# Patient Record
Sex: Female | Born: 1975 | Race: Black or African American | Hispanic: No | Marital: Single | Smoking: Never smoker
Health system: Southern US, Community
[De-identification: ages and names within clinical notes are randomized; demographics above are authoritative.]

## PROBLEM LIST (undated history)

## (undated) DIAGNOSIS — J45909 Unspecified asthma, uncomplicated: Secondary | ICD-10-CM

## (undated) DIAGNOSIS — M5430 Sciatica, unspecified side: Secondary | ICD-10-CM

## (undated) HISTORY — DX: Sciatica, unspecified side: M54.30

## (undated) HISTORY — PX: ABDOMINAL HYSTERECTOMY: SHX81

## (undated) HISTORY — PX: TONSILECTOMY/ADENOIDECTOMY WITH MYRINGOTOMY: SHX6125

## (undated) HISTORY — PX: DILATION AND CURETTAGE OF UTERUS: SHX78

## (undated) HISTORY — DX: Unspecified asthma, uncomplicated: J45.909

---

## 1998-03-04 HISTORY — PX: REDUCTION MAMMAPLASTY: SUR839

## 1998-03-04 HISTORY — PX: BREAST SURGERY: SHX581

## 2007-01-01 ENCOUNTER — Ambulatory Visit: Payer: Self-pay | Admitting: Internal Medicine

## 2007-03-04 ENCOUNTER — Emergency Department: Payer: Self-pay | Admitting: Emergency Medicine

## 2011-02-23 ENCOUNTER — Inpatient Hospital Stay: Payer: Self-pay

## 2011-05-01 LAB — HM PAP SMEAR: HM Pap smear: NORMAL

## 2012-04-30 ENCOUNTER — Ambulatory Visit (INDEPENDENT_AMBULATORY_CARE_PROVIDER_SITE_OTHER): Payer: BC Managed Care – PPO | Admitting: Internal Medicine

## 2012-04-30 ENCOUNTER — Encounter: Payer: Self-pay | Admitting: Internal Medicine

## 2012-04-30 VITALS — BP 110/76 | HR 68 | Temp 97.9°F | Ht 67.25 in | Wt 222.0 lb

## 2012-04-30 DIAGNOSIS — E669 Obesity, unspecified: Secondary | ICD-10-CM

## 2012-04-30 MED ORDER — PHENTERMINE HCL 37.5 MG PO CAPS: 37.5000 mg | ORAL_CAPSULE | ORAL | Status: DC

## 2012-04-30 NOTE — Assessment & Plan Note (Signed)
Body mass index is 34.52 kg/(m^2).  BMI 34. Discussed keeping a food diary. Discussed continuing with exercise.  Will start phentermine to help with appetite suppression. Discussed potential side effects of this medication. Goal weight loss 0.5-1lb per week. Follow up in 1 month.

## 2012-04-30 NOTE — Patient Instructions (Signed)
[email or web address] - app for weight loss

## 2012-04-30 NOTE — Progress Notes (Signed)
Subjective:    Patient ID: Alexis Suarez, female    DOB: 07-Feb-1976, 37 y.o.   MRN: [id number]  HPI 36YO female with h/o obesity presents to establish care. Doing well. Trying to follow healthy diet. Exercising 5 times per week with cardio and personal training. Was previously using phentermine with some improvement in appetite.  Otherwise well. Had some BRB on toilet paper a few months ago, which she attributed to hemorrhoid after pregnancy. No further bleeding, abdominal pain, or change in bowel habits.  Outpatient Encounter Prescriptions as of 04/30/2012  Medication Sig Dispense Refill  . Norgestimate-Ethinyl Estradiol Triphasic (ORTHO TRI-CYCLEN LO) 0.18/0.215/0.25 MG-25 MCG tab Take 1 tablet by mouth daily.      . phentermine 37.5 MG capsule Take 1 capsule (37.5 mg total) by mouth every morning.  30 capsule  1   No facility-administered encounter medications on file as of 04/30/2012.   BP 110/76  Pulse 68  Temp(Src) 97.9 F (36.6 C) (Oral)  Ht 5' 7.25" (1.708 m)  Wt 222 lb (100.699 kg)  BMI 34.52 kg/m2  SpO2 98%  LMP 04/09/2012  Review of Systems  Constitutional: Negative for fever, chills, appetite change, fatigue and unexpected weight change.  HENT: Negative for ear pain, congestion, sore throat, trouble swallowing, neck pain, voice change and sinus pressure.   Eyes: Negative for visual disturbance.  Respiratory: Negative for cough, shortness of breath, wheezing and stridor.   Cardiovascular: Negative for chest pain, palpitations and leg swelling.  Gastrointestinal: Negative for nausea, vomiting, abdominal pain, diarrhea, constipation, blood in stool, abdominal distention and anal bleeding.  Genitourinary: Negative for dysuria and flank pain.  Musculoskeletal: Negative for myalgias, arthralgias and gait problem.  Skin: Negative for color change and rash.  Neurological: Negative for dizziness and headaches.  Hematological: Negative for adenopathy. Does not bruise/bleed easily.   Psychiatric/Behavioral: Negative for suicidal ideas, sleep disturbance and dysphoric mood. The patient is not nervous/anxious.        Objective:   Physical Exam  Constitutional: She is oriented to person, place, and time. She appears well-developed and well-nourished. No distress.  HENT:  Head: Normocephalic and atraumatic.  Right Ear: External ear normal.  Left Ear: External ear normal.  Nose: Nose normal.  Mouth/Throat: Oropharynx is clear and moist. No oropharyngeal exudate.  Eyes: Conjunctivae are normal. Pupils are equal, round, and reactive to light. Right eye exhibits no discharge. Left eye exhibits no discharge. No scleral icterus.  Neck: Normal range of motion. Neck supple. No tracheal deviation present. No thyromegaly present.  Cardiovascular: Normal rate, regular rhythm, normal heart sounds and intact distal pulses.  Exam reveals no gallop and no friction rub.   No murmur heard. Pulmonary/Chest: Effort normal and breath sounds normal. No respiratory distress. She has no wheezes. She has no rales. She exhibits no tenderness.  Abdominal: Soft. Bowel sounds are normal. She exhibits no distension and no mass. There is no tenderness. There is no rebound and no guarding.  Musculoskeletal: Normal range of motion. She exhibits no edema and no tenderness.  Lymphadenopathy:    She has no cervical adenopathy.  Neurological: She is alert and oriented to person, place, and time. No cranial nerve deficit. She exhibits normal muscle tone. Coordination normal.  Skin: Skin is warm and dry. No rash noted. She is not diaphoretic. No erythema. No pallor.  Psychiatric: She has a normal mood and affect. Her behavior is normal. Judgment and thought content normal.          Assessment & Plan:

## 2012-10-11 ENCOUNTER — Emergency Department: Payer: Self-pay | Admitting: Internal Medicine

## 2012-10-11 LAB — URINALYSIS, COMPLETE
Bacteria: NONE SEEN
Bilirubin,UR: NEGATIVE
Glucose,UR: NEGATIVE mg/dL (ref 0–75)
Ketone: NEGATIVE
Nitrite: NEGATIVE
Protein: NEGATIVE
Specific Gravity: 1.019 (ref 1.003–1.030)
Squamous Epithelial: 6

## 2012-10-11 LAB — COMPREHENSIVE METABOLIC PANEL
Anion Gap: 3 — ABNORMAL LOW (ref 7–16)
Bilirubin,Total: 0.3 mg/dL (ref 0.2–1.0)
Calcium, Total: 8.4 mg/dL — ABNORMAL LOW (ref 8.5–10.1)
EGFR (African American): >60
Glucose: 101 mg/dL — ABNORMAL HIGH (ref 65–99)
Total Protein: 7.6 g/dL (ref 6.4–8.2)

## 2012-10-11 LAB — CBC
HCT: 36.8 % (ref 35.0–47.0)
MCH: 27.6 pg (ref 26.0–34.0)
MCV: 81 fL (ref 80–100)
Platelet: 256 10*3/uL (ref 150–440)
WBC: 10.5 10*3/uL (ref 3.6–11.0)

## 2013-01-07 ENCOUNTER — Other Ambulatory Visit: Payer: Self-pay

## 2013-03-04 HISTORY — PX: ABDOMINAL HYSTERECTOMY: SHX81

## 2013-12-15 ENCOUNTER — Ambulatory Visit: Payer: Self-pay | Admitting: Nurse Practitioner

## 2014-01-07 DIAGNOSIS — K5900 Constipation, unspecified: Secondary | ICD-10-CM | POA: Insufficient documentation

## 2014-01-18 LAB — HM PAP SMEAR: HM Pap smear: NORMAL

## 2014-01-28 ENCOUNTER — Ambulatory Visit: Payer: Self-pay | Admitting: Certified Nurse Midwife

## 2014-01-28 LAB — HCG, QUANTITATIVE, PREGNANCY: BETA HCG, QUANT.: 9911 m[IU]/mL — AB

## 2014-02-01 ENCOUNTER — Emergency Department: Payer: Self-pay | Admitting: Emergency Medicine

## 2014-02-03 ENCOUNTER — Ambulatory Visit: Payer: Self-pay | Admitting: Obstetrics & Gynecology

## 2014-02-08 ENCOUNTER — Inpatient Hospital Stay: Payer: Self-pay | Admitting: Obstetrics & Gynecology

## 2014-02-08 LAB — CBC WITH DIFFERENTIAL/PLATELET
BASOS ABS: 0 10*3/uL (ref 0.0–0.1)
BASOS ABS: 0 10*3/uL (ref 0.0–0.1)
Basophil %: 0.4 %
Basophil %: 0.1 %
Eosinophil #: 0.3 10*3/uL (ref 0.0–0.7)
Eosinophil #: 0 10*3/uL (ref 0.0–0.7)
Eosinophil %: 3.3 %
Eosinophil %: 0 %
HCT: 24.1 % — ABNORMAL LOW (ref 35.0–47.0)
HCT: 29 % — AB (ref 35.0–47.0)
HGB: 7.7 g/dL — AB (ref 12.0–16.0)
HGB: 9.3 g/dL — AB (ref 12.0–16.0)
LYMPHS ABS: 1.6 10*3/uL (ref 1.0–3.6)
Lymphocyte #: 2.6 10*3/uL (ref 1.0–3.6)
Lymphocyte %: 33.2 %
Lymphocyte %: 4.8 %
MCH: 27.3 pg (ref 26.0–34.0)
MCH: 28.4 pg (ref 26.0–34.0)
MCHC: 31.7 g/dL — AB (ref 32.0–36.0)
MCHC: 32.1 g/dL (ref 32.0–36.0)
MCV: 86 fL (ref 80–100)
MCV: 88 fL (ref 80–100)
MONO ABS: 0.5 x10 3/mm (ref 0.2–0.9)
MONOS PCT: 6.4 %
Monocyte #: 1.8 x10 3/mm — ABNORMAL HIGH (ref 0.2–0.9)
Monocyte %: 5.4 %
NEUTROS ABS: 4.4 10*3/uL (ref 1.4–6.5)
NEUTROS PCT: 89.7 %
Neutrophil #: 30.1 10*3/uL — ABNORMAL HIGH (ref 1.4–6.5)
Neutrophil %: 56.7 %
PLATELETS: 169 10*3/uL (ref 150–440)
PLATELETS: 259 10*3/uL (ref 150–440)
RBC: 2.8 10*6/uL — ABNORMAL LOW (ref 3.80–5.20)
RBC: 3.28 10*6/uL — AB (ref 3.80–5.20)
RDW: 14 % (ref 11.5–14.5)
RDW: 14.3 % (ref 11.5–14.5)
WBC: 7.8 10*3/uL (ref 3.6–11.0)
WBC: 33.6 10*3/uL — ABNORMAL HIGH (ref 3.6–11.0)

## 2014-02-08 LAB — FIBRINOGEN: FIBRINOGEN: 218 mg/dL (ref 210–470)

## 2014-02-08 LAB — COMPREHENSIVE METABOLIC PANEL
ALBUMIN: 2.4 g/dL — AB (ref 3.4–5.0)
ALT: 16 U/L
ANION GAP: 8 (ref 7–16)
AST: 23 U/L (ref 15–37)
Alkaline Phosphatase: 59 U/L
BUN: 10 mg/dL (ref 7–18)
Bilirubin,Total: 1.3 mg/dL — ABNORMAL HIGH (ref 0.2–1.0)
CALCIUM: 7.6 mg/dL — AB (ref 8.5–10.1)
CHLORIDE: 105 mmol/L (ref 98–107)
CREATININE: 0.9 mg/dL (ref 0.60–1.30)
Co2: 26 mmol/L (ref 21–32)
Glucose: 158 mg/dL — ABNORMAL HIGH (ref 65–99)
Osmolality: 280 (ref 275–301)
Potassium: 5.5 mmol/L — ABNORMAL HIGH (ref 3.5–5.1)
Sodium: 139 mmol/L (ref 136–145)
Total Protein: 4.9 g/dL — ABNORMAL LOW (ref 6.4–8.2)

## 2014-02-08 LAB — BASIC METABOLIC PANEL
ANION GAP: 6 — AB (ref 7–16)
BUN: 8 mg/dL (ref 7–18)
CHLORIDE: 107 mmol/L (ref 98–107)
CO2: 24 mmol/L (ref 21–32)
Calcium, Total: 7.2 mg/dL — ABNORMAL LOW (ref 8.5–10.1)
Creatinine: 0.7 mg/dL (ref 0.60–1.30)
EGFR (African American): >60
EGFR (Non-African Amer.): >60
Glucose: 231 mg/dL — ABNORMAL HIGH (ref 65–99)
OSMOLALITY: 280 (ref 275–301)
POTASSIUM: 3.8 mmol/L (ref 3.5–5.1)
Sodium: 137 mmol/L (ref 136–145)

## 2014-02-08 LAB — HEMATOCRIT: HCT: 35.9 % (ref 35.0–47.0)

## 2014-02-08 LAB — PROTIME-INR
INR: 1.3
Prothrombin Time: 15.7 secs — ABNORMAL HIGH (ref 11.5–14.7)

## 2014-02-08 LAB — HEMOGLOBIN: HGB: 11.5 g/dL — ABNORMAL LOW (ref 12.0–16.0)

## 2014-02-09 LAB — CBC WITH DIFFERENTIAL/PLATELET
Basophil #: 0 10*3/uL (ref 0.0–0.1)
Basophil #: 0.1 10*3/uL (ref 0.0–0.1)
Basophil %: 0.1 %
Basophil %: 0.3 %
EOS ABS: 0 10*3/uL (ref 0.0–0.7)
EOS PCT: 0.1 %
Eosinophil #: 0 10*3/uL (ref 0.0–0.7)
Eosinophil %: 0.2 %
HCT: 21.6 % — ABNORMAL LOW (ref 35.0–47.0)
HCT: 26.1 % — ABNORMAL LOW (ref 35.0–47.0)
HGB: 7.2 g/dL — ABNORMAL LOW (ref 12.0–16.0)
HGB: 8.6 g/dL — AB (ref 12.0–16.0)
LYMPHS ABS: 2.4 10*3/uL (ref 1.0–3.6)
LYMPHS PCT: 13.6 %
Lymphocyte #: 1.7 10*3/uL (ref 1.0–3.6)
Lymphocyte %: 8.4 %
MCH: 29 pg (ref 26.0–34.0)
MCH: 29.1 pg (ref 26.0–34.0)
MCHC: 32.8 g/dL (ref 32.0–36.0)
MCHC: 33.5 g/dL (ref 32.0–36.0)
MCV: 87 fL (ref 80–100)
MCV: 89 fL (ref 80–100)
MONO ABS: 1.3 x10 3/mm — AB (ref 0.2–0.9)
Monocyte #: 1.4 x10 3/mm — ABNORMAL HIGH (ref 0.2–0.9)
Monocyte %: 6.5 %
Monocyte %: 7.9 %
NEUTROS ABS: 17.5 10*3/uL — AB (ref 1.4–6.5)
NEUTROS PCT: 78.2 %
Neutrophil #: 13.6 10*3/uL — ABNORMAL HIGH (ref 1.4–6.5)
Neutrophil %: 84.7 %
Platelet: 186 10*3/uL (ref 150–440)
Platelet: 224 10*3/uL (ref 150–440)
RBC: 2.49 10*6/uL — ABNORMAL LOW (ref 3.80–5.20)
RBC: 2.95 10*6/uL — ABNORMAL LOW (ref 3.80–5.20)
RDW: 14.5 % (ref 11.5–14.5)
RDW: 14.6 % — ABNORMAL HIGH (ref 11.5–14.5)
WBC: 17.4 10*3/uL — ABNORMAL HIGH (ref 3.6–11.0)
WBC: 20.6 10*3/uL — ABNORMAL HIGH (ref 3.6–11.0)

## 2014-02-09 LAB — COMPREHENSIVE METABOLIC PANEL
ALBUMIN: 2.6 g/dL — AB (ref 3.4–5.0)
ANION GAP: 5 — AB (ref 7–16)
AST: 29 U/L (ref 15–37)
Albumin: 2.4 g/dL — ABNORMAL LOW (ref 3.4–5.0)
Alkaline Phosphatase: 48 U/L
Alkaline Phosphatase: 55 U/L
Anion Gap: 8 (ref 7–16)
BILIRUBIN TOTAL: 1 mg/dL (ref 0.2–1.0)
BUN: 7 mg/dL (ref 7–18)
BUN: 8 mg/dL (ref 7–18)
Bilirubin,Total: 0.7 mg/dL (ref 0.2–1.0)
CALCIUM: 7.3 mg/dL — AB (ref 8.5–10.1)
CHLORIDE: 104 mmol/L (ref 98–107)
CO2: 26 mmol/L (ref 21–32)
CREATININE: 0.75 mg/dL (ref 0.60–1.30)
Calcium, Total: 7.5 mg/dL — ABNORMAL LOW (ref 8.5–10.1)
Chloride: 104 mmol/L (ref 98–107)
Co2: 29 mmol/L (ref 21–32)
Creatinine: 0.91 mg/dL (ref 0.60–1.30)
EGFR (African American): >60
EGFR (Non-African Amer.): >60
EGFR (Non-African Amer.): >60
Glucose: 133 mg/dL — ABNORMAL HIGH (ref 65–99)
Glucose: 164 mg/dL — ABNORMAL HIGH (ref 65–99)
Osmolality: 276 (ref 275–301)
Osmolality: 278 (ref 275–301)
Potassium: 4.3 mmol/L (ref 3.5–5.1)
Potassium: 5 mmol/L (ref 3.5–5.1)
SGOT(AST): 32 U/L (ref 15–37)
SGPT (ALT): 18 U/L
SGPT (ALT): 18 U/L
SODIUM: 138 mmol/L (ref 136–145)
Sodium: 138 mmol/L (ref 136–145)
TOTAL PROTEIN: 4.8 g/dL — AB (ref 6.4–8.2)
Total Protein: 5.2 g/dL — ABNORMAL LOW (ref 6.4–8.2)

## 2014-02-09 LAB — FIBRINOGEN: Fibrinogen: 322 mg/dL (ref 210–470)

## 2014-02-09 LAB — HCG, QUANTITATIVE, PREGNANCY: Beta Hcg, Quant.: 400 m[IU]/mL — ABNORMAL HIGH

## 2014-02-09 LAB — HEMATOCRIT
HCT: 20.3 % — ABNORMAL LOW (ref 35.0–47.0)
HCT: 20.1 % — ABNORMAL LOW (ref 35.0–47.0)

## 2014-02-09 LAB — HEMOGLOBIN
HGB: 6.7 g/dL — AB (ref 12.0–16.0)
HGB: 6.7 g/dL — AB (ref 12.0–16.0)

## 2014-02-09 LAB — PROTIME-INR
INR: 1
PROTHROMBIN TIME: 13.5 s (ref 11.5–14.7)

## 2014-02-10 LAB — CBC WITH DIFFERENTIAL/PLATELET
BASOS ABS: 0 10*3/uL (ref 0.0–0.1)
BASOS PCT: 0.2 %
EOS PCT: 1 %
Eosinophil #: 0.2 10*3/uL (ref 0.0–0.7)
HCT: 25 % — ABNORMAL LOW (ref 35.0–47.0)
HGB: 8.2 g/dL — ABNORMAL LOW (ref 12.0–16.0)
LYMPHS PCT: 10.6 %
Lymphocyte #: 1.6 10*3/uL (ref 1.0–3.6)
MCH: 28.4 pg (ref 26.0–34.0)
MCHC: 32.7 g/dL (ref 32.0–36.0)
MCV: 87 fL (ref 80–100)
MONO ABS: 0.9 x10 3/mm (ref 0.2–0.9)
Monocyte %: 5.9 %
NEUTROS PCT: 82.3 %
Neutrophil #: 12.7 10*3/uL — ABNORMAL HIGH (ref 1.4–6.5)
Platelet: 125 10*3/uL — ABNORMAL LOW (ref 150–440)
RBC: 2.87 10*6/uL — AB (ref 3.80–5.20)
RDW: 14.4 % (ref 11.5–14.5)
WBC: 15.4 10*3/uL — ABNORMAL HIGH (ref 3.6–11.0)

## 2014-02-10 LAB — BASIC METABOLIC PANEL
ANION GAP: 6 — AB (ref 7–16)
BUN: 5 mg/dL — AB (ref 7–18)
CALCIUM: 7.9 mg/dL — AB (ref 8.5–10.1)
CHLORIDE: 103 mmol/L (ref 98–107)
CO2: 30 mmol/L (ref 21–32)
Creatinine: 0.75 mg/dL (ref 0.60–1.30)
EGFR (African American): >60
EGFR (Non-African Amer.): >60
Glucose: 76 mg/dL (ref 65–99)
OSMOLALITY: 274 (ref 275–301)
POTASSIUM: 3.8 mmol/L (ref 3.5–5.1)
Sodium: 139 mmol/L (ref 136–145)

## 2014-02-10 LAB — HEMOGLOBIN: HGB: 8.4 g/dL — ABNORMAL LOW (ref 12.0–16.0)

## 2014-02-15 ENCOUNTER — Ambulatory Visit: Payer: Self-pay | Admitting: Nurse Practitioner

## 2014-05-08 ENCOUNTER — Emergency Department: Payer: Self-pay | Admitting: Emergency Medicine

## 2014-06-25 NOTE — Op Note (Signed)
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  POSTOPERATIVE DIAGNOSES:  1.  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 2.  Massive uterine hemorrhage.   PROCEDURE: 1.  Suction dilation and curettage. 2.  Total abdominal hysterectomy. 3.  Bilateral salpingectomy. 4.  Cystoscopy.  SURGEON:  Farhaan Mabee C. Tyrece Vanterpool, MD  ASSISTANT: Will Bonnet, MD  ANESTHESIA:  General via LMA converted to endotracheal  FLUIDS IN:  5 liters crystalloid, 80 units oxytocin, 4 units packed red blood cells, 1 unit fresh frozen plasma, 1 unit platelets.    ESTIMATED BLOOD LOSS:  5 liters.   URINE OUTPUT:  400 mL clear yellow fluid.   SPECIMENS:  1.  Products of conception. 2.  Productions of conception for cytogenetic analysis. 3.  Uterus and cervix. 4.  Left tube. 5.  Right tube.  FINDINGS: 1.  Massive bleeding upon entry into the uterine cavity. 2.  Products of conception. 3.  Normal appearing tubes and ovaries. 4.  Uterus with a band of scar tissue connected to the bladder anteriorly. 5.  No bladder injury on cystoscopy and bilateral ureteral jets.  COMPLICATIONS:  5 Liter EBL.  No operative complications apparent.  DISPOSITION:  Extubated in the OR on phenylephrine for blood pressure support to the PACU, awaiting CCU admission.   INDICATIONS FOR PROCEDURE:  The patient is a 39 year old G2, P1-0-0-1 at 10 weeks by last menstrual period with a known missed abortion with embryo measuring 8 weeks and no cardiac activity as diagnosed via ultrasound 1 week ago.  She was seen as an outpatient and prior to this diagnosis was also seen on ultrasound some abnormal findings that raised the concern for a  partial molar pregnancy given that there was also an embryo.  She was counseled in the office that if this were indeed a partial molar pregnancy that the blood  loss could be large during the procedure and was also counseled that there would potentially be complications to the procedure that might include embolization of the artery of the uterus as well as a hysterectomy.  The patient was scheduled as an outpatient surgery for 02/08/2014 and was instructed to report immediately to the Emergency Room should she experience any bleeding prior to this.    DESCRIPTION OF PROCEDURE: The patient presented on day of surgery with informed consent confirmed. She was brought to the operating room where she was identified as Alexis Suarez.  She was given an LMA with general anesthesia and placed in the dorsal lithotomy position in candy cane stirrups.  She was prepped vaginally with Betadine and draped in the usual sterile fashion.  A timeout was called.  A weighted speculum was placed into the vagina and then 10 mL of 1% lidocaine was injected for a paracervical block at the 4 and 8 o'clock position, 5 mL each.  A single-tooth tenaculum was used to grasp the cervix at the 12 o'clock position and then  sequential Pratt dilators were used to dilate the cervix in order to accommodate an 8 French curved suction curette.  This was placed into the uterine cavity and immediately upon entry, a large amount of blood was expressed.  After the blood, clear fluid was seen indicating the amniotomy had been achieved.  The curette was hooked up to suction and evacuation of uterine contents was performed.  An amniotic sac with fetal contents within it  was removed to the cervix and ringed forceps were then used to tease out the specimen and place it aside to be sent to pathology.  The bleeding continued to be very brisk and gentle sharp curettage was performed with quite a bit of tissue removed from the anterior and right side of the uterus.  Throughout the procedure, there was no palpable evidence of a breach of the uterine cavity; however, perforation could not be ruled out.   At this time I  asked Dr. Glennon Mac to join me in the operating room with an ultrasound in order to  visualize the uterus and my instrumentation within it.  Unfortunately, our first ultrasound did not show abdominal contents due to lack of depth from the transducer and the machine. We retrieved another ultrasound, but this was not intended to be used in the abdominal cavity.  However, we were able to see the uterus and my instrument within it at certain times and it did appear to be a normal-sized uterus with no extreme hypoechoic or hyperechoic contents within it and instrumentation was seen.  There was no evidence of fluid collection above or below the uterus.  We abandoned the ultrasound machine due to poor visualization and attempted to call interventional radiology to see if they would be able to consider a stat IR embolization of bilateral uterine arteries.  We were placed on hold for over 1 minute.  During this time, the massive transfusion protocol was called as the patient had already lost 3500 mL of blood and no decline of the hemorrhage was visualized.  I placed a hand on the abdomen as well as through the vagina to tamponade the uterus and fundal pressure was applied for 3 minutes straight. Following this, the release of the tissue showed again a large amount of hemorrhage from the uterus. At this time, blood loss was 4000 mL and I decided to proceed with an emergency abdominal hysterectomy.   The vagina was packed with sponges and the patient was released from the candy cane stirrups and put into Allen stirrups that were attached to the bed so to be in low lithotomy position. A Foley catheter was placed and she was prepped on the abdomen with Betadine and 2 grams of Ancef were given to the patient.  The massive transfusion protocol continued.    A midline incision was made at the site of her former scar from her previous cesarean from the pubic symphysis to periumbilical region. This was brought down to the fascia  using the knife and once the fascia was identified, it was divided with the Bovie and the abdominal cavity was entered.  The peritoneum was identified and surgeon's finger was used to divide it and then we were into the abdominal cavity. At first we manually brought the uterus to the surface so that we could inspect it for site of perforation of which there was none.  There was no blood in the abdominal cavity.  I grasped the uterus and held pressure for some time while we assembled the Bookwalter retractor.  This was assembled to the bed, and the bowel was retracted out of the pelvis and retractors were placed for adequate visualization of the pelvic contents. There was an area of scar tissue upon entry where the bladder and the uterus had scarred together. I divided this area closer to the uterus in order to err on the side of the uterine anatomy being left behind rather than the bladder being entered. This did  create an approximately a centimeter-sized defect within the uterine serosa.  Please note this was not there prior to opening the abdominal cavity.  Two Heaney clamps were placed at the bilateral corunae and the adnexa were divided from the uterus.  The bovie was used to divide the vesicouterine peritoneum and the bladder was dropped off of the cervix and vagina, out of the surgical field.  Sequential straight Heaney clamps were then placed bilaterally along the medial borders of the uterus down to the cervix which each clamp followed with a suture and tie for hemostasis of the parametrial tissues.  The vagina was entered with the last clamp and Jorgensen scissors were then used to cut circumferentially around the cervix for a colpotomy.  The vaginal tissues were then grasped with Allis clamps and the cuff was closed with sequential figure-of-eights of 0 Vicryl.  There were some bleeders along the right adnexa consistent with uterine artery.  These were addressed with figure-of-eights of 3-0 Vicryl.  Due to  the placement of these stiches, the peritoneum of the side wall was opened and the ureter was traced from its entry into the pelvis at the common right iliac artery past the area of suture and found to be uninvolved. Once hemostasis was noted in the surgical field, the attention was drawn to the adnexa where the fallopian tubes were raised with a Babcock and the mesosalpinx was clamped with a Heaney clamp.  The mesosalpinx was divided and the bilateral tubes were then handed off to nursing as left tube and right tube.  Each remaining mesosalpinx was then sutured with a Heaney stitch and tied down.  The bilateral areas were found to be hemostatic.  Parametrial bleeders were addressed with either the Bovie cauterization or with figure-of-eights of 3-0 Vicryl.  Irrigation with water was performed and the areas were found to be hemostatic.  Due to the scarring of the bladder to the uterus due to her previous C-section, I performed a cystoscopy in which I noted that there was no defect in the bladder wall and both ureteral orifices were both patent and expelling jets of urine.    After this, the bladder was then drained, the Foley was replaced and the attention was turned back to the abdominal cavity, where all instruments were removed and all lap sponges were removed and counts were correct prior to closing the fascia. The fascia was closed with 2 opposing stitches of looped PDS that met in the midline and were tied together. Upon tying them together 1 of the suture ends broke and although the 2 ends were all tied together, I placed another figure-of-eight of 0 Maxon through the fascia and around the suture just for security.  The subcutaneous tissues were then irrigated with warm saline and Bovie cauterization was used for hemostasis when necessary; 3-0 Vicryl was then used for reapproximation of this tissue layer and then the skin was closed using 4-0 Monocryl.  Steri-Strips were then placed and then sterile  dressing was secured.  The Foley remained intact.   The patient had at one point during the procedure was receiving both Levophed and phenylephrine. The Levophed was weaned prior to the end of the procedure and she went to the PACU in a stable but supported condition on phenylephrine. She will be transferred to the CCU for close observation until which point the patient is determined to be stable for transfer to the postoperative floor. The patient did tolerate the procedure.  The counts were correct  x 2 and the patient was brought to PACU as described.    ____________________________ Honor Loh Ephram Kornegay, MD ccw:LT D: 02/08/2014 14:54:45 ET T: 02/08/2014 17:25:33 ET JOB#: [id number]  cc: Vikki Ports C. Micael Barb, MD, <Dictator> Maceo Pro MD ELECTRONICALLY SIGNED 02/10/2014 9:35

## 2014-06-27 LAB — SURGICAL PATHOLOGY

## 2015-04-06 ENCOUNTER — Ambulatory Visit (INDEPENDENT_AMBULATORY_CARE_PROVIDER_SITE_OTHER): Payer: BLUE CROSS/BLUE SHIELD | Admitting: Internal Medicine

## 2015-04-06 ENCOUNTER — Encounter: Payer: Self-pay | Admitting: Internal Medicine

## 2015-04-06 VITALS — BP 112/76 | HR 64 | Temp 98.7°F | Ht 67.5 in | Wt 228.0 lb

## 2015-04-06 DIAGNOSIS — R21 Rash and other nonspecific skin eruption: Secondary | ICD-10-CM | POA: Diagnosis not present

## 2015-04-06 DIAGNOSIS — E669 Obesity, unspecified: Secondary | ICD-10-CM

## 2015-04-06 MED ORDER — TRIAMCINOLONE ACETONIDE 0.1 % EX CREA: 1.0000 "application " | TOPICAL_CREAM | Freq: Two times a day (BID) | CUTANEOUS | Status: DC

## 2015-04-06 NOTE — Patient Instructions (Signed)

## 2015-04-06 NOTE — Progress Notes (Signed)
Pre visit review using our clinic review tool, if applicable. No additional management support is needed unless otherwise documented below in the visit note. 

## 2015-04-06 NOTE — Assessment & Plan Note (Signed)
She is actually doing really well with her diet and exercise I don't think Phentermine will be beneficial Continue to work with Physiological scientist

## 2015-04-06 NOTE — Progress Notes (Signed)
HPI  Pt presents to the clinic today to establish care and for management of the conditions listed below. She is transferring care from [place].  Childhood Asthma: She has not had issues as an adult.  She reports a rash on her right forearm. She first noticed this about 2 weeks ago. It went away and then it came back about 4 days ago. The rash is very itchy. She thinks it may be coming from a Heeia she put on. She has never had a rash like this in the past. She has not tried anything OTC.  Obesity: She reports she has lost 8 lbs in the last month.  Breakfast: Protein shake Lunch: Salad, yogurt or grilled meat Dinner: Salad, yogurt or grilled meat Snack: Yogurt, pretzels, popcorn She drinks mostly water, orange and apple juice Exercise: She exercises with a personal trainer for 30 minutes, 2 x week. Goes to gyn 2 x week outside outside the personal trainer. She walks for 3 miles every Saturday and Sunday.  She is interested in getting something to help make her lose weight faster.  Flu: never Tetanus: 2012 Pap Smear: 02/2014- partial hysterectomy Colon Screening: 2016- normal Dentist: annually   Past Medical History  Diagnosis Date  . Asthma     ED eval in the past    No current outpatient prescriptions on file.   No current facility-administered medications for this visit.    No Known Allergies  Family History  Problem Relation Age of Onset  . Thyroid disease Mother   . Hypertension Sister   . Miscarriages / Stillbirths Sister   . Stroke Sister   . Prostate cancer Paternal Grandfather     Social History   Social History  . Marital Status: Single    Spouse Name: N/A  . Number of Children: N/A  . Years of Education: N/A   Occupational History  . Not on file.   Social History Main Topics  . Smoking status: Never Smoker   . Smokeless tobacco: Never Used  . Alcohol Use: 0.0 oz/week    0 Standard drinks or equivalent per week   Comment: Rare  . Drug Use: No  . Sexual Activity: Not on file   Other Topics Concern  . Not on file   Social History Narrative   Lives in [place] with mother, husband, and son, 18 months. Dog.      Work - Insurance underwriter, Product/process development scientist, counselor      Diet - regular diet      Exercise - Physiological scientist, Financial controller, at [employer], [employer]    ROS:  Constitutional: Denies fever, malaise, fatigue, headache or abrupt weight changes.  HEENT: Denies eye pain, eye redness, ear pain, ringing in the ears, wax buildup, runny nose, nasal congestion, bloody nose, or sore throat. Respiratory: Denies difficulty breathing, shortness of breath, cough or sputum production.   Cardiovascular: Denies chest pain, chest tightness, palpitations or swelling in the hands or feet.  Gastrointestinal: Denies abdominal pain, bloating, constipation, diarrhea or blood in the stool.  GU: Denies frequency, urgency, pain with urination, blood in urine, odor or discharge. Musculoskeletal: Denies decrease in range of motion, difficulty with gait, muscle pain or joint pain and swelling.  Skin: Pt reports rash. Denies redness, lesions or ulcercations.  Neurological: Denies dizziness, difficulty with memory, difficulty with speech or problems with balance and coordination.  Psych: Denies anxiety, depression, SI/HI.  No other specific complaints in a complete review of systems (except as listed  in HPI above).  PE:  BP 112/76 mmHg  Pulse 64  Temp(Src) 98.7 F (37.1 C) (Oral)  Ht 5' 7.5" (1.715 m)  Wt 228 lb (103.42 kg)  BMI 35.16 kg/m2  LMP 04/09/2012  Wt Readings from Last 3 Encounters:  04/06/15 228 lb (103.42 kg)  04/30/12 222 lb (100.699 kg)    General: Appears her stated age, obese in NAD. Skin: Raised, hyperpigmented maculopapular rash noted right forearm. Cardiovascular: Normal rate and rhythm. S1,S2 noted.  No murmur, rubs or gallops noted.  Pulmonary/Chest: Normal effort and positive vesicular breath  sounds. No respiratory distress. No wheezes, rales or ronchi noted.  Neurological: Alert and oriented.   Psychiatric: Mood and affect normal. Behavior is normal. Judgment and thought content normal.     BMET    Component Value Date/Time   NA 139 02/10/2014 0516   K 3.8 02/10/2014 0516   CL 103 02/10/2014 0516   CO2 30 02/10/2014 0516   GLUCOSE 76 02/10/2014 0516   BUN 5* 02/10/2014 0516   CREATININE 0.75 02/10/2014 0516   CALCIUM 7.9* 02/10/2014 0516   GFRNONAA >60 02/10/2014 0516   GFRNONAA >60 10/11/2012 1547   GFRAA >60 02/10/2014 0516   GFRAA >60 10/11/2012 1547    Lipid Panel  No results found for: CHOL, TRIG, HDL, CHOLHDL, VLDL, LDLCALC  CBC    Component Value Date/Time   WBC 15.4* 02/10/2014 0516   RBC 2.87* 02/10/2014 0516   HGB 8.4* 02/10/2014 1750   HCT 25.0* 02/10/2014 0516   PLT 125* 02/10/2014 0516   MCV 87 02/10/2014 0516   MCH 28.4 02/10/2014 0516   MCHC 32.7 02/10/2014 0516   RDW 14.4 02/10/2014 0516   LYMPHSABS 1.6 02/10/2014 0516   MONOABS 0.9 02/10/2014 0516   EOSABS 0.2 02/10/2014 0516   BASOSABS 0.0 02/10/2014 0516    Hgb A1C No results found for: HGBA1C   Assessment and Plan:  Rash:  eRx for Triamcinolone cream BID until resolved Do not use the [place] spray any longer  RTC as needed or if symptoms persist or worsen

## 2015-04-15 DIAGNOSIS — Z90710 Acquired absence of both cervix and uterus: Secondary | ICD-10-CM | POA: Insufficient documentation

## 2015-04-18 ENCOUNTER — Ambulatory Visit (INDEPENDENT_AMBULATORY_CARE_PROVIDER_SITE_OTHER): Payer: BLUE CROSS/BLUE SHIELD | Admitting: Internal Medicine

## 2015-04-18 ENCOUNTER — Encounter: Payer: Self-pay | Admitting: Internal Medicine

## 2015-04-18 VITALS — BP 118/82 | HR 80 | Temp 98.4°F | Wt 232.0 lb

## 2015-04-18 DIAGNOSIS — M545 Low back pain, unspecified: Secondary | ICD-10-CM

## 2015-04-18 DIAGNOSIS — T148 Other injury of unspecified body region: Secondary | ICD-10-CM | POA: Diagnosis not present

## 2015-04-18 DIAGNOSIS — T148XXA Other injury of unspecified body region, initial encounter: Secondary | ICD-10-CM

## 2015-04-18 MED ORDER — METHOCARBAMOL 500 MG PO TABS: 500.0000 mg | ORAL_TABLET | Freq: Every day | ORAL | Status: DC

## 2015-04-18 MED ORDER — NAPROXEN 500 MG PO TABS: 500.0000 mg | ORAL_TABLET | Freq: Two times a day (BID) | ORAL | Status: DC

## 2015-04-18 NOTE — Progress Notes (Signed)
Subjective:    Patient ID: Alexis Suarez, female    DOB: December 25, 1975, 40 y.o.   MRN: [id number]  HPI  Pt presents to the clinic today with c/o left leg pain. She reports this started on Saturday while she was out walking on the track. The pain starts in her left lower back and radiates into her left hip. She describes the pain as sharp and pulling. She denies numbness or tingling. The pain is worse when she changes positions. It is better with walking. She denies loss of bowel or bladder. She has taken leftover Norco with minimal relief. She denies any injury to her back. She reports she has had sciatica in the past and this feels different.  Review of Systems      Past Medical History  Diagnosis Date  . Asthma     ED eval in the past    Current Outpatient Prescriptions  Medication Sig Dispense Refill  . triamcinolone cream (KENALOG) 0.1 % Apply 1 application topically 2 (two) times daily. 30 g 0   No current facility-administered medications for this visit.    No Known Allergies  Family History  Problem Relation Age of Onset  . Thyroid disease Mother   . Hypertension Sister   . Miscarriages / Stillbirths Sister   . Stroke Sister   . Prostate cancer Paternal Grandfather   . Cancer Paternal Grandfather     Prostate  . Diabetes Father     Social History   Social History  . Marital Status: Single    Spouse Name: N/A  . Number of Children: N/A  . Years of Education: N/A   Occupational History  . Not on file.   Social History Main Topics  . Smoking status: Never Smoker   . Smokeless tobacco: Never Used  . Alcohol Use: 0.0 oz/week    0 Standard drinks or equivalent per week     Comment: Rare  . Drug Use: No  . Sexual Activity: Yes    Birth Control/ Protection: Surgical   Other Topics Concern  . Not on file   Social History Narrative   Lives in [place] with mother, husband, and son, 73 months. Dog.      Work - Insurance underwriter, Product/process development scientist, counselor       Diet - regular diet      Exercise - Physiological scientist, Financial controller, at [employer], [employer]     Constitutional: Denies fever, malaise, fatigue, headache or abrupt weight changes.  Gastrointestinal: Denies abdominal pain, bloating, constipation, diarrhea or blood in the stool.  GU: Denies urgency, frequency, pain with urination, burning sensation, blood in urine, odor or discharge. Musculoskeletal: Pt reports back pain. Denies difficulty with gait, or joint pain and swelling.  Skin: Denies redness, rashes, lesions or ulcercations.  Neurological: Denies dizziness, difficulty with memory, difficulty with speech or problems with balance and coordination.    No other specific complaints in a complete review of systems (except as listed in HPI above).  Objective:   Physical Exam BP 118/82 mmHg  Pulse 80  Temp(Src) 98.4 F (36.9 C) (Oral)  Wt 232 lb (105.235 kg)  LMP 04/09/2012 Wt Readings from Last 3 Encounters:  04/18/15 232 lb (105.235 kg)  04/06/15 228 lb (103.42 kg)  04/30/12 222 lb (100.699 kg)    General: Appears her stated age, obese in NAD. Musculoskeletal: Decreased flexion and extension. Normal rotation and lateral bending. No bony tenderness with palpation over the spine. Strength 5/5 BLE. Gait slow but  steady. Neurological: Alert and oriented. Sensation intact to BLE.  BMET    Component Value Date/Time   NA 139 02/10/2014 0516   K 3.8 02/10/2014 0516   CL 103 02/10/2014 0516   CO2 30 02/10/2014 0516   GLUCOSE 76 02/10/2014 0516   BUN 5* 02/10/2014 0516   CREATININE 0.75 02/10/2014 0516   CALCIUM 7.9* 02/10/2014 0516   GFRNONAA >60 02/10/2014 0516   GFRNONAA >60 10/11/2012 1547   GFRAA >60 02/10/2014 0516   GFRAA >60 10/11/2012 1547    Lipid Panel  No results found for: CHOL, TRIG, HDL, CHOLHDL, VLDL, LDLCALC  CBC    Component Value Date/Time   WBC 15.4* 02/10/2014 0516   RBC 2.87* 02/10/2014 0516   HGB 8.4* 02/10/2014 1750   HCT 25.0* 02/10/2014 0516    PLT 125* 02/10/2014 0516   MCV 87 02/10/2014 0516   MCH 28.4 02/10/2014 0516   MCHC 32.7 02/10/2014 0516   RDW 14.4 02/10/2014 0516   LYMPHSABS 1.6 02/10/2014 0516   MONOABS 0.9 02/10/2014 0516   EOSABS 0.2 02/10/2014 0516   BASOSABS 0.0 02/10/2014 0516    Hgb A1C No results found for: HGBA1C        Assessment & Plan:   Muscle strain of low back:  eRx for Naproxen 500 mg BID with meals eRx for Robaxin 500 mg QHS prn Stretching exercises given Avoid heavy lifting x 2 weeks Work note provided  RTC as needed or if symptoms persist or worsen

## 2015-04-18 NOTE — Patient Instructions (Signed)

## 2015-04-18 NOTE — Progress Notes (Signed)
Pre visit review using our clinic review tool, if applicable. No additional management support is needed unless otherwise documented below in the visit note. 

## 2015-04-19 ENCOUNTER — Telehealth: Payer: Self-pay | Admitting: Internal Medicine

## 2015-04-19 NOTE — Telephone Encounter (Signed)
Ok for work note? 

## 2015-04-19 NOTE — Telephone Encounter (Signed)
Pt stated the rx you gave her yesterday makes her sleepy and she took the day off.  She wanted to know if you could give her a note just for today  Please advise

## 2015-04-19 NOTE — Telephone Encounter (Signed)
Per pt request work excuse faxed to employer Attn:AK at [phone]

## 2015-04-22 ENCOUNTER — Encounter: Payer: Self-pay | Admitting: Emergency Medicine

## 2015-04-22 ENCOUNTER — Emergency Department
Admission: EM | Admit: 2015-04-22 | Discharge: 2015-04-22 | Disposition: A | Discharge status: Home / Self Care | Payer: BLUE CROSS/BLUE SHIELD | Attending: Emergency Medicine | Admitting: Emergency Medicine

## 2015-04-22 DIAGNOSIS — M5432 Sciatica, left side: Secondary | ICD-10-CM | POA: Diagnosis not present

## 2015-04-22 DIAGNOSIS — Z7952 Long term (current) use of systemic steroids: Secondary | ICD-10-CM | POA: Diagnosis not present

## 2015-04-22 DIAGNOSIS — M545 Low back pain: Secondary | ICD-10-CM | POA: Diagnosis present

## 2015-04-22 DIAGNOSIS — Z791 Long term (current) use of non-steroidal anti-inflammatories (NSAID): Secondary | ICD-10-CM | POA: Insufficient documentation

## 2015-04-22 DIAGNOSIS — Z79899 Other long term (current) drug therapy: Secondary | ICD-10-CM | POA: Insufficient documentation

## 2015-04-22 MED ORDER — DIAZEPAM 2 MG PO TABS
2.0000 mg | ORAL_TABLET | Freq: Once | ORAL | Status: AC
  Administered 2015-04-22: 2 mg via ORAL
  Filled 2015-04-22: qty 1

## 2015-04-22 MED ORDER — METHYLPREDNISOLONE 4 MG PO TBPK: ORAL_TABLET | ORAL | Status: DC

## 2015-04-22 MED ORDER — OXYCODONE-ACETAMINOPHEN 5-325 MG PO TABS: 1.0000 | ORAL_TABLET | ORAL | Status: DC | PRN

## 2015-04-22 MED ORDER — OXYCODONE-ACETAMINOPHEN 5-325 MG PO TABS
1.0000 | ORAL_TABLET | Freq: Once | ORAL | Status: AC
  Administered 2015-04-22: 1 via ORAL
  Filled 2015-04-22: qty 1

## 2015-04-22 MED ORDER — METHYLPREDNISOLONE SODIUM SUCC 125 MG IJ SOLR
80.0000 mg | Freq: Once | INTRAMUSCULAR | Status: AC
  Administered 2015-04-22: 80 mg via INTRAMUSCULAR
  Filled 2015-04-22: qty 2

## 2015-04-22 MED ORDER — DIAZEPAM 2 MG PO TABS: 2.0000 mg | ORAL_TABLET | Freq: Three times a day (TID) | ORAL | Status: DC | PRN

## 2015-04-22 NOTE — ED Provider Notes (Signed)
[facility] Emergency Department Provider Note  ____________________________________________  Time seen: Approximately 5:17 AM  I have reviewed the triage vital signs and the nursing notes.   HISTORY  Chief Complaint Back Pain    HPI Alexis Suarez is a 40 y.o. female presents to the ED from home with a chief complaint of lower back pain. Patient reports she was walking last Saturday and "stepped funny". Onset of lower back pain later that evening. Patient saw her PCP on Tuesday and was started on Naprosyn and methocarbamol. States she felt great the following day and ran errands as well as performed heavy housework. The following day she began experiencing left lower back pain with spasms. Sharp pain radiates to her left leg into the knee. Denies associated symptoms of leg weakness, bowel or bladder incontinence. Denies recent fever, chills, chest pain, shortness of breath, pain, nausea, vomiting, diarrhea. Denies recent travel or trauma. Nothing makes her pain better. Movement makes her pain worse.   Past Medical History  Diagnosis Date  . Asthma     ED eval in the past    Patient Active Problem List   Diagnosis Date Noted  . Obesity (BMI 30-39.9) 04/30/2012    Past Surgical History  Procedure Laterality Date  . Tonsilectomy/adenoidectomy with myringotomy    . Breast surgery  2000    breast reduction  . Cesarean section  2012  . Abdominal hysterectomy  2015    partial  . Dilation and curettage of uterus      Current Outpatient Rx  Name  Route  Sig  Dispense  Refill  . methocarbamol (ROBAXIN) 500 MG tablet   Oral   Take 1 tablet (500 mg total) by mouth at bedtime.   20 tablet   0   . naproxen (NAPROSYN) 500 MG tablet   Oral   Take 1 tablet (500 mg total) by mouth 2 (two) times daily with a meal.   60 tablet   0   . triamcinolone cream (KENALOG) 0.1 %   Topical   Apply 1 application topically 2 (two) times daily.   30 g   0      Allergies Review of patient's allergies indicates no known allergies.  Family History  Problem Relation Age of Onset  . Thyroid disease Mother   . Hypertension Sister   . Miscarriages / Stillbirths Sister   . Stroke Sister   . Prostate cancer Paternal Grandfather   . Cancer Paternal Grandfather     Prostate  . Diabetes Father     Social History Social History  Substance Use Topics  . Smoking status: Never Smoker   . Smokeless tobacco: Never Used  . Alcohol Use: 0.0 oz/week    0 Standard drinks or equivalent per week     Comment: Rare    Review of Systems Constitutional: No fever/chills Eyes: No visual changes. ENT: No sore throat. Cardiovascular: Denies chest pain. Respiratory: Denies shortness of breath. Gastrointestinal: No abdominal pain.  No nausea, no vomiting.  No diarrhea.  No constipation. Genitourinary: Negative for dysuria. Musculoskeletal: Positive for back pain. Skin: Negative for rash. Neurological: Negative for headaches, focal weakness or numbness.  10-point ROS otherwise negative.  ____________________________________________   PHYSICAL EXAM:  VITAL SIGNS: ED Triage Vitals  Enc Vitals Group     BP 04/22/15 0358 116/64 mmHg     Pulse Rate 04/22/15 0358 70     Resp 04/22/15 0358 18     Temp 04/22/15 0358 98.1 F (  36.7 C)     Temp Source 04/22/15 0358 Oral     SpO2 --      Weight 04/22/15 0358 229 lb (103.874 kg)     Height 04/22/15 0358 5\' 8"  (1.727 m)     Head Cir --      Peak Flow --      Pain Score 04/22/15 0358 9     Pain Loc --      Pain Edu? --      Excl. in [place]? --     Constitutional: Alert and oriented. Well appearing and in no acute distress. Eyes: Conjunctivae are normal. PERRL. EOMI. Head: Atraumatic. Nose: No congestion/rhinnorhea. Mouth/Throat: Mucous membranes are moist.  Oropharynx non-erythematous. Neck: No stridor.  No cervical spine tenderness to palpation. Cardiovascular: Normal rate, regular rhythm. Grossly  normal heart sounds.  Good peripheral circulation. Respiratory: Normal respiratory effort.  No retractions. Lungs CTAB. Gastrointestinal: Soft and nontender. No distention. No abdominal bruits. No CVA tenderness. Musculoskeletal: No midline spinal tenderness to palpation. Left lumbar paraspinal muscle spasms. Mild tenderness to palpation buttock. Positive straight leg raise on the left at 45. No lower extremity tenderness nor edema.  No joint effusions. Neurologic:  Normal speech and language. No gross focal neurologic deficits are appreciated.  Skin:  Skin is warm, dry and intact. No rash noted. Psychiatric: Mood and affect are normal. Speech and behavior are normal.  ____________________________________________   LABS (all labs ordered are listed, but only abnormal results are displayed)  Labs Reviewed - No data to display ____________________________________________  EKG  None ____________________________________________  RADIOLOGY  None ____________________________________________   PROCEDURES  Procedure(s) performed: None  Critical Care performed: No  ____________________________________________   INITIAL IMPRESSION / ASSESSMENT AND PLAN / ED COURSE  Pertinent labs & imaging results that were available during my care of the patient were reviewed by me and considered in my medical decision making (see chart for details).  40 year old female who presents with left lower back pain and spasms consistent with sciatica. Patient is requesting steroid taper as this helped her sciatica symptoms previously approximately 18 months ago. She has no neurological deficits on my exam. Discussed with patient to continue Naprosyn. Will substitute Valium for methocarbamol and add Percocet for stronger pain relief. Advised moist heat to affected area and close follow-up with PCP. Strict return precautions given. Patient verbalizes understanding and agrees with plan of  care. ____________________________________________   FINAL CLINICAL IMPRESSION(S) / ED DIAGNOSES  Final diagnoses:  Sciatica of left side      Paulette Blanch, MD 04/22/15 [phone]

## 2015-04-22 NOTE — Discharge Instructions (Signed)
1. You may take medicines as needed for pain and muscle spasms (Percocet/Valium #15). 2. Take steroid taper as prescribed (Medrol Dosepak). 3. Apply moist heat to affected area several times daily. 4. Return to the ER for worsening symptoms, persistent vomiting, numbness/tingling, bowel or bladder incontinence or other concerns.  Sciatica Sciatica is pain, weakness, numbness, or tingling along the path of the sciatic nerve. The nerve starts in the lower back and runs down the back of each leg. The nerve controls the muscles in the lower leg and in the back of the knee, while also providing sensation to the back of the thigh, lower leg, and the sole of your foot. Sciatica is a symptom of another medical condition. For instance, nerve damage or certain conditions, such as a herniated disk or bone spur on the spine, pinch or put pressure on the sciatic nerve. This causes the pain, weakness, or other sensations normally associated with sciatica. Generally, sciatica only affects one side of the body. CAUSES   Herniated or slipped disc.  Degenerative disk disease.  A pain disorder involving the narrow muscle in the buttocks (piriformis syndrome).  Pelvic injury or fracture.  Pregnancy.  Tumor (rare). SYMPTOMS  Symptoms can vary from mild to very severe. The symptoms usually travel from the low back to the buttocks and down the back of the leg. Symptoms can include:  Mild tingling or dull aches in the lower back, leg, or hip.  Numbness in the back of the calf or sole of the foot.  Burning sensations in the lower back, leg, or hip.  Sharp pains in the lower back, leg, or hip.  Leg weakness.  Severe back pain inhibiting movement. These symptoms may get worse with coughing, sneezing, laughing, or prolonged sitting or standing. Also, being overweight may worsen symptoms. DIAGNOSIS  Your caregiver will perform a physical exam to look for common symptoms of sciatica. He or she may ask you to  do certain movements or activities that would trigger sciatic nerve pain. Other tests may be performed to find the cause of the sciatica. These may include:  Blood tests.  X-rays.  Imaging tests, such as an MRI or CT scan. TREATMENT  Treatment is directed at the cause of the sciatic pain. Sometimes, treatment is not necessary and the pain and discomfort goes away on its own. If treatment is needed, your caregiver may suggest:  Over-the-counter medicines to relieve pain.  Prescription medicines, such as anti-inflammatory medicine, muscle relaxants, or narcotics.  Applying heat or ice to the painful area.  Steroid injections to lessen pain, irritation, and inflammation around the nerve.  Reducing activity during periods of pain.  Exercising and stretching to strengthen your abdomen and improve flexibility of your spine. Your caregiver may suggest losing weight if the extra weight makes the back pain worse.  Physical therapy.  Surgery to eliminate what is pressing or pinching the nerve, such as a bone spur or part of a herniated disk. HOME CARE INSTRUCTIONS   Only take over-the-counter or prescription medicines for pain or discomfort as directed by your caregiver.  Apply ice to the affected area for 20 minutes, 3-4 times a day for the first 48-72 hours. Then try heat in the same way.  Exercise, stretch, or perform your usual activities if these do not aggravate your pain.  Attend physical therapy sessions as directed by your caregiver.  Keep all follow-up appointments as directed by your caregiver.  Do not wear high heels or shoes that do  not provide proper support.  Check your mattress to see if it is too soft. A firm mattress may lessen your pain and discomfort. SEEK IMMEDIATE MEDICAL CARE IF:   You lose control of your bowel or bladder (incontinence).  You have increasing weakness in the lower back, pelvis, buttocks, or legs.  You have redness or swelling of your  back.  You have a burning sensation when you urinate.  You have pain that gets worse when you lie down or awakens you at night.  Your pain is worse than you have experienced in the past.  Your pain is lasting longer than 4 weeks.  You are suddenly losing weight without reason. MAKE SURE YOU:  Understand these instructions.  Will watch your condition.  Will get help right away if you are not doing well or get worse.   This information is not intended to replace advice given to you by your health care provider. Make sure you discuss any questions you have with your health care provider.   Document Released: 02/12/2001 Document Revised: 11/09/2014 Document Reviewed: 06/30/2011 Elsevier Interactive Patient Education [employer].

## 2015-04-22 NOTE — ED Notes (Addendum)
Pt states left sided lower back pain that radiates to left posterior hip since 04/16/2015. Pt states saw primary md on 04/18/2015 and was prescribed methocarbamol with relief of pain for one day. Pt states now has increased pain that is not responding to methocarbamol or motrin. Pt states history of sciatica that responded to steroids in past. Pt with cms intact to all toes. No swelling noted to lower extremities. Pt states pain is improved when she is sitting still and increases when she moves. Pt denies fever.

## 2015-04-22 NOTE — ED Notes (Signed)
Patient states that she was walking last Saturday and stepped funny and developed pain to her left lower back/hip. Patient states that she saw her pcp on Tuesday and was told it was probably a sprained muscle and started on pain medication. Patient states that this morning the pain became worse and started radiating down her left leg.

## 2015-04-24 ENCOUNTER — Ambulatory Visit: Payer: BLUE CROSS/BLUE SHIELD | Admitting: Internal Medicine

## 2015-04-25 ENCOUNTER — Ambulatory Visit (INDEPENDENT_AMBULATORY_CARE_PROVIDER_SITE_OTHER): Payer: BLUE CROSS/BLUE SHIELD | Admitting: Internal Medicine

## 2015-04-25 ENCOUNTER — Ambulatory Visit (INDEPENDENT_AMBULATORY_CARE_PROVIDER_SITE_OTHER)
Admission: RE | Admit: 2015-04-25 | Discharge: 2015-04-25 | Disposition: A | Discharge status: Home / Self Care | Payer: BLUE CROSS/BLUE SHIELD | Source: Ambulatory Visit | Attending: Internal Medicine | Admitting: Internal Medicine

## 2015-04-25 ENCOUNTER — Encounter: Payer: Self-pay | Admitting: Internal Medicine

## 2015-04-25 VITALS — BP 108/66 | HR 82 | Temp 98.1°F | Wt 230.0 lb

## 2015-04-25 DIAGNOSIS — M5442 Lumbago with sciatica, left side: Secondary | ICD-10-CM

## 2015-04-25 NOTE — Progress Notes (Signed)
Pre visit review using our clinic review tool, if applicable. No additional management support is needed unless otherwise documented below in the visit note. 

## 2015-04-25 NOTE — Patient Instructions (Signed)

## 2015-04-25 NOTE — Progress Notes (Signed)
Subjective:    Patient ID: Alexis Suarez, female    DOB: [birth year], 40 y.o.   MRN: [id number]  HPI  Pt presents to the clinic today for ER follow up of low back pain. She was seen here 2/14, and diagnosed with a muscle strain of her lower back. She was treated with Naproxen and Robaxin. She went to the ER 2/18 c/o worsening low back pain that radiated down her left leg. She denied issues with bowel and bladder. No xray of lumbar spine was performed. She was given Pred Taper, Valium and Percocet and advised to follow up with her PCP. She reports she has minimal relief with the treatment provided. She has also been using heat and ice. The pain is sharp and stabbing. It radiates down the left leg. She denies having numbness and tingling down her leg. The pain seems worse with position changes. She has had similar issues in the past, tried PT but reports it was not helpful.  Review of Systems      Past Medical History  Diagnosis Date  . Asthma     ED eval in the past    Current Outpatient Prescriptions  Medication Sig Dispense Refill  . diazepam (VALIUM) 2 MG tablet Take 1 tablet (2 mg total) by mouth every 8 (eight) hours as needed for muscle spasms. 15 tablet 0  . methocarbamol (ROBAXIN) 500 MG tablet Take 1 tablet (500 mg total) by mouth at bedtime. 20 tablet 0  . methylPREDNISolone (MEDROL DOSEPAK) 4 MG TBPK tablet Take as directed 21 tablet 0  . naproxen (NAPROSYN) 500 MG tablet Take 1 tablet (500 mg total) by mouth 2 (two) times daily with a meal. 60 tablet 0  . oxyCODONE-acetaminophen (ROXICET) 5-325 MG tablet Take 1 tablet by mouth every 4 (four) hours as needed for severe pain. 15 tablet 0  . triamcinolone cream (KENALOG) 0.1 % Apply 1 application topically 2 (two) times daily. 30 g 0   No current facility-administered medications for this visit.    No Known Allergies  Family History  Problem Relation Age of Onset  . Thyroid disease Mother   . Hypertension Sister   .  Miscarriages / Stillbirths Sister   . Stroke Sister   . Prostate cancer Paternal Grandfather   . Cancer Paternal Grandfather     Prostate  . Diabetes Father     Social History   Social History  . Marital Status: Single    Spouse Name: N/A  . Number of Children: N/A  . Years of Education: N/A   Occupational History  . Not on file.   Social History Main Topics  . Smoking status: Never Smoker   . Smokeless tobacco: Never Used  . Alcohol Use: 0.0 oz/week    0 Standard drinks or equivalent per week     Comment: Rare  . Drug Use: No  . Sexual Activity: Not on file   Other Topics Concern  . Not on file   Social History Narrative   Lives in [place] with mother, husband, and son, 13 months. Dog.      Work - Insurance underwriter, Product/process development scientist, counselor      Diet - regular diet      Exercise - Physiological scientist, Financial controller, at [employer], [employer]     Constitutional: Denies fever, malaise, fatigue, headache or abrupt weight changes.  Gastrointestinal: Denies abdominal pain, bloating, constipation, diarrhea or blood in the stool.  GU: Denies urgency, frequency, pain with urination, burning  sensation, blood in urine, odor or discharge. Musculoskeletal: Pt reports back pain. Denies difficulty with gait, or joint pain and swelling.  Skin: Denies redness, rashes, lesions or ulcercations.  Neurological: Denies dizziness, difficulty with memory, difficulty with speech or problems with balance and coordination.   No other specific complaints in a complete review of systems (except as listed in HPI above).  Objective:   Physical Exam   BP 108/66 mmHg  Pulse 82  Temp(Src) 98.1 F (36.7 C) (Oral)  Wt 230 lb (104.327 kg)  LMP 04/09/2012 Wt Readings from Last 3 Encounters:  04/25/15 230 lb (104.327 kg)  04/22/15 229 lb (103.874 kg)  04/18/15 232 lb (105.235 kg)    General: Appears her stated age, obese in NAD. Musculoskeletal: Decreased flexion and extension. Normal rotation and  lateral bending. No bony tenderness with palpation over the spine. Strength 5/5 BLE. Gait slow but steady. Neurological: Alert and oriented. Negative SLR on the left.Sensation intact to BLE.   BMET    Component Value Date/Time   NA 139 02/10/2014 0516   K 3.8 02/10/2014 0516   CL 103 02/10/2014 0516   CO2 30 02/10/2014 0516   GLUCOSE 76 02/10/2014 0516   BUN 5* 02/10/2014 0516   CREATININE 0.75 02/10/2014 0516   CALCIUM 7.9* 02/10/2014 0516   GFRNONAA >60 02/10/2014 0516   GFRNONAA >60 10/11/2012 1547   GFRAA >60 02/10/2014 0516   GFRAA >60 10/11/2012 1547    Lipid Panel  No results found for: CHOL, TRIG, HDL, CHOLHDL, VLDL, LDLCALC  CBC    Component Value Date/Time   WBC 15.4* 02/10/2014 0516   RBC 2.87* 02/10/2014 0516   HGB 8.4* 02/10/2014 1750   HCT 25.0* 02/10/2014 0516   PLT 125* 02/10/2014 0516   MCV 87 02/10/2014 0516   MCH 28.4 02/10/2014 0516   MCHC 32.7 02/10/2014 0516   RDW 14.4 02/10/2014 0516   LYMPHSABS 1.6 02/10/2014 0516   MONOABS 0.9 02/10/2014 0516   EOSABS 0.2 02/10/2014 0516   BASOSABS 0.0 02/10/2014 0516    Hgb A1C No results found for: HGBA1C      Assessment & Plan:   Midline low back pain with left side sciatica:  Hospital notes reviewed Will obtain xray of lumbar spine May need lumbar MRI Continue Naproxen, Valium and Percocet only for severe pain Will not be refilling her Percocet She is refusing PT, home exercises given  Will follow up after xray is back  RTC as needed or if symptoms persist or worsen

## 2015-05-02 ENCOUNTER — Telehealth: Payer: Self-pay

## 2015-05-02 NOTE — Telephone Encounter (Signed)
Pt wants to proceed with the MRI---pt states the muscle relaxers and Naproxen are really not helping however the using the heating pad give short term relief--pt states she has been staying well hydrated as well...pt request if you have any other suggestions to what she should do or take in the meantime

## 2015-05-02 NOTE — Telephone Encounter (Signed)
Pt left v/m; pt was seen 04/25/15; pt has taken prescribed med; now pt is having same pain with leg cramps in left leg that pt was having when seen. Pt wants to know what else can be done. Can med be called in or does pt need to have another appt. Pt request cb. [facility] [address]

## 2015-05-02 NOTE — Telephone Encounter (Signed)
Does she want to proceed with PT or MRI?

## 2015-05-03 ENCOUNTER — Ambulatory Visit: Payer: BLUE CROSS/BLUE SHIELD | Admitting: Internal Medicine

## 2015-05-03 ENCOUNTER — Encounter: Payer: Self-pay | Admitting: Internal Medicine

## 2015-05-03 ENCOUNTER — Other Ambulatory Visit: Payer: Self-pay | Admitting: Internal Medicine

## 2015-05-03 DIAGNOSIS — M5432 Sciatica, left side: Secondary | ICD-10-CM

## 2015-05-03 NOTE — Telephone Encounter (Signed)
She has failed steroids, muscle relaxers, NSAIDS and narcotics. She should continue heating pad and stretching. I will place order for MRI.

## 2015-05-05 ENCOUNTER — Encounter: Payer: Self-pay | Admitting: Internal Medicine

## 2015-05-05 ENCOUNTER — Other Ambulatory Visit: Payer: Self-pay | Admitting: Internal Medicine

## 2015-05-05 MED ORDER — PREDNISONE 10 MG PO TABS: ORAL_TABLET | ORAL | Status: DC

## 2015-05-08 ENCOUNTER — Encounter: Payer: Self-pay | Admitting: Internal Medicine

## 2015-05-10 ENCOUNTER — Ambulatory Visit (HOSPITAL_COMMUNITY): Payer: BLUE CROSS/BLUE SHIELD

## 2015-05-15 ENCOUNTER — Encounter: Payer: Self-pay | Admitting: Internal Medicine

## 2015-05-17 ENCOUNTER — Other Ambulatory Visit: Payer: Self-pay | Admitting: Internal Medicine

## 2015-05-17 DIAGNOSIS — R202 Paresthesia of skin: Secondary | ICD-10-CM

## 2015-06-12 ENCOUNTER — Ambulatory Visit (INDEPENDENT_AMBULATORY_CARE_PROVIDER_SITE_OTHER): Payer: BLUE CROSS/BLUE SHIELD | Admitting: Neurology

## 2015-06-12 ENCOUNTER — Encounter: Payer: Self-pay | Admitting: Neurology

## 2015-06-12 VITALS — BP 110/74 | HR 64 | Ht 67.5 in | Wt 235.1 lb

## 2015-06-12 DIAGNOSIS — R292 Abnormal reflex: Secondary | ICD-10-CM | POA: Diagnosis not present

## 2015-06-12 DIAGNOSIS — R202 Paresthesia of skin: Secondary | ICD-10-CM

## 2015-06-12 DIAGNOSIS — M5410 Radiculopathy, site unspecified: Secondary | ICD-10-CM | POA: Diagnosis not present

## 2015-06-12 DIAGNOSIS — R29818 Other symptoms and signs involving the nervous system: Secondary | ICD-10-CM

## 2015-06-12 DIAGNOSIS — M5418 Radiculopathy, sacral and sacrococcygeal region: Secondary | ICD-10-CM

## 2015-06-12 MED ORDER — NAPROXEN 500 MG PO TABS: 500.0000 mg | ORAL_TABLET | Freq: Two times a day (BID) | ORAL | Status: DC

## 2015-06-12 MED ORDER — CYCLOBENZAPRINE HCL 5 MG PO TABS: 5.0000 mg | ORAL_TABLET | Freq: Every evening | ORAL | Status: DC | PRN

## 2015-06-12 NOTE — Patient Instructions (Addendum)
1.  MRI lumbar spine without contrast 2.  You can use naproxen and flexeril as needed for your pain  Return to clinic if symptoms worsen

## 2015-06-12 NOTE — Progress Notes (Signed)
[facility] Clinic Note - Initial Visit   Date: 06/12/2015  Alexis Suarez MRN: [id number] DOB: March 15, 1975   Dear Webb Silversmith, NP:  Thank you for your kind referral of Alexis Suarez for consultation of left leg numbness. Although her history is well known to you, please allow [place] to reiterate it for the purpose of our medical record. The patient was accompanied to the clinic by self.    History of Present Illness: Alexis Suarez is a 40 y.o. right-handed African African female with no prior medical problems presenting for evaluation of left pain numbness.    Starting around early February 2017, she was walking around a track and developed left sided low back pain, radiating down her buttocks, posterior thigh, and into the knee.  Pain was sharp and severe.  She does not recall injuring her back.  She was given prednisone course x 2 which eventually resolved her pain within 3 weeks; however, since then, she has constant numbness over the same region.  She was referred to see me for evaluation, but about two weeks ago, her numbness resolved.  Now, she has episodic sharp pain over the same region which occurs randomly with exertion or even at rest about 3-4 times per week, lasting about 3-4 hours.  She has naproxen, robaxin, and valium which does not help.  She denies bowel/bladder changes, weakness, or falls.    Out-side paper records, electronic medical record, and images have been reviewed where available and summarized as:  XR lumbar spine 04/25/2015:  There is no acute bony abnormality of the lumbar spine nor significant disc space narrowing. Mild curvature convex toward the left centered at L4-5 may be related to muscle spasm.   Past Medical History  Diagnosis Date  . Asthma     ED eval in the past    Past Surgical History  Procedure Laterality Date  . Tonsilectomy/adenoidectomy with myringotomy    . Breast surgery  2000    breast reduction  .  Cesarean section  2012  . Abdominal hysterectomy  2015    partial  . Dilation and curettage of uterus       Medications:  Outpatient Encounter Prescriptions as of 06/12/2015  Medication Sig  . methocarbamol (ROBAXIN) 500 MG tablet Take 1 tablet (500 mg total) by mouth at bedtime. (Patient not taking: Reported on 04/25/2015)  . methylPREDNISolone (MEDROL DOSEPAK) 4 MG TBPK tablet Take as directed  . naproxen (NAPROSYN) 500 MG tablet Take 1 tablet (500 mg total) by mouth 2 (two) times daily with a meal.  . oxyCODONE-acetaminophen (ROXICET) 5-325 MG tablet Take 1 tablet by mouth every 4 (four) hours as needed for severe pain.  Marland Kitchen triamcinolone cream (KENALOG) 0.1 % Apply 1 application topically 2 (two) times daily.  . [DISCONTINUED] diazepam (VALIUM) 2 MG tablet Take 1 tablet (2 mg total) by mouth every 8 (eight) hours as needed for muscle spasms.  . [DISCONTINUED] predniSONE (DELTASONE) 10 MG tablet Take 3 tabs on days 1-3, take 2 tabs on days 4-6, take 1 tab on days 7-9   No facility-administered encounter medications on file as of 06/12/2015.     Allergies: No Known Allergies  Family History: Family History  Problem Relation Age of Onset  . Thyroid disease Mother   . Hypertension Sister   . Miscarriages / Stillbirths Sister   . Stroke Sister   . Prostate cancer Paternal Grandfather   . Cancer Paternal Grandfather     Prostate  .  Diabetes Father     Social History: Social History  Substance Use Topics  . Smoking status: Never Smoker   . Smokeless tobacco: Never Used  . Alcohol Use: 0.0 oz/week    0 Standard drinks or equivalent per week     Comment: Rare   Social History   Social History Narrative   Lives in [place] with mother, husband, and son, 5 months. Dog.      Work - Insurance underwriter, Product/process development scientist, counselor      Diet - regular diet      Exercise - Physiological scientist, Financial controller, at [employer], [employer]    Review of Systems:  CONSTITUTIONAL: No fevers, chills,  night sweats, or weight loss.   EYES: No visual changes or eye pain ENT: No hearing changes.  No history of nose bleeds.   RESPIRATORY: No cough, wheezing and shortness of breath.   CARDIOVASCULAR: Negative for chest pain, and palpitations.   GI: Negative for abdominal discomfort, blood in stools or black stools.  No recent change in bowel habits.   GU:  No history of incontinence.   MUSCLOSKELETAL: No history of joint pain or swelling.  No myalgias.   SKIN: Negative for lesions, rash, and itching.   HEMATOLOGY/ONCOLOGY: Negative for prolonged bleeding, bruising easily, and swollen nodes.  No history of cancer.   ENDOCRINE: Negative for cold or heat intolerance, polydipsia or goiter.   PSYCH:  No depression or anxiety symptoms.   NEURO: As Above.   Vital Signs:  BP 110/74 mmHg  Pulse 64  Ht 5' 7.5" (1.715 m)  Wt 235 lb 1 oz (106.624 kg)  BMI 36.25 kg/m2  LMP 04/09/2012 Pain Scale: 0 on a scale of 0-10   General Medical Exam:   General:  Well appearing, comfortable.   Eyes/ENT: see cranial nerve examination.   Neck: No masses appreciated.  Full range of motion without tenderness.  No carotid bruits. Respiratory:  Clear to auscultation, good air entry bilaterally.   Cardiac:  Regular rate and rhythm, no murmur.   Extremities:  No deformities, edema, or skin discoloration.  Skin:  No rashes or lesions.  Neurological Exam: MENTAL STATUS including orientation to time, place, person, recent and remote memory, attention span and concentration, language, and fund of knowledge is normal.  Speech is not dysarthric.  CRANIAL NERVES: II:  No visual field defects.  Unremarkable fundi.   III-IV-VI: Pupils equal round and reactive to light.  Normal conjugate, extra-ocular eye movements in all directions of gaze.  No nystagmus.  No ptosis.   V:  Normal facial sensation.     VII:  Normal facial symmetry and movements.  No pathologic facial reflexes.  VIII:  Normal hearing and vestibular  function.   IX-X:  Normal palatal movement.   XI:  Normal shoulder shrug and head rotation.   XII:  Normal tongue strength and range of motion, no deviation or fasciculation.  MOTOR:  No atrophy, fasciculations or abnormal movements.  No pronator drift.  Tone is normal.    Right Upper Extremity:    Left Upper Extremity:    Deltoid  5/5   Deltoid  5/5   Biceps  5/5   Biceps  5/5   Triceps  5/5   Triceps  5/5   Wrist extensors  5/5   Wrist extensors  5/5   Wrist flexors  5/5   Wrist flexors  5/5   Finger extensors  5/5   Finger extensors  5/5   Finger flexors  5/5   Finger flexors  5/5   Dorsal interossei  5/5   Dorsal interossei  5/5   Abductor pollicis  5/5   Abductor pollicis  5/5   Tone (Ashworth scale)  0  Tone (Ashworth scale)  0   Right Lower Extremity:    Left Lower Extremity:    Hip flexors  5/5   Hip flexors  5/5   Hip extensors  5/5   Hip extensors  5/5   Knee flexors  5/5   Knee flexors  5/5   Knee extensors  5/5   Knee extensors  5/5   Dorsiflexors  5/5   Dorsiflexors  5/5   Plantarflexors  5/5   Plantarflexors  5/5   Toe extensors  5/5   Toe extensors  5/5   Toe flexors  5/5   Toe flexors  5-/5   Tone (Ashworth scale)  0  Tone (Ashworth scale)  0   MSRs:  Right                                                                 Left brachioradialis 2+  brachioradialis 2+  biceps 2+  biceps 2+  triceps 2+  triceps 2+  patellar 2+  patellar 2+  ankle jerk 2+  ankle jerk 0  Hoffman no  Hoffman no  plantar response down  plantar response down   SENSORY:  Normal and symmetric perception of light touch, pinprick, vibration, and proprioception.  Romberg's sign absent.   COORDINATION/GAIT: Normal finger-to- nose-finger and heel-to-shin.  Intact rapid alternating movements bilaterally.  Able to rise from a chair without using arms.  Gait narrow based and stable. Tandem and stressed gait intact.    IMPRESSION: Ms. Garand is a 40 year-old female referred for evaluation of  left sided radicular pain and paresthesias.  Her exam shows absent Achilles reflex on the left with mild toe flexion weakness, suggestive of S1 radiculopathy.  However, I would expect her to have pain and paresthesias extending past the posterior knee, so it may be that she has S2 > S1 radiculopathy.  With her ongoing and intermittent radicular pain, MRI lumbar spine wo contrast will be ordered to look for structural disease.    For pain, she can continue naproxen 500mg  BID prn and start flexeril 5mg  qhs prn    The duration of this appointment visit was 40 minutes of face-to-face time with the patient.  Greater than 50% of this time was spent in counseling, explanation of diagnosis, planning of further management, and coordination of care.   Thank you for allowing me to participate in patient's care.  If I can answer any additional questions, I would be pleased to do so.    Sincerely,    Malena Timpone K. Posey Pronto, DO

## 2015-06-14 ENCOUNTER — Encounter: Payer: Self-pay | Admitting: Internal Medicine

## 2015-06-14 ENCOUNTER — Ambulatory Visit (INDEPENDENT_AMBULATORY_CARE_PROVIDER_SITE_OTHER): Payer: BLUE CROSS/BLUE SHIELD | Admitting: Internal Medicine

## 2015-06-14 VITALS — BP 118/82 | HR 66 | Temp 98.1°F | Ht 67.5 in | Wt 233.5 lb

## 2015-06-14 DIAGNOSIS — R635 Abnormal weight gain: Secondary | ICD-10-CM

## 2015-06-14 DIAGNOSIS — J301 Allergic rhinitis due to pollen: Secondary | ICD-10-CM | POA: Diagnosis not present

## 2015-06-14 DIAGNOSIS — Z0001 Encounter for general adult medical examination with abnormal findings: Secondary | ICD-10-CM

## 2015-06-14 DIAGNOSIS — Z0000 Encounter for general adult medical examination without abnormal findings: Secondary | ICD-10-CM

## 2015-06-14 LAB — COMPREHENSIVE METABOLIC PANEL
ALT: 14 U/L (ref 0–35)
AST: 15 U/L (ref 0–37)
Albumin: 3.9 g/dL (ref 3.5–5.2)
Alkaline Phosphatase: 92 U/L (ref 39–117)
BUN: 14 mg/dL (ref 6–23)
CHLORIDE: 104 meq/L (ref 96–112)
CO2: 27 meq/L (ref 19–32)
CREATININE: 0.73 mg/dL (ref 0.40–1.20)
Calcium: 9.1 mg/dL (ref 8.4–10.5)
GFR: 114.17 mL/min (ref >60.00)
Glucose, Bld: 79 mg/dL (ref 70–99)
Potassium: 4.1 mEq/L (ref 3.5–5.1)
SODIUM: 136 meq/L (ref 135–145)
Total Bilirubin: 0.6 mg/dL (ref 0.2–1.2)
Total Protein: 7.1 g/dL (ref 6.0–8.3)

## 2015-06-14 LAB — CBC
HCT: 36.9 % (ref 36.0–46.0)
Hemoglobin: 12.4 g/dL (ref 12.0–15.0)
MCHC: 33.6 g/dL (ref 30.0–36.0)
MCV: 81.3 fl (ref 78.0–100.0)
Platelets: 228 10*3/uL (ref 150.0–400.0)
RBC: 4.54 Mil/uL (ref 3.87–5.11)
RDW: 13.5 % (ref 11.5–15.5)
WBC: 6.3 10*3/uL (ref 4.0–10.5)

## 2015-06-14 LAB — HEMOGLOBIN A1C: HEMOGLOBIN A1C: 5.5 % (ref 4.6–6.5)

## 2015-06-14 LAB — LIPID PANEL
CHOL/HDL RATIO: 4
Cholesterol: 158 mg/dL (ref 0–200)
HDL: 43.1 mg/dL (ref >39.00)
LDL CALC: 101 mg/dL — AB (ref 0–99)
NONHDL: 114.49
Triglycerides: 66 mg/dL (ref 0.0–149.0)
VLDL: 13.2 mg/dL (ref 0.0–40.0)

## 2015-06-14 MED ORDER — PHENTERMINE HCL 15 MG PO CAPS: 15.0000 mg | ORAL_CAPSULE | ORAL | Status: DC

## 2015-06-14 NOTE — Patient Instructions (Signed)
Health Maintenance, Female Adopting a healthy lifestyle and getting preventive care can go a long way to promote health and wellness. Talk with your health care provider about what schedule of regular examinations is right for you. This is a good chance for you to check in with your provider about disease prevention and staying healthy. In between checkups, there are plenty of things you can do on your own. Experts have done a lot of research about which lifestyle changes and preventive measures are most likely to keep you healthy. Ask your health care provider for more information. WEIGHT AND DIET  Eat a healthy diet  Be sure to include plenty of vegetables, fruits, low-fat dairy products, and lean protein.  Do not eat a lot of foods high in solid fats, added sugars, or salt.  Get regular exercise. This is one of the most important things you can do for your health.  Most adults should exercise for at least 150 minutes each week. The exercise should increase your heart rate and make you sweat (moderate-intensity exercise).  Most adults should also do strengthening exercises at least twice a week. This is in addition to the moderate-intensity exercise.  Maintain a healthy weight  Body mass index (BMI) is a measurement that can be used to identify possible weight problems. It estimates body fat based on height and weight. Your health care provider can help determine your BMI and help you achieve or maintain a healthy weight.  For females 20 years of age and older:   A BMI below 18.5 is considered underweight.  A BMI of 18.5 to 24.9 is normal.  A BMI of 25 to 29.9 is considered overweight.  A BMI of 30 and above is considered obese.  Watch levels of cholesterol and blood lipids  You should start having your blood tested for lipids and cholesterol at 39 years of age, then have this test every 5 years.  You may need to have your cholesterol levels checked more often if:  Your lipid  or cholesterol levels are high.  You are older than 39 years of age.  You are at high risk for heart disease.  CANCER SCREENING   Lung Cancer  Lung cancer screening is recommended for adults 55-80 years old who are at high risk for lung cancer because of a history of smoking.  A yearly low-dose CT scan of the lungs is recommended for people who:  Currently smoke.  Have quit within the past 15 years.  Have at least a 30-pack-year history of smoking. A pack year is smoking an average of one pack of cigarettes a day for 1 year.  Yearly screening should continue until it has been 15 years since you quit.  Yearly screening should stop if you develop a health problem that would prevent you from having lung cancer treatment.  Breast Cancer  Practice breast self-awareness. This means understanding how your breasts normally appear and feel.  It also means doing regular breast self-exams. Let your health care provider know about any changes, no matter how small.  If you are in your 20s or 30s, you should have a clinical breast exam (CBE) by a health care provider every 1-3 years as part of a regular health exam.  If you are 40 or older, have a CBE every year. Also consider having a breast X-ray (mammogram) every year.  If you have a family history of breast cancer, talk to your health care provider about genetic screening.  If you   are at high risk for breast cancer, talk to your health care provider about having an MRI and a mammogram every year.  Breast cancer gene (BRCA) assessment is recommended for women who have family members with BRCA-related cancers. BRCA-related cancers include:  Breast.  Ovarian.  Tubal.  Peritoneal cancers.  Results of the assessment will determine the need for genetic counseling and BRCA1 and BRCA2 testing. Cervical Cancer Your health care provider may recommend that you be screened regularly for cancer of the pelvic organs (ovaries, uterus, and  vagina). This screening involves a pelvic examination, including checking for microscopic changes to the surface of your cervix (Pap test). You may be encouraged to have this screening done every 3 years, beginning at age 21.  For women ages 30-65, health care providers may recommend pelvic exams and Pap testing every 3 years, or they may recommend the Pap and pelvic exam, combined with testing for human papilloma virus (HPV), every 5 years. Some types of HPV increase your risk of cervical cancer. Testing for HPV may also be done on women of any age with unclear Pap test results.  Other health care providers may not recommend any screening for nonpregnant women who are considered low risk for pelvic cancer and who do not have symptoms. Ask your health care provider if a screening pelvic exam is right for you.  If you have had past treatment for cervical cancer or a condition that could lead to cancer, you need Pap tests and screening for cancer for at least 20 years after your treatment. If Pap tests have been discontinued, your risk factors (such as having a new sexual partner) need to be reassessed to determine if screening should resume. Some women have medical problems that increase the chance of getting cervical cancer. In these cases, your health care provider may recommend more frequent screening and Pap tests. Colorectal Cancer  This type of cancer can be detected and often prevented.  Routine colorectal cancer screening usually begins at 39 years of age and continues through 39 years of age.  Your health care provider may recommend screening at an earlier age if you have risk factors for colon cancer.  Your health care provider may also recommend using home test kits to check for hidden blood in the stool.  A small camera at the end of a tube can be used to examine your colon directly (sigmoidoscopy or colonoscopy). This is done to check for the earliest forms of colorectal  cancer.  Routine screening usually begins at age 50.  Direct examination of the colon should be repeated every 5-10 years through 39 years of age. However, you may need to be screened more often if early forms of precancerous polyps or small growths are found. Skin Cancer  Check your skin from head to toe regularly.  Tell your health care provider about any new moles or changes in moles, especially if there is a change in a mole's shape or color.  Also tell your health care provider if you have a mole that is larger than the size of a pencil eraser.  Always use sunscreen. Apply sunscreen liberally and repeatedly throughout the day.  Protect yourself by wearing long sleeves, pants, a wide-brimmed hat, and sunglasses whenever you are outside. HEART DISEASE, DIABETES, AND HIGH BLOOD PRESSURE   High blood pressure causes heart disease and increases the risk of stroke. High blood pressure is more likely to develop in:  People who have blood pressure in the high end   of the normal range (130-139/85-89 mm Hg).  People who are overweight or obese.  People who are African American.  If you are 38-23 years of age, have your blood pressure checked every 3-5 years. If you are 61 years of age or older, have your blood pressure checked every year. You should have your blood pressure measured twice--once when you are at a hospital or clinic, and once when you are not at a hospital or clinic. Record the average of the two measurements. To check your blood pressure when you are not at a hospital or clinic, you can use:  An automated blood pressure machine at a pharmacy.  A home blood pressure monitor.  If you are between 45 years and 39 years old, ask your health care provider if you should take aspirin to prevent strokes.  Have regular diabetes screenings. This involves taking a blood sample to check your fasting blood sugar level.  If you are at a normal weight and have a low risk for diabetes,  have this test once every three years after 39 years of age.  If you are overweight and have a high risk for diabetes, consider being tested at a younger age or more often. PREVENTING INFECTION  Hepatitis B  If you have a higher risk for hepatitis B, you should be screened for this virus. You are considered at high risk for hepatitis B if:  You were born in a country where hepatitis B is common. Ask your health care provider which countries are considered high risk.  Your parents were born in a high-risk country, and you have not been immunized against hepatitis B (hepatitis B vaccine).  You have HIV or AIDS.  You use needles to inject street drugs.  You live with someone who has hepatitis B.  You have had sex with someone who has hepatitis B.  You get hemodialysis treatment.  You take certain medicines for conditions, including cancer, organ transplantation, and autoimmune conditions. Hepatitis C  Blood testing is recommended for:  Everyone born from [birth year] through 1964.  Anyone with known risk factors for hepatitis C. Sexually transmitted infections (STIs)  You should be screened for sexually transmitted infections (STIs) including gonorrhea and chlamydia if:  You are sexually active and are younger than 39 years of age.  You are older than 39 years of age and your health care provider tells you that you are at risk for this type of infection.  Your sexual activity has changed since you were last screened and you are at an increased risk for chlamydia or gonorrhea. Ask your health care provider if you are at risk.  If you do not have HIV, but are at risk, it may be recommended that you take a prescription medicine daily to prevent HIV infection. This is called pre-exposure prophylaxis (PrEP). You are considered at risk if:  You are sexually active and do not regularly use condoms or know the HIV status of your partner(s).  You take drugs by injection.  You are sexually  active with a partner who has HIV. Talk with your health care provider about whether you are at high risk of being infected with HIV. If you choose to begin PrEP, you should first be tested for HIV. You should then be tested every 3 months for as long as you are taking PrEP.  PREGNANCY   If you are premenopausal and you may become pregnant, ask your health care provider about preconception counseling.  If you may  become pregnant, take 400 to 800 micrograms (mcg) of folic acid every day.  If you want to prevent pregnancy, talk to your health care provider about birth control (contraception). OSTEOPOROSIS AND MENOPAUSE   Osteoporosis is a disease in which the bones lose minerals and strength with aging. This can result in serious bone fractures. Your risk for osteoporosis can be identified using a bone density scan.  If you are 61 years of age or older, or if you are at risk for osteoporosis and fractures, ask your health care provider if you should be screened.  Ask your health care provider whether you should take a calcium or vitamin D supplement to lower your risk for osteoporosis.  Menopause may have certain physical symptoms and risks.  Hormone replacement therapy may reduce some of these symptoms and risks. Talk to your health care provider about whether hormone replacement therapy is right for you.  HOME CARE INSTRUCTIONS   Schedule regular health, dental, and eye exams.  Stay current with your immunizations.   Do not use any tobacco products including cigarettes, chewing tobacco, or electronic cigarettes.  If you are pregnant, do not drink alcohol.  If you are breastfeeding, limit how much and how often you drink alcohol.  Limit alcohol intake to no more than 1 drink per day for nonpregnant women. One drink equals 12 ounces of beer, 5 ounces of wine, or 1 ounces of hard liquor.  Do not use street drugs.  Do not share needles.  Ask your health care provider for help if  you need support or information about quitting drugs.  Tell your health care provider if you often feel depressed.  Tell your health care provider if you have ever been abused or do not feel safe at home.   This information is not intended to replace advice given to you by your health care provider. Make sure you discuss any questions you have with your health care provider.   Document Released: 09/03/2010 Document Revised: 03/11/2014 Document Reviewed: 01/20/2013 Elsevier Interactive Patient Education [employer].

## 2015-06-14 NOTE — Progress Notes (Signed)
Subjective:    Patient ID: Alexis Suarez, female    DOB: Jul 05, 1975, 40 y.o.   MRN: [id number]  HPI  Pt presents to the clinic today for her annual exam.  Flu: never Tetanus: 2012 Pap Smear: She thinks it was in 2015, in EMR it says 2015, partial hysterectomy Dentist: biannually  Diet: She does eat meat. She consumes fruits and veggies daily. She tries to avoid fried foods. She drinks water. Exercise: She is walking 2.2 miles 2 x week. She starts bootcamp 3 x week, next week.  She is concerned about headache, runny nose, nasal congestion and sore throat. This started 2 days ago. She is blowing clear mucous out of her nose. She denies difficulty swallowing. She denies fever, chills or body aches. She has tried Zyrtec without any relief. She has not had sick contacts. She did not get her flu shot.  She is also concerned about abnormal weight gain. Her BMI is 36. She would like to start Phentermine.  Review of Systems   Past Medical History  Diagnosis Date  . Asthma     ED eval in the past  . Sciatica     Current Outpatient Prescriptions  Medication Sig Dispense Refill  . cyclobenzaprine (FLEXERIL) 5 MG tablet Take 1 tablet (5 mg total) by mouth at bedtime as needed for muscle spasms. 20 tablet 1  . naproxen (NAPROSYN) 500 MG tablet Take 1 tablet (500 mg total) by mouth 2 (two) times daily with a meal. 30 tablet 0   No current facility-administered medications for this visit.    No Known Allergies  Family History  Problem Relation Age of Onset  . Thyroid disease Mother   . Hypertension Sister   . Miscarriages / Stillbirths Sister   . Stroke Sister   . Prostate cancer Paternal Grandfather   . Cancer Paternal Grandfather     Prostate  . Diabetes Father     Social History   Social History  . Marital Status: Single    Spouse Name: N/A  . Number of Children: N/A  . Years of Education: N/A   Occupational History  . Not on file.   Social History Main Topics    . Smoking status: Never Smoker   . Smokeless tobacco: Never Used  . Alcohol Use: 0.0 oz/week    0 Standard drinks or equivalent per week     Comment: Rare  . Drug Use: No  . Sexual Activity: Not on file   Other Topics Concern  . Not on file   Social History Narrative   Lives in [place] with mother, son and boyfriend in a one story home. 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      Education: college degree and 1 year of grad school.      Diet - regular diet                 Constitutional: Pt reports headache and weight gain. Denies fever, malaise, fatigue.  HEENT: Pt reports runny nose, nasal congestion and sore throat. Denies eye pain, eye redness, ear pain, ringing in the ears, wax buildup. Respiratory: Denies difficulty breathing, shortness of breath, cough or sputum production.   Cardiovascular: Denies chest pain, chest tightness, palpitations or swelling in the hands or feet.  Gastrointestinal: Denies abdominal pain, bloating, constipation, diarrhea or blood in the stool.  GU: Denies urgency, frequency, pain with urination, burning sensation, blood in urine, odor or discharge. Musculoskeletal: Denies decrease in range of motion, difficulty  with gait, muscle pain or joint pain and swelling.  Skin: Denies redness, rashes, lesions or ulcercations.  Neurological: Denies dizziness, difficulty with memory, difficulty with speech or problems with balance and coordination.  Psych: Denies anxiety, depression, SI/HI.  No other specific complaints in a complete review of systems (except as listed in HPI above).     Objective:   Physical Exam   BP 118/82 mmHg  Pulse 66  Temp(Src) 98.1 F (36.7 C) (Oral)  Ht 5' 7.5" (1.715 m)  Wt 233 lb 8 oz (105.915 kg)  BMI 36.01 kg/m2  SpO2 100%  LMP 04/09/2012 Wt Readings from Last 3 Encounters:  06/14/15 233 lb 8 oz (105.915 kg)  06/12/15 235 lb 1 oz (106.624 kg)  04/25/15 230 lb (104.327 kg)    General: Appears her stated age, obese in  NAD. Skin: Warm, dry and intact. No rashes, lesions or ulcerations noted. HEENT: Head: normal shape and size, no sinus tenderness noted; Eyes: sclera white, no icterus, conjunctiva pink, PERRLA and EOMs intact; Ears: Tm's gray and intact, normal light reflex; Nose: mucosa boggy and moist, septum midline; Throat/Mouth: Teeth present, mucosa pink and moist, no exudate, lesions or ulcerations noted.  Neck:  Neck supple, trachea midline. No masses, lumps or thyromegaly present.  Cardiovascular: Normal rate and rhythm. S1,S2 noted.  No murmur, rubs or gallops noted. No JVD or BLE edema.  Pulmonary/Chest: Normal effort and positive vesicular breath sounds. No respiratory distress. No wheezes, rales or ronchi noted.  Abdomen: Distended but soft and nontender. Normal bowel sounds. No distention or masses noted. Liver, spleen and kidneys non palpable. Musculoskeletal: Strength 5/5 BUE/BLE. No signs of joint swelling. No difficulty with gait.  Neurological: Alert and oriented. Cranial nerves II-XII grossly intact. Coordination normal.  Psychiatric: Mood and affect normal. Behavior is normal. Judgment and thought content normal.     BMET    Component Value Date/Time   NA 139 02/10/2014 0516   K 3.8 02/10/2014 0516   CL 103 02/10/2014 0516   CO2 30 02/10/2014 0516   GLUCOSE 76 02/10/2014 0516   BUN 5* 02/10/2014 0516   CREATININE 0.75 02/10/2014 0516   CALCIUM 7.9* 02/10/2014 0516   GFRNONAA >60 02/10/2014 0516   GFRNONAA >60 10/11/2012 1547   GFRAA >60 02/10/2014 0516   GFRAA >60 10/11/2012 1547    Lipid Panel  No results found for: CHOL, TRIG, HDL, CHOLHDL, VLDL, LDLCALC  CBC    Component Value Date/Time   WBC 15.4* 02/10/2014 0516   RBC 2.87* 02/10/2014 0516   HGB 8.4* 02/10/2014 1750   HCT 25.0* 02/10/2014 0516   PLT 125* 02/10/2014 0516   MCV 87 02/10/2014 0516   MCH 28.4 02/10/2014 0516   MCHC 32.7 02/10/2014 0516   RDW 14.4 02/10/2014 0516   LYMPHSABS 1.6 02/10/2014 0516    MONOABS 0.9 02/10/2014 0516   EOSABS 0.2 02/10/2014 0516   BASOSABS 0.0 02/10/2014 0516    Hgb A1C No results found for: HGBA1C      Assessment & Plan:   Preventative Health Maintenance:  She declines flu shot Tetanus UTD Pelvic due 2018, will request copy of last pap from [facility] at 67 Encouraged her to continue biannual dental exam Encouraged her to consume a balanced diet and start a routine exercise program Will check CBC, CMET, Lipid and A1C  Abnormal weight gain:  RX for Phentermine prescribed Continue diet and exercise  Allergic Rhinitis:  Start Zyrtec and Flonase OTC  RTC in  1 month for weight check/med refill

## 2015-06-14 NOTE — Progress Notes (Signed)
Pre visit review using our clinic review tool, if applicable. No additional management support is needed unless otherwise documented below in the visit note. 

## 2015-06-27 ENCOUNTER — Encounter: Payer: Self-pay | Admitting: Internal Medicine

## 2015-07-11 ENCOUNTER — Other Ambulatory Visit: Payer: BLUE CROSS/BLUE SHIELD

## 2015-07-14 ENCOUNTER — Ambulatory Visit: Payer: BLUE CROSS/BLUE SHIELD | Admitting: Internal Medicine

## 2015-07-17 ENCOUNTER — Encounter: Payer: Self-pay | Admitting: Internal Medicine

## 2015-07-17 ENCOUNTER — Ambulatory Visit (INDEPENDENT_AMBULATORY_CARE_PROVIDER_SITE_OTHER): Payer: BLUE CROSS/BLUE SHIELD | Admitting: Internal Medicine

## 2015-07-17 VITALS — BP 120/80 | HR 78 | Temp 98.2°F | Wt 226.5 lb

## 2015-07-17 DIAGNOSIS — R635 Abnormal weight gain: Secondary | ICD-10-CM | POA: Diagnosis not present

## 2015-07-17 MED ORDER — PHENTERMINE HCL 15 MG PO CAPS: 15.0000 mg | ORAL_CAPSULE | ORAL | Status: DC

## 2015-07-17 NOTE — Progress Notes (Signed)
Pre visit review using our clinic review tool, if applicable. No additional management support is needed unless otherwise documented below in the visit note. 

## 2015-07-17 NOTE — Patient Instructions (Signed)

## 2015-07-17 NOTE — Progress Notes (Signed)
Subjective:    Patient ID: Alexis Suarez, female    DOB: 01/11/1976, 40 y.o.   MRN: [id number]  HPI  Pt presents to the clinic today for 1 month follow up for weight check and med refill. She was started on Phentermine 06/2015. Her starting weight was 235.06 lbs, BMI 36.25. She has been taking the medication as directed and denies adverse effects. Her weight today is 226.5 with a BMI of 34.63.  She reports she has stopped drinking soda and started drinking at least 4 cups of water daily. She is exercising 3 days per week.  Review of Systems      Past Medical History  Diagnosis Date  . Asthma     ED eval in the past  . Sciatica     Current Outpatient Prescriptions  Medication Sig Dispense Refill  . cyclobenzaprine (FLEXERIL) 5 MG tablet Take 1 tablet (5 mg total) by mouth at bedtime as needed for muscle spasms. 20 tablet 1  . naproxen (NAPROSYN) 500 MG tablet Take 1 tablet (500 mg total) by mouth 2 (two) times daily with a meal. 30 tablet 0  . phentermine 15 MG capsule Take 1 capsule (15 mg total) by mouth every morning. 30 capsule 0   No current facility-administered medications for this visit.    No Known Allergies  Family History  Problem Relation Age of Onset  . Thyroid disease Mother   . Hypertension Sister   . Miscarriages / Stillbirths Sister   . Stroke Sister   . Prostate cancer Paternal Grandfather   . Cancer Paternal Grandfather     Prostate  . Diabetes Father     Social History   Social History  . Marital Status: Single    Spouse Name: N/A  . Number of Children: N/A  . Years of Education: N/A   Occupational History  . Not on file.   Social History Main Topics  . Smoking status: Never Smoker   . Smokeless tobacco: Never Used  . Alcohol Use: 0.0 oz/week    0 Standard drinks or equivalent per week     Comment: Rare  . Drug Use: No  . Sexual Activity: Not on file   Other Topics Concern  . Not on file   Social History Narrative   Lives in  [place] with mother, son and boyfriend in a one story home. 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      Education: college degree and 1 year of grad school.      Diet - regular diet                 Constitutional: Denies fever, malaise, fatigue, headache or abrupt weight changes.  Respiratory: Denies difficulty breathing, shortness of breath, cough or sputum production.   Cardiovascular: Denies chest pain, chest tightness, palpitations or swelling in the hands or feet.  Neurological: Denies dizziness, difficulty with memory, difficulty with speech or problems with balance and coordination.    No other specific complaints in a complete review of systems (except as listed in HPI above).  Objective:   Physical Exam  BP 120/80 mmHg  Pulse 78  Temp(Src) 98.2 F (36.8 C) (Oral)  Wt 226 lb 8 oz (102.74 kg)  SpO2 99%  LMP 04/09/2012 Wt Readings from Last 3 Encounters:  07/17/15 226 lb 8 oz (102.74 kg)  06/14/15 233 lb 8 oz (105.915 kg)  06/12/15 235 lb 1 oz (106.624 kg)    General: Appears her stated age, obese in  NAD. Cardiovascular: Normal rate and rhythm. S1,S2 noted.  No murmur, rubs or gallops noted.  Pulmonary/Chest: Normal effort and positive vesicular breath sounds. No respiratory distress. No wheezes, rales or ronchi noted.  Neurological: Alert and oriented.    BMET    Component Value Date/Time   NA 136 06/14/2015 1515   NA 139 02/10/2014 0516   K 4.1 06/14/2015 1515   K 3.8 02/10/2014 0516   CL 104 06/14/2015 1515   CL 103 02/10/2014 0516   CO2 27 06/14/2015 1515   CO2 30 02/10/2014 0516   GLUCOSE 79 06/14/2015 1515   GLUCOSE 76 02/10/2014 0516   BUN 14 06/14/2015 1515   BUN 5* 02/10/2014 0516   CREATININE 0.73 06/14/2015 1515   CREATININE 0.75 02/10/2014 0516   CALCIUM 9.1 06/14/2015 1515   CALCIUM 7.9* 02/10/2014 0516   GFRNONAA >60 02/10/2014 0516   GFRNONAA >60 10/11/2012 1547   GFRAA >60 02/10/2014 0516   GFRAA >60 10/11/2012 1547    Lipid Panel       Component Value Date/Time   CHOL 158 06/14/2015 1515   TRIG 66.0 06/14/2015 1515   HDL 43.10 06/14/2015 1515   CHOLHDL 4 06/14/2015 1515   VLDL 13.2 06/14/2015 1515   LDLCALC 101* 06/14/2015 1515    CBC    Component Value Date/Time   WBC 6.3 06/14/2015 1515   WBC 15.4* 02/10/2014 0516   RBC 4.54 06/14/2015 1515   RBC 2.87* 02/10/2014 0516   HGB 12.4 06/14/2015 1515   HGB 8.4* 02/10/2014 1750   HCT 36.9 06/14/2015 1515   HCT 25.0* 02/10/2014 0516   PLT 228.0 06/14/2015 1515   PLT 125* 02/10/2014 0516   MCV 81.3 06/14/2015 1515   MCV 87 02/10/2014 0516   MCH 28.4 02/10/2014 0516   MCHC 33.6 06/14/2015 1515   MCHC 32.7 02/10/2014 0516   RDW 13.5 06/14/2015 1515   RDW 14.4 02/10/2014 0516   LYMPHSABS 1.6 02/10/2014 0516   MONOABS 0.9 02/10/2014 0516   EOSABS 0.2 02/10/2014 0516   BASOSABS 0.0 02/10/2014 0516    Hgb A1C Lab Results  Component Value Date   HGBA1C 5.5 06/14/2015         Assessment & Plan:   Abnormal weight gain:  She seems to be doing well with diet and exercise Will continue Phentermine 15 mg daily  RTC in 1 month for weight check/med refill

## 2015-07-18 ENCOUNTER — Encounter: Payer: Self-pay | Admitting: Internal Medicine

## 2015-11-06 ENCOUNTER — Encounter: Payer: Self-pay | Admitting: Internal Medicine

## 2016-02-01 ENCOUNTER — Other Ambulatory Visit: Payer: Self-pay | Admitting: Internal Medicine

## 2016-02-02 ENCOUNTER — Ambulatory Visit: Payer: Medicaid Other | Admitting: Family Medicine

## 2016-03-18 ENCOUNTER — Ambulatory Visit (INDEPENDENT_AMBULATORY_CARE_PROVIDER_SITE_OTHER): Payer: BLUE CROSS/BLUE SHIELD | Admitting: Internal Medicine

## 2016-03-18 ENCOUNTER — Encounter: Payer: Self-pay | Admitting: Internal Medicine

## 2016-03-18 VITALS — BP 122/76 | HR 62 | Temp 98.1°F | Wt 233.0 lb

## 2016-03-18 DIAGNOSIS — B9789 Other viral agents as the cause of diseases classified elsewhere: Secondary | ICD-10-CM | POA: Diagnosis not present

## 2016-03-18 DIAGNOSIS — M79605 Pain in left leg: Secondary | ICD-10-CM

## 2016-03-18 DIAGNOSIS — J069 Acute upper respiratory infection, unspecified: Secondary | ICD-10-CM

## 2016-03-18 DIAGNOSIS — Z23 Encounter for immunization: Secondary | ICD-10-CM | POA: Diagnosis not present

## 2016-03-18 MED ORDER — CYCLOBENZAPRINE HCL 5 MG PO TABS
5.0000 mg | ORAL_TABLET | Freq: Every evening | ORAL | 1 refills | Status: DC | PRN
Start: 2016-03-18 — End: 2017-08-05

## 2016-03-18 NOTE — Progress Notes (Signed)
Subjective:    Patient ID: Alexis Suarez, female    DOB: 13-Jan-1976, 41 y.o.   MRN: [id number]  HPI  Pt presents to the clinic today to follow up low back pain with left side sciatica. She was seen 04/25/15 for the same. Xray of the lumbar spine was normal. She was advised to continue Naproxen. (she had been given Valium and Percocet in the ER for the same, but I did not refill these medications). She refused PT at that time, so I gave her exercises to do at home. She was referred to neurosurgery. She saw Dr. Posey Pronto 06/12/2015- note reviewed. An MRI of the lumbar spine was ordered but not completed. She was prescribed Flexeril in addition to her Naproxen. She reports she has not had any pain in a long time, but the pain returned 1 month ago. She has no pain in the back, just pain in the leg. She describes the pain as tight and pulling. It seems worse if she reaches up to do something. She denies numbness and tingling, loss of bowel or bladder. She denies any recent injury to her back. She has tried Naproxen with some relief but she is out of the Flexeril.   She also c/o sneezing, runny nose, sore throat and cough. This started yesterday. She is blowing clear mucous out of her nose. She denies difficultly swallowing. The cough is non productive. She denies fever, chills or body aches. She has taken some Mucinex with some relief. She has had sick contacts. She has history of asthma as a child but it has not affected her as an adult. She has not had a flu shot.  Review of Systems      Past Medical History:  Diagnosis Date  . Asthma    ED eval in the past  . Sciatica     Current Outpatient Prescriptions  Medication Sig Dispense Refill  . cyclobenzaprine (FLEXERIL) 5 MG tablet Take 1 tablet (5 mg total) by mouth at bedtime as needed for muscle spasms. 20 tablet 1  . naproxen (NAPROSYN) 500 MG tablet Take 1 tablet (500 mg total) by mouth 2 (two) times daily with a meal. 30 tablet 0   No current  facility-administered medications for this visit.     No Known Allergies  Family History  Problem Relation Age of Onset  . Thyroid disease Mother   . Hypertension Sister   . Miscarriages / Stillbirths Sister   . Stroke Sister   . Prostate cancer Paternal Grandfather   . Cancer Paternal Grandfather     Prostate  . Diabetes Father     Social History   Social History  . Marital status: Single    Spouse name: N/A  . Number of children: N/A  . Years of education: N/A   Occupational History  . Not on file.   Social History Main Topics  . Smoking status: Never Smoker  . Smokeless tobacco: Never Used  . Alcohol use 0.0 oz/week     Comment: Rare  . Drug use: No  . Sexual activity: Not on file   Other Topics Concern  . Not on file   Social History Narrative   Lives in [place] with mother, son and boyfriend in a one story home. 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      Education: college degree and 1 year of grad school.      Diet - regular diet  Constitutional: Denies fever, malaise, fatigue, headache or abrupt weight changes.  HEENT: Pt reports runny nose, sore throat. Denies eye pain, eye redness, ear pain, ringing in the ears, wax buildup, nasal congestion, bloody nose. Respiratory: Pt reports cough. Denies difficulty breathing, shortness of breath, or sputum production.   Musculoskeletal: Pt reports left leg pain. Denies decrease in range of motion, difficulty with gait, or joint pain and swelling.  Neurological: Denies problems with balance and coordination.    No other specific complaints in a complete review of systems (except as listed in HPI above).  Objective:   Physical Exam   , BP 122/76   Pulse 62   Temp 98.1 F (36.7 C) (Oral)   Wt 233 lb (105.7 kg)   LMP 04/09/2012   BMI 35.95 kg/m  Wt Readings from Last 3 Encounters:  03/18/16 233 lb (105.7 kg)  07/17/15 226 lb 8 oz (102.7 kg)  06/14/15 233 lb 8 oz (105.9 kg)    General:  Appears her stated age, obese in NAD. HEENT: Head: normal shape and size; Ears: Tm's gray and intact, normal light reflex; Nose: mucosa boggy and moist, septum midline; Throat/Mouth: Teeth present, mucosa pink and moist, no exudate, lesions or ulcerations noted.  Neck:  No adenopathy noted. Pulmonary/Chest: Normal effort and positive vesicular breath sounds. No respiratory distress. No wheezes, rales or ronchi noted.  Musculoskeletal: Normal flexion, extension and rotation of the lumbar spine. No bony tenderness noted over the lumbar spine. Strength 4/5 LLE, 5/5 RLE. No difficulty with gait.  Neurological: Sensation intact to BLE.  BMET    Component Value Date/Time   NA 136 06/14/2015 1515   NA 139 02/10/2014 0516   K 4.1 06/14/2015 1515   K 3.8 02/10/2014 0516   CL 104 06/14/2015 1515   CL 103 02/10/2014 0516   CO2 27 06/14/2015 1515   CO2 30 02/10/2014 0516   GLUCOSE 79 06/14/2015 1515   GLUCOSE 76 02/10/2014 0516   BUN 14 06/14/2015 1515   BUN 5 (L) 02/10/2014 0516   CREATININE 0.73 06/14/2015 1515   CREATININE 0.75 02/10/2014 0516   CALCIUM 9.1 06/14/2015 1515   CALCIUM 7.9 (L) 02/10/2014 0516   GFRNONAA >60 02/10/2014 0516   GFRNONAA >60 10/11/2012 1547   GFRAA >60 02/10/2014 0516   GFRAA >60 10/11/2012 1547    Lipid Panel     Component Value Date/Time   CHOL 158 06/14/2015 1515   TRIG 66.0 06/14/2015 1515   HDL 43.10 06/14/2015 1515   CHOLHDL 4 06/14/2015 1515   VLDL 13.2 06/14/2015 1515   LDLCALC 101 (H) 06/14/2015 1515    CBC    Component Value Date/Time   WBC 6.3 06/14/2015 1515   RBC 4.54 06/14/2015 1515   HGB 12.4 06/14/2015 1515   HGB 8.4 (L) 02/10/2014 1750   HCT 36.9 06/14/2015 1515   HCT 25.0 (L) 02/10/2014 0516   PLT 228.0 06/14/2015 1515   PLT 125 (L) 02/10/2014 0516   MCV 81.3 06/14/2015 1515   MCV 87 02/10/2014 0516   MCH 28.4 02/10/2014 0516   MCHC 33.6 06/14/2015 1515   RDW 13.5 06/14/2015 1515   RDW 14.4 02/10/2014 0516   LYMPHSABS 1.6  02/10/2014 0516   MONOABS 0.9 02/10/2014 0516   EOSABS 0.2 02/10/2014 0516   BASOSABS 0.0 02/10/2014 0516    Hgb A1C Lab Results  Component Value Date   HGBA1C 5.5 06/14/2015           Assessment & Plan:  Viral URI with Cough:  Advised her to start Flonase and Zyrtec OTC Delsym as needed for cough Flu shot given today  Left leg pain:  Seems more muscle strain vs sciatica Continue Naproxen Flexeril refilled  Stretching exercises given  If persist or worsen, she will follow up with Dr. Posey Pronto to have MRI done  RTC as needed or if symptoms persist or worsen Alexis Pischke, NP

## 2016-03-18 NOTE — Addendum Note (Signed)
Addended by: Royann Shivers A on: 03/18/2016 11:58 AM   Modules accepted: Orders

## 2016-03-18 NOTE — Patient Instructions (Signed)
Back Exercises Introduction If you have pain in your back, do these exercises 2-3 times each day or as told by your doctor. When the pain goes away, do the exercises once each day, but repeat the steps more times for each exercise (do more repetitions). If you do not have pain in your back, do these exercises once each day or as told by your doctor. Exercises Single Knee to Chest  Do these steps 3-5 times in a row for each leg: 1. Lie on your back on a firm bed or the floor with your legs stretched out. 2. Bring one knee to your chest. 3. Hold your knee to your chest by grabbing your knee or thigh. 4. Pull on your knee until you feel a gentle stretch in your lower back. 5. Keep doing the stretch for 10-30 seconds. 6. Slowly let go of your leg and straighten it. Pelvic Tilt  Do these steps 5-10 times in a row: 1. Lie on your back on a firm bed or the floor with your legs stretched out. 2. Bend your knees so they point up to the ceiling. Your feet should be flat on the floor. 3. Tighten your lower belly (abdomen) muscles to press your lower back against the floor. This will make your tailbone point up to the ceiling instead of pointing down to your feet or the floor. 4. Stay in this position for 5-10 seconds while you gently tighten your muscles and breathe evenly. Cat-Cow  Do these steps until your lower back bends more easily: 1. Get on your hands and knees on a firm surface. Keep your hands under your shoulders, and keep your knees under your hips. You may put padding under your knees. 2. Let your head hang down, and make your tailbone point down to the floor so your lower back is round like the back of a cat. 3. Stay in this position for 5 seconds. 4. Slowly lift your head and make your tailbone point up to the ceiling so your back hangs low (sags) like the back of a cow. 5. Stay in this position for 5 seconds. Press-Ups  Do these steps 5-10 times in a row: 1. Lie on your belly  (face-down) on the floor. 2. Place your hands near your head, about shoulder-width apart. 3. While you keep your back relaxed and keep your hips on the floor, slowly straighten your arms to raise the top half of your body and lift your shoulders. Do not use your back muscles. To make yourself more comfortable, you may change where you place your hands. 4. Stay in this position for 5 seconds. 5. Slowly return to lying flat on the floor. Bridges  Do these steps 10 times in a row: 1. Lie on your back on a firm surface. 2. Bend your knees so they point up to the ceiling. Your feet should be flat on the floor. 3. Tighten your butt muscles and lift your butt off of the floor until your waist is almost as high as your knees. If you do not feel the muscles working in your butt and the back of your thighs, slide your feet 1-2 inches farther away from your butt. 4. Stay in this position for 3-5 seconds. 5. Slowly lower your butt to the floor, and let your butt muscles relax. If this exercise is too easy, try doing it with your arms crossed over your chest. Belly Crunches  Do these steps 5-10 times in a row: 1. Lie   on your back on a firm bed or the floor with your legs stretched out. 2. Bend your knees so they point up to the ceiling. Your feet should be flat on the floor. 3. Cross your arms over your chest. 4. Tip your chin a little bit toward your chest but do not bend your neck. 5. Tighten your belly muscles and slowly raise your chest just enough to lift your shoulder blades a tiny bit off of the floor. 6. Slowly lower your chest and your head to the floor. Back Lifts  Do these steps 5-10 times in a row: 1. Lie on your belly (face-down) with your arms at your sides, and rest your forehead on the floor. 2. Tighten the muscles in your legs and your butt. 3. Slowly lift your chest off of the floor while you keep your hips on the floor. Keep the back of your head in line with the curve in your back.  Look at the floor while you do this. 4. Stay in this position for 3-5 seconds. 5. Slowly lower your chest and your face to the floor. Contact a doctor if:  Your back pain gets a lot worse when you do an exercise.  Your back pain does not lessen 2 hours after you exercise. If you have any of these problems, stop doing the exercises. Do not do them again unless your doctor says it is okay. Get help right away if:  You have sudden, very bad back pain. If this happens, stop doing the exercises. Do not do them again unless your doctor says it is okay. This information is not intended to replace advice given to you by your health care provider. Make sure you discuss any questions you have with your health care provider. Document Released: 03/23/2010 Document Revised: 07/27/2015 Document Reviewed: 04/14/2014  2017 [employer]  

## 2016-04-02 ENCOUNTER — Encounter: Payer: Self-pay | Admitting: Neurology

## 2016-04-25 ENCOUNTER — Telehealth: Payer: Self-pay | Admitting: Neurology

## 2016-04-25 NOTE — Telephone Encounter (Signed)
Patient wants to set up a MRI patient legs is hurting her now and please call [phone]

## 2016-04-25 NOTE — Telephone Encounter (Signed)
Please advise 

## 2016-04-25 NOTE — Telephone Encounter (Signed)
Patient needs to set up return visit to discuss.

## 2016-04-29 ENCOUNTER — Telehealth: Payer: Self-pay | Admitting: Neurology

## 2016-04-29 ENCOUNTER — Encounter: Payer: Self-pay | Admitting: Internal Medicine

## 2016-04-29 NOTE — Telephone Encounter (Signed)
Please advise 

## 2016-04-29 NOTE — Telephone Encounter (Signed)
Patient needs to be evaluated in the office, as she has not been seen in almost 1 year.  It is appropriate to request the note from her PCP, who has seen her more recently.  Kareli Hossain K. Posey Pronto, DO

## 2016-04-29 NOTE — Telephone Encounter (Signed)
Alexis Suarez 1975-04-15. She is scheduled for May for a follow up. She is on a wait list. She said she started boot camp today and would like a note saying she is not to be running? Her # is [phone]. Thank you

## 2016-04-29 NOTE — Telephone Encounter (Signed)
May 4 but on wait list.

## 2016-04-30 ENCOUNTER — Encounter: Payer: Self-pay | Admitting: *Deleted

## 2016-04-30 NOTE — Telephone Encounter (Signed)
Note sent via MyChart.  

## 2016-05-16 ENCOUNTER — Ambulatory Visit (INDEPENDENT_AMBULATORY_CARE_PROVIDER_SITE_OTHER): Payer: BLUE CROSS/BLUE SHIELD | Admitting: Neurology

## 2016-05-16 ENCOUNTER — Encounter: Payer: Self-pay | Admitting: Neurology

## 2016-05-16 VITALS — BP 110/70 | HR 67 | Ht 67.5 in | Wt 229.6 lb

## 2016-05-16 DIAGNOSIS — M79606 Pain in leg, unspecified: Secondary | ICD-10-CM | POA: Diagnosis not present

## 2016-05-16 NOTE — Progress Notes (Signed)
Follow-up Visit   Date: 05/16/16    Alexis Suarez MRN: [id number] DOB: 01/25/1976   Interim History: Alexis Suarez is a 41 y.o. right-handed African American female with no prior medical history returning to the clinic with new complaints of knee and ankle pain.  The patient was accompanied to the clinic by self.  History of present illness: Initial visit for left leg pain and numbness 06/12/2015:  Starting around early February 2017, she was walking around a track and developed left sided low back pain, radiating down her buttocks, posterior thigh, and into the knee.  Pain was sharp and severe.  She does not recall injuring her back.  She was given prednisone course x 2 which eventually resolved her pain within 3 weeks; however, since then, she has constant numbness over the same region.  She was referred to see me for evaluation, but about two weeks ago, her numbness resolved.  Now, she has episodic sharp pain over the same region which occurs randomly with exertion or even at rest about 3-4 times per week, lasting about 3-4 hours.  She has naproxen, robaxin, and valium which does not help.  She denies bowel/bladder changes, weakness, or falls.   UPDATE 05/16/2016:  The numbness/tingling of the left leg has significantly improved and now she only has it infrequently so stopped muscle relaxants. Today, she complains of two month history of right knee and bilateral ankle pain.  Pain is described as achy and worse with activity such as when she engages in bootcamp, especially with jumping and climbing stairs.   She denies any numbness/tingling or weakness. There is no swelling of redness of the joints.  She tried NSAIDs and has previously used a ankle brace, both which help.   Medications:  Current Outpatient Prescriptions on File Prior to Visit  Medication Sig Dispense Refill  . cyclobenzaprine (FLEXERIL) 5 MG tablet Take 1 tablet (5 mg total) by mouth at bedtime as needed for muscle  spasms. (Patient not taking: Reported on 05/16/2016) 20 tablet 1  . naproxen (NAPROSYN) 500 MG tablet Take 1 tablet (500 mg total) by mouth 2 (two) times daily with a meal. (Patient not taking: Reported on 05/16/2016) 30 tablet 0   No current facility-administered medications on file prior to visit.     Allergies: No Known Allergies  Review of Systems:  CONSTITUTIONAL: No fevers, chills, night sweats, or weight loss.  EYES: No visual changes or eye pain ENT: No hearing changes.  No history of nose bleeds.   RESPIRATORY: No cough, wheezing and shortness of breath.   CARDIOVASCULAR: Negative for chest pain, and palpitations.   GI: Negative for abdominal discomfort, blood in stools or black stools.  No recent change in bowel habits.   GU:  No history of incontinence.   MUSCLOSKELETAL: +history of joint pain or swelling.  No myalgias.   SKIN: Negative for lesions, rash, and itching.   ENDOCRINE: Negative for cold or heat intolerance, polydipsia or goiter.   PSYCH:  No depression or anxiety symptoms.   NEURO: As Above.   Vital Signs:  BP 110/70   Pulse 67   Ht 5' 7.5" (1.715 m)   Wt 229 lb 9 oz (104.1 kg)   LMP 04/09/2012   SpO2 100%   BMI 35.42 kg/m   Neurological Exam: MENTAL STATUS including orientation to time, place, person, recent and remote memory, attention span and concentration, language, and fund of knowledge is normal.  Speech is not dysarthric.  CRANIAL NERVES: No visual field defects. Pupils equal round and reactive to light.  Normal conjugate, extra-ocular eye movements in all directions of gaze.  No ptosis. Normal facial sensation.  Face is symmetric. Palate elevates symmetrically.  Tongue is midline.  MOTOR:  Motor strength is 5/5 in all extremities, including bilateral toe flexion.  No atrophy, fasciculations or abnormal movements.  No pronator drift.  Tone is normal.  She does not have tenderness to palpation over the knees or ankles.   MSRs:  Reflexes are 2+/4  throughout, except 1+/4 left Achilles (improved).  Plantars are down going.  SENSORY:  Intact to vibration throughout.  COORDINATION/GAIT:  Intact rapid alternating movements bilaterally.  Gait narrow based and stable.   Data: XR lumbar spine 04/25/2015:  There is no acute bony abnormality of the lumbar spine nor significant disc space narrowing. Mild curvature convex toward the left centered at L4-5 may be related to muscle spasm.   IMPRESSION/PLAN: 1.  Musculoskeletal pain (?tendonitis) of the right knee and bilateral ankle.  No features to suggest this is neurological in etiology.  Recommend taking naproxen 500mg  twice daily as needed for pain and f/u with PCP or Sports Medicine, if pain persists.  2.  Left sacral radiculopathy - improved with prednisone and flexeril. Exam also showed improved left toe flexion and return of Achilles If she develops worsening pain/paresthesias, MRI lumbar spine can be ordered.    Return to clinic as needed    The duration of this appointment visit was 20 minutes of face-to-face time with the patient.  Greater than 50% of this time was spent in counseling, explanation of diagnosis, planning of further management, and coordination of care.   Thank you for allowing me to participate in patient's care.  If I can answer any additional questions, I would be pleased to do so.    Sincerely,    Jeshawn Melucci K. Posey Pronto, DO

## 2016-05-16 NOTE — Patient Instructions (Addendum)
1.  Start naproxen 500mg  twice daily as needed for knee and ankle pain 2.  If you develop any new neurological symptoms, please come back and see me

## 2016-05-23 ENCOUNTER — Ambulatory Visit (INDEPENDENT_AMBULATORY_CARE_PROVIDER_SITE_OTHER): Payer: BLUE CROSS/BLUE SHIELD | Admitting: Internal Medicine

## 2016-05-23 ENCOUNTER — Encounter: Payer: Self-pay | Admitting: Internal Medicine

## 2016-05-23 VITALS — BP 112/70 | HR 60 | Temp 97.9°F | Wt 231.5 lb

## 2016-05-23 DIAGNOSIS — R635 Abnormal weight gain: Secondary | ICD-10-CM

## 2016-05-23 MED ORDER — PHENTERMINE HCL 37.5 MG PO CAPS: 37.5000 mg | ORAL_CAPSULE | ORAL | 0 refills | Status: DC

## 2016-05-23 NOTE — Patient Instructions (Signed)

## 2016-05-23 NOTE — Progress Notes (Signed)
Subjective:    Patient ID: Alexis Suarez, female    DOB: [birth year], 41 y.o.   MRN: [id number]  HPI  Pt presents to the clinic today to discuss restarting Phentermine. She was last on this 06/2015. Her weight today is 231 lbs with a BMI of 35.72. Breakfast: nothing Lunch: chicken burrito Dinner: barbecue chicken, green beans and corn. Exercise: She has cut out soda, beef, pork and fried food. She is doing boot camp for 1 hour, 3 days a week.  Review of Systems      Past Medical History:  Diagnosis Date  . Asthma    ED eval in the past  . Sciatica     Current Outpatient Prescriptions  Medication Sig Dispense Refill  . naproxen (NAPROSYN) 500 MG tablet Take 1 tablet (500 mg total) by mouth 2 (two) times daily with a meal. 30 tablet 0  . cyclobenzaprine (FLEXERIL) 5 MG tablet Take 1 tablet (5 mg total) by mouth at bedtime as needed for muscle spasms. (Patient not taking: Reported on 05/23/2016) 20 tablet 1   No current facility-administered medications for this visit.     No Known Allergies  Family History  Problem Relation Age of Onset  . Thyroid disease Mother   . Hypertension Sister   . Miscarriages / Stillbirths Sister   . Stroke Sister   . Diabetes Father   . Prostate cancer Paternal Grandfather   . Cancer Paternal Grandfather     Prostate    Social History   Social History  . Marital status: Single    Spouse name: N/A  . Number of children: N/A  . Years of education: N/A   Occupational History  . Not on file.   Social History Main Topics  . Smoking status: Never Smoker  . Smokeless tobacco: Never Used  . Alcohol use 0.0 oz/week     Comment: Rare  . Drug use: No  . Sexual activity: Not on file   Other Topics Concern  . Not on file   Social History Narrative   Lives in [place] with mother, son and boyfriend in a one story home. 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      Education: college degree and 1 year of grad school.      Diet - regular diet                 Constitutional: Denies fever, malaise, fatigue, headache or abrupt weight changes.  Respiratory: Denies difficulty breathing, shortness of breath, cough or sputum production.   Cardiovascular: Denies chest pain, chest tightness, palpitations or swelling in the hands or feet.  Neurological: Denies dizziness, difficulty with memory, difficulty with speech or problems with balance and coordination.    No other specific complaints in a complete review of systems (except as listed in HPI above).  Objective:   Physical Exam   BP 112/70   Pulse 60   Temp 97.9 F (36.6 C) (Oral)   Wt 231 lb 8 oz (105 kg)   LMP 04/09/2012   BMI 35.72 kg/m  Wt Readings from Last 3 Encounters:  05/23/16 231 lb 8 oz (105 kg)  05/16/16 229 lb 9 oz (104.1 kg)  03/18/16 233 lb (105.7 kg)    General: Appears her stated age, obese in NAD. Cardiovascular: Normal rate and rhythm. S1,S2 noted.  No murmur, rubs or gallops noted.  Pulmonary/Chest: Normal effort and positive vesicular breath sounds. No respiratory distress. No wheezes, rales or ronchi noted.  Neurological: Alert  and oriented.    BMET    Component Value Date/Time   NA 136 06/14/2015 1515   NA 139 02/10/2014 0516   K 4.1 06/14/2015 1515   K 3.8 02/10/2014 0516   CL 104 06/14/2015 1515   CL 103 02/10/2014 0516   CO2 27 06/14/2015 1515   CO2 30 02/10/2014 0516   GLUCOSE 79 06/14/2015 1515   GLUCOSE 76 02/10/2014 0516   BUN 14 06/14/2015 1515   BUN 5 (L) 02/10/2014 0516   CREATININE 0.73 06/14/2015 1515   CREATININE 0.75 02/10/2014 0516   CALCIUM 9.1 06/14/2015 1515   CALCIUM 7.9 (L) 02/10/2014 0516   GFRNONAA >60 02/10/2014 0516   GFRNONAA >60 10/11/2012 1547   GFRAA >60 02/10/2014 0516   GFRAA >60 10/11/2012 1547    Lipid Panel     Component Value Date/Time   CHOL 158 06/14/2015 1515   TRIG 66.0 06/14/2015 1515   HDL 43.10 06/14/2015 1515   CHOLHDL 4 06/14/2015 1515   VLDL 13.2 06/14/2015 1515   LDLCALC 101 (H)  06/14/2015 1515    CBC    Component Value Date/Time   WBC 6.3 06/14/2015 1515   RBC 4.54 06/14/2015 1515   HGB 12.4 06/14/2015 1515   HGB 8.4 (L) 02/10/2014 1750   HCT 36.9 06/14/2015 1515   HCT 25.0 (L) 02/10/2014 0516   PLT 228.0 06/14/2015 1515   PLT 125 (L) 02/10/2014 0516   MCV 81.3 06/14/2015 1515   MCV 87 02/10/2014 0516   MCH 28.4 02/10/2014 0516   MCHC 33.6 06/14/2015 1515   RDW 13.5 06/14/2015 1515   RDW 14.4 02/10/2014 0516   LYMPHSABS 1.6 02/10/2014 0516   MONOABS 0.9 02/10/2014 0516   EOSABS 0.2 02/10/2014 0516   BASOSABS 0.0 02/10/2014 0516    Hgb A1C Lab Results  Component Value Date   HGBA1C 5.5 06/14/2015           Assessment & Plan:   Abnormal Weight Gain:  Discussed high protein, low carb diet and exercise for weight loss RX for Phentermine 37.5 mg daily  RTC in 1 month for weight check/med refill Webb Silversmith, NP

## 2016-06-17 ENCOUNTER — Ambulatory Visit (INDEPENDENT_AMBULATORY_CARE_PROVIDER_SITE_OTHER): Payer: BLUE CROSS/BLUE SHIELD | Admitting: Internal Medicine

## 2016-06-17 ENCOUNTER — Encounter: Payer: Self-pay | Admitting: Internal Medicine

## 2016-06-17 VITALS — BP 114/74 | HR 80 | Temp 97.8°F | Wt 220.8 lb

## 2016-06-17 DIAGNOSIS — Z113 Encounter for screening for infections with a predominantly sexual mode of transmission: Secondary | ICD-10-CM

## 2016-06-17 NOTE — Patient Instructions (Signed)
Sexually Transmitted Disease A sexually transmitted disease (STD) is a disease or infection that may be passed (transmitted) from person to person, usually during sexual activity. This may happen by way of saliva, semen, blood, vaginal mucus, or urine. Common STDs include:  Gonorrhea.  Chlamydia.  Syphilis.  HIV and AIDS.  Genital herpes.  Hepatitis B and C.  Trichomonas.  Human papillomavirus (HPV).  Pubic lice.  Scabies.  Mites.  Bacterial vaginosis. What are the causes? An STD may be caused by bacteria, a virus, or parasites. STDs are often transmitted during sexual activity if one person is infected. However, they may also be transmitted through nonsexual means. STDs may be transmitted after:  Sexual intercourse with an infected person.  Sharing sex toys with an infected person.  Sharing needles with an infected person or using unclean piercing or tattoo needles.  Having intimate contact with the genitals, mouth, or rectal areas of an infected person.  Exposure to infected fluids during birth. What are the signs or symptoms? Different STDs have different symptoms. Some people may not have any symptoms. If symptoms are present, they may include:  Painful or bloody urination.  Pain in the pelvis, abdomen, vagina, anus, throat, or eyes.  A skin rash, itching, or irritation.  Growths, ulcerations, blisters, or sores in the genital and anal areas.  Abnormal vaginal discharge with or without bad odor.  Penile discharge in men.  Fever.  Pain or bleeding during sexual intercourse.  Swollen glands in the groin area.  Yellow skin and eyes (jaundice). This is seen with hepatitis.  Swollen testicles.  Infertility.  Sores and blisters in the mouth. How is this diagnosed? To make a diagnosis, your health care provider may:  Take a medical history.  Perform a physical exam.  Take a sample of any discharge to examine.  Swab the throat, cervix, opening to  the penis, rectum, or vagina for testing.  Test a sample of your first morning urine.  Perform blood tests.  Perform a Pap test, if this applies.  Perform a colposcopy.  Perform a laparoscopy. How is this treated? Treatment depends on the STD. Some STDs may be treated but not cured.  Chlamydia, gonorrhea, trichomonas, and syphilis can be cured with antibiotic medicine.  Genital herpes, hepatitis, and HIV can be treated, but not cured, with prescribed medicines. The medicines lessen symptoms.  Genital warts from HPV can be treated with medicine or by freezing, burning (electrocautery), or surgery. Warts may come back.  HPV cannot be cured with medicine or surgery. However, abnormal areas may be removed from the cervix, vagina, or vulva.  If your diagnosis is confirmed, your recent sexual partners need treatment. This is true even if they are symptom-free or have a negative culture or evaluation. They should not have sex until their health care providers say it is okay.  Your health care provider may test you for infection again 3 months after treatment. How is this prevented? Take these steps to reduce your risk of getting an STD:  Use latex condoms, dental dams, and water-soluble lubricants during sexual activity. Do not use petroleum jelly or oils.  Avoid having multiple sex partners.  Do not have sex with someone who has other sex partners.  Do not have sex with anyone you do not know or who is at high risk for an STD.  Avoid risky sex practices that can break your skin.  Do not have sex if you have open sores on your mouth or skin.  Avoid drinking too much alcohol or taking illegal drugs. Alcohol and drugs can affect your judgment and put you in a vulnerable position.  Avoid engaging in oral and anal sex acts.  Get vaccinated for HPV and hepatitis. If you have not received these vaccines in the past, talk to your health care provider about whether one or both might be  right for you.  If you are at risk of being infected with HIV, it is recommended that you take a prescription medicine daily to prevent HIV infection. This is called pre-exposure prophylaxis (PrEP). You are considered at risk if:  You are a man who has sex with other men (MSM).  You are a heterosexual man or woman and are sexually active with more than one partner.  You take drugs by injection.  You are sexually active with a partner who has HIV.  Talk with your health care provider about whether you are at high risk of being infected with HIV. If you choose to begin PrEP, you should first be tested for HIV. You should then be tested every 3 months for as long as you are taking PrEP. Contact a health care provider if:  See your health care provider.  Tell your sexual partner(s). They should be tested and treated for any STDs.  Do not have sex until your health care provider says it is okay. Get help right away if: Contact your health care provider right away if:  You have severe abdominal pain.  You are a man and notice swelling or pain in your testicles.  You are a woman and notice swelling or pain in your vagina. This information is not intended to replace advice given to you by your health care provider. Make sure you discuss any questions you have with your health care provider. Document Released: 05/11/2002 Document Revised: 09/08/2015 Document Reviewed: 09/08/2012 Elsevier Interactive Patient Education  2017 [employer].

## 2016-06-17 NOTE — Progress Notes (Signed)
Subjective:    Patient ID: Alexis Suarez, female    DOB: Feb 23, 1976, 41 y.o.   MRN: [id number]  HPI  Pt presents to the clinic today for STD screening. She reports she is not having any pelvic pain, vaginal discharge, odor or abnormal bleeding. She has not had any known exposure to an STD. She is sexually active with 1 person at this time, but reports they are not using any protection.  Review of Systems      Past Medical History:  Diagnosis Date  . Asthma    ED eval in the past  . Sciatica     Current Outpatient Prescriptions  Medication Sig Dispense Refill  . cyclobenzaprine (FLEXERIL) 5 MG tablet Take 1 tablet (5 mg total) by mouth at bedtime as needed for muscle spasms. 20 tablet 1  . naproxen (NAPROSYN) 500 MG tablet Take 1 tablet (500 mg total) by mouth 2 (two) times daily with a meal. 30 tablet 0  . phentermine 37.5 MG capsule Take 1 capsule (37.5 mg total) by mouth every morning. 30 capsule 0   No current facility-administered medications for this visit.     No Known Allergies  Family History  Problem Relation Age of Onset  . Thyroid disease Mother   . Hypertension Sister   . Miscarriages / Stillbirths Sister   . Stroke Sister   . Diabetes Father   . Prostate cancer Paternal Grandfather   . Cancer Paternal Grandfather     Prostate    Social History   Social History  . Marital status: Single    Spouse name: N/A  . Number of children: N/A  . Years of education: N/A   Occupational History  . Not on file.   Social History Main Topics  . Smoking status: Never Smoker  . Smokeless tobacco: Never Used  . Alcohol use 0.0 oz/week     Comment: Rare  . Drug use: No  . Sexual activity: Not on file   Other Topics Concern  . Not on file   Social History Narrative   Lives in [place] with mother, son and boyfriend in a one story home. 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      Education: college degree and 1 year of grad school.      Diet - regular diet                 Constitutional: Denies fever, malaise, fatigue, headache or abrupt weight changes.  Gastrointestinal: Denies abdominal pain, bloating, constipation, diarrhea or blood in the stool.  GU: Denies urgency, frequency, pain with urination, burning sensation, blood in urine, odor or discharge.   No other specific complaints in a complete review of systems (except as listed in HPI above).  Objective:   Physical Exam  BP 114/74   Pulse 80   Temp 97.8 F (36.6 C) (Oral)   Wt 220 lb 12 oz (100.1 kg)   LMP 04/09/2012   BMI 34.06 kg/m  Wt Readings from Last 3 Encounters:  06/17/16 220 lb 12 oz (100.1 kg)  05/23/16 231 lb 8 oz (105 kg)  05/16/16 229 lb 9 oz (104.1 kg)    General: Appears her stated age, obese in NAD. Abdomen: Soft and nontender. Normal bowel sounds. No distention or masses noted.   BMET    Component Value Date/Time   NA 136 06/14/2015 1515   NA 139 02/10/2014 0516   K 4.1 06/14/2015 1515   K 3.8 02/10/2014 0516  CL 104 06/14/2015 1515   CL 103 02/10/2014 0516   CO2 27 06/14/2015 1515   CO2 30 02/10/2014 0516   GLUCOSE 79 06/14/2015 1515   GLUCOSE 76 02/10/2014 0516   BUN 14 06/14/2015 1515   BUN 5 (L) 02/10/2014 0516   CREATININE 0.73 06/14/2015 1515   CREATININE 0.75 02/10/2014 0516   CALCIUM 9.1 06/14/2015 1515   CALCIUM 7.9 (L) 02/10/2014 0516   GFRNONAA >60 02/10/2014 0516   GFRNONAA >60 10/11/2012 1547   GFRAA >60 02/10/2014 0516   GFRAA >60 10/11/2012 1547    Lipid Panel     Component Value Date/Time   CHOL 158 06/14/2015 1515   TRIG 66.0 06/14/2015 1515   HDL 43.10 06/14/2015 1515   CHOLHDL 4 06/14/2015 1515   VLDL 13.2 06/14/2015 1515   LDLCALC 101 (H) 06/14/2015 1515    CBC    Component Value Date/Time   WBC 6.3 06/14/2015 1515   RBC 4.54 06/14/2015 1515   HGB 12.4 06/14/2015 1515   HGB 8.4 (L) 02/10/2014 1750   HCT 36.9 06/14/2015 1515   HCT 25.0 (L) 02/10/2014 0516   PLT 228.0 06/14/2015 1515   PLT 125 (L)  02/10/2014 0516   MCV 81.3 06/14/2015 1515   MCV 87 02/10/2014 0516   MCH 28.4 02/10/2014 0516   MCHC 33.6 06/14/2015 1515   RDW 13.5 06/14/2015 1515   RDW 14.4 02/10/2014 0516   LYMPHSABS 1.6 02/10/2014 0516   MONOABS 0.9 02/10/2014 0516   EOSABS 0.2 02/10/2014 0516   BASOSABS 0.0 02/10/2014 0516    Hgb A1C Lab Results  Component Value Date   HGBA1C 5.5 06/14/2015            Assessment & Plan:   Screen for STD:  Will obtain urine G/C and trichomonas today She wants HIV, RPR and HSV, but she is coming back for her annual exam tomorrow, so it will be done with her physical labs Discussed the importance of using protection outside of a monogamous relationship  Will follow up after labs Webb Silversmith, NP

## 2016-06-17 NOTE — Addendum Note (Signed)
Addended by: Marchia Bond on: 06/17/2016 01:45 PM   Modules accepted: Orders

## 2016-06-17 NOTE — Addendum Note (Signed)
Addended by: Ellamae Sia on: 06/17/2016 01:00 PM   Modules accepted: Orders

## 2016-06-18 ENCOUNTER — Encounter: Payer: Self-pay | Admitting: Internal Medicine

## 2016-06-18 ENCOUNTER — Ambulatory Visit (INDEPENDENT_AMBULATORY_CARE_PROVIDER_SITE_OTHER): Payer: BLUE CROSS/BLUE SHIELD | Admitting: Internal Medicine

## 2016-06-18 VITALS — BP 114/72 | HR 68 | Temp 97.9°F | Ht 67.0 in | Wt 218.8 lb

## 2016-06-18 DIAGNOSIS — Z0000 Encounter for general adult medical examination without abnormal findings: Secondary | ICD-10-CM | POA: Diagnosis not present

## 2016-06-18 DIAGNOSIS — Z1239 Encounter for other screening for malignant neoplasm of breast: Secondary | ICD-10-CM

## 2016-06-18 DIAGNOSIS — Z1231 Encounter for screening mammogram for malignant neoplasm of breast: Secondary | ICD-10-CM

## 2016-06-18 DIAGNOSIS — Z113 Encounter for screening for infections with a predominantly sexual mode of transmission: Secondary | ICD-10-CM

## 2016-06-18 LAB — LIPID PANEL
CHOLESTEROL: 167 mg/dL (ref 0–200)
HDL: 46.2 mg/dL (ref >39.00)
LDL CALC: 112 mg/dL — AB (ref 0–99)
NonHDL: 120.84
Total CHOL/HDL Ratio: 4
Triglycerides: 42 mg/dL (ref 0.0–149.0)
VLDL: 8.4 mg/dL (ref 0.0–40.0)

## 2016-06-18 LAB — COMPREHENSIVE METABOLIC PANEL
ALBUMIN: 4.3 g/dL (ref 3.5–5.2)
ALK PHOS: 77 U/L (ref 39–117)
ALT: 9 U/L (ref 0–35)
AST: 13 U/L (ref 0–37)
BILIRUBIN TOTAL: 0.8 mg/dL (ref 0.2–1.2)
BUN: 13 mg/dL (ref 6–23)
CO2: 29 mEq/L (ref 19–32)
CREATININE: 1.03 mg/dL (ref 0.40–1.20)
Calcium: 9.8 mg/dL (ref 8.4–10.5)
Chloride: 106 mEq/L (ref 96–112)
GFR: 76.34 mL/min (ref >60.00)
Glucose, Bld: 87 mg/dL (ref 70–99)
Potassium: 4.7 mEq/L (ref 3.5–5.1)
SODIUM: 143 meq/L (ref 135–145)
Total Protein: 7.6 g/dL (ref 6.0–8.3)

## 2016-06-18 LAB — CBC
HCT: 40.8 % (ref 36.0–46.0)
Hemoglobin: 13.1 g/dL (ref 12.0–15.0)
MCHC: 32.2 g/dL (ref 30.0–36.0)
MCV: 84.3 fl (ref 78.0–100.0)
PLATELETS: 259 10*3/uL (ref 150.0–400.0)
RBC: 4.84 Mil/uL (ref 3.87–5.11)
RDW: 12.9 % (ref 11.5–15.5)
WBC: 7.8 10*3/uL (ref 4.0–10.5)

## 2016-06-18 LAB — GC/CHLAMYDIA PROBE AMP
CT PROBE, AMP APTIMA: NOT DETECTED
GC PROBE AMP APTIMA: NOT DETECTED

## 2016-06-18 LAB — SURESWAB, T.VAGINALIS RNA,QL,FEMALE: Trichomonas vaginalis RNA: NOT DETECTED

## 2016-06-18 LAB — HEMOGLOBIN A1C: HEMOGLOBIN A1C: 5.4 % (ref 4.6–6.5)

## 2016-06-18 MED ORDER — PHENTERMINE HCL 37.5 MG PO CAPS: 37.5000 mg | ORAL_CAPSULE | ORAL | 2 refills | Status: DC

## 2016-06-18 NOTE — Progress Notes (Signed)
Subjective:    Patient ID: Alexis Suarez, female    DOB: September 28, 1975, 41 y.o.   MRN: [id number]  HPI  Pt presents to the clinic today for her annual exam.  Flu: 03/2016 Tetanus: 04/2010 Mammogram: never Pap Smear: 01/2014, partial hysterectomy Colon Screening: 07/2014 Vision Screening: annually Dentist: annually  Diet: She does eat lean meat. She consumes fruits and veggies. She does eat some fried foods. She drinks mostly lemon water and herbal tea. Exercise: She walks the track 2-3 days a week for 30 minute. She does cardio for 45 minutes 2 days a week.  She is requesting a refill of her Phentermine today.  Review of Systems      Past Medical History:  Diagnosis Date  . Asthma    ED eval in the past  . Sciatica     Current Outpatient Prescriptions  Medication Sig Dispense Refill  . cyclobenzaprine (FLEXERIL) 5 MG tablet Take 1 tablet (5 mg total) by mouth at bedtime as needed for muscle spasms. 20 tablet 1  . naproxen (NAPROSYN) 500 MG tablet Take 1 tablet (500 mg total) by mouth 2 (two) times daily with a meal. 30 tablet 0  . phentermine 37.5 MG capsule Take 1 capsule (37.5 mg total) by mouth every morning. 30 capsule 0   No current facility-administered medications for this visit.     No Known Allergies  Family History  Problem Relation Age of Onset  . Thyroid disease Mother   . Hypertension Sister   . Miscarriages / Stillbirths Sister   . Stroke Sister   . Diabetes Father   . Prostate cancer Paternal Grandfather   . Cancer Paternal Grandfather     Prostate    Social History   Social History  . Marital status: Single    Spouse name: N/A  . Number of children: N/A  . Years of education: N/A   Occupational History  . Not on file.   Social History Main Topics  . Smoking status: Never Smoker  . Smokeless tobacco: Never Used  . Alcohol use 0.0 oz/week     Comment: Rare  . Drug use: No  . Sexual activity: Not on file   Other Topics Concern  .  Not on file   Social History Narrative   Lives in [place] with mother, son and boyfriend in a one story home. 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      Education: college degree and 1 year of grad school.      Diet - regular diet                 Constitutional: Denies fever, malaise, fatigue, headache or abrupt weight changes.  HEENT: Denies eye pain, eye redness, ear pain, ringing in the ears, wax buildup, runny nose, nasal congestion, bloody nose, or sore throat. Respiratory: Denies difficulty breathing, shortness of breath, cough or sputum production.   Cardiovascular: Denies chest pain, chest tightness, palpitations or swelling in the hands or feet.  Gastrointestinal: Denies abdominal pain, bloating, constipation, diarrhea or blood in the stool.  GU: Denies urgency, frequency, pain with urination, burning sensation, blood in urine, odor or discharge. Musculoskeletal: Denies decrease in range of motion, difficulty with gait, muscle pain or joint pain and swelling.  Skin: Denies redness, rashes, lesions or ulcercations.  Neurological: Denies dizziness, difficulty with memory, difficulty with speech or problems with balance and coordination.  Psych: Denies anxiety, depression, SI/HI.  No other specific complaints in a complete review of systems (  except as listed in HPI above).  Objective:   Physical Exam   BP 114/72   Pulse 68   Temp 97.9 F (36.6 C) (Oral)   Ht 5\' 7"  (1.702 m)   Wt 218 lb 12 oz (99.2 kg)   LMP 04/09/2012   BMI 34.26 kg/m  Wt Readings from Last 3 Encounters:  06/18/16 218 lb 12 oz (99.2 kg)  06/17/16 220 lb 12 oz (100.1 kg)  05/23/16 231 lb 8 oz (105 kg)    General: Appears her stated age, obese in NAD. Skin: Warm, dry and intact.  HEENT: Head: normal shape and size; Eyes: sclera white, no icterus, conjunctiva pink, PERRLA and EOMs intact; Ears: Tm's gray and intact, normal light reflex; Throat/Mouth: Teeth present, mucosa pink and moist, no exudate,  lesions or ulcerations noted.  Neck:  Neck supple, trachea midline. No masses, lumps or thyromegaly present.  Cardiovascular: Normal rate and rhythm. S1,S2 noted.  No murmur, rubs or gallops noted. No JVD or BLE edema.  Pulmonary/Chest: Normal effort and positive vesicular breath sounds. No respiratory distress. No wheezes, rales or ronchi noted.  Abdomen: Soft and nontender. Normal bowel sounds. No distention or masses noted. Liver, spleen and kidneys non palpable. Musculoskeletal: Strength 5/5 BUE/BLE. No difficulty with gait.  Neurological: Alert and oriented. Cranial nerves II-XII grossly intact. Coordination normal.  Psychiatric: Mood and affect normal. Behavior is normal. Judgment and thought content normal.     BMET    Component Value Date/Time   NA 136 06/14/2015 1515   NA 139 02/10/2014 0516   K 4.1 06/14/2015 1515   K 3.8 02/10/2014 0516   CL 104 06/14/2015 1515   CL 103 02/10/2014 0516   CO2 27 06/14/2015 1515   CO2 30 02/10/2014 0516   GLUCOSE 79 06/14/2015 1515   GLUCOSE 76 02/10/2014 0516   BUN 14 06/14/2015 1515   BUN 5 (L) 02/10/2014 0516   CREATININE 0.73 06/14/2015 1515   CREATININE 0.75 02/10/2014 0516   CALCIUM 9.1 06/14/2015 1515   CALCIUM 7.9 (L) 02/10/2014 0516   GFRNONAA >60 02/10/2014 0516   GFRNONAA >60 10/11/2012 1547   GFRAA >60 02/10/2014 0516   GFRAA >60 10/11/2012 1547    Lipid Panel     Component Value Date/Time   CHOL 158 06/14/2015 1515   TRIG 66.0 06/14/2015 1515   HDL 43.10 06/14/2015 1515   CHOLHDL 4 06/14/2015 1515   VLDL 13.2 06/14/2015 1515   LDLCALC 101 (H) 06/14/2015 1515    CBC    Component Value Date/Time   WBC 6.3 06/14/2015 1515   RBC 4.54 06/14/2015 1515   HGB 12.4 06/14/2015 1515   HGB 8.4 (L) 02/10/2014 1750   HCT 36.9 06/14/2015 1515   HCT 25.0 (L) 02/10/2014 0516   PLT 228.0 06/14/2015 1515   PLT 125 (L) 02/10/2014 0516   MCV 81.3 06/14/2015 1515   MCV 87 02/10/2014 0516   MCH 28.4 02/10/2014 0516   MCHC  33.6 06/14/2015 1515   RDW 13.5 06/14/2015 1515   RDW 14.4 02/10/2014 0516   LYMPHSABS 1.6 02/10/2014 0516   MONOABS 0.9 02/10/2014 0516   EOSABS 0.2 02/10/2014 0516   BASOSABS 0.0 02/10/2014 0516    Hgb A1C Lab Results  Component Value Date   HGBA1C 5.5 06/14/2015           Assessment & Plan:   Preventative Health Maintenance:  Flu and tetanus UTD Mammogram ordered- she will [facility] to schedule, number provided Pelvic  exam due 2020 Encouraged her to consume a balanced diet and exercise regimen Advised her to continue to see an eye doctor and dentist annually Will check CBC, CMET, Lipid, A1C, HIV and RPR today  Phentermine RX given today  RTC in 3 months for weight check/med refill Webb Silversmith, NP

## 2016-06-18 NOTE — Patient Instructions (Signed)
Health Maintenance, Female Adopting a healthy lifestyle and getting preventive care can go a long way to promote health and wellness. Talk with your health care provider about what schedule of regular examinations is right for you. This is a good chance for you to check in with your provider about disease prevention and staying healthy. In between checkups, there are plenty of things you can do on your own. Experts have done a lot of research about which lifestyle changes and preventive measures are most likely to keep you healthy. Ask your health care provider for more information. Weight and diet Eat a healthy diet  Be sure to include plenty of vegetables, fruits, low-fat dairy products, and lean protein.  Do not eat a lot of foods high in solid fats, added sugars, or salt.  Get regular exercise. This is one of the most important things you can do for your health.  Most adults should exercise for at least 150 minutes each week. The exercise should increase your heart rate and make you sweat (moderate-intensity exercise).  Most adults should also do strengthening exercises at least twice a week. This is in addition to the moderate-intensity exercise. Maintain a healthy weight  Body mass index (BMI) is a measurement that can be used to identify possible weight problems. It estimates body fat based on height and weight. Your health care provider can help determine your BMI and help you achieve or maintain a healthy weight.  For females 76 years of age and older:  A BMI below 18.5 is considered underweight.  A BMI of 18.5 to 24.9 is normal.  A BMI of 25 to 29.9 is considered overweight.  A BMI of 30 and above is considered obese. Watch levels of cholesterol and blood lipids  You should start having your blood tested for lipids and cholesterol at 40 years of age, then have this test every 5 years.  You may need to have your cholesterol levels checked more often if:  Your lipid or  cholesterol levels are high.  You are older than 40 years of age.  You are at high risk for heart disease. Cancer screening Lung Cancer  Lung cancer screening is recommended for adults 64-42 years old who are at high risk for lung cancer because of a history of smoking.  A yearly low-dose CT scan of the lungs is recommended for people who:  Currently smoke.  Have quit within the past 15 years.  Have at least a 30-pack-year history of smoking. A pack year is smoking an average of one pack of cigarettes a day for 1 year.  Yearly screening should continue until it has been 15 years since you quit.  Yearly screening should stop if you develop a health problem that would prevent you from having lung cancer treatment. Breast Cancer  Practice breast self-awareness. This means understanding how your breasts normally appear and feel.  It also means doing regular breast self-exams. Let your health care provider know about any changes, no matter how small.  If you are in your 20s or 30s, you should have a clinical breast exam (CBE) by a health care provider every 1-3 years as part of a regular health exam.  If you are 34 or older, have a CBE every year. Also consider having a breast X-ray (mammogram) every year.  If you have a family history of breast cancer, talk to your health care provider about genetic screening.  If you are at high risk for breast cancer, talk  to your health care provider about having an MRI and a mammogram every year.  Breast cancer gene (BRCA) assessment is recommended for women who have family members with BRCA-related cancers. BRCA-related cancers include:  Breast.  Ovarian.  Tubal.  Peritoneal cancers.  Results of the assessment will determine the need for genetic counseling and BRCA1 and BRCA2 testing. Cervical Cancer  Your health care provider may recommend that you be screened regularly for cancer of the pelvic organs (ovaries, uterus, and vagina).  This screening involves a pelvic examination, including checking for microscopic changes to the surface of your cervix (Pap test). You may be encouraged to have this screening done every 3 years, beginning at age 24.  For women ages 66-65, health care providers may recommend pelvic exams and Pap testing every 3 years, or they may recommend the Pap and pelvic exam, combined with testing for human papilloma virus (HPV), every 5 years. Some types of HPV increase your risk of cervical cancer. Testing for HPV may also be done on women of any age with unclear Pap test results.  Other health care providers may not recommend any screening for nonpregnant women who are considered low risk for pelvic cancer and who do not have symptoms. Ask your health care provider if a screening pelvic exam is right for you.  If you have had past treatment for cervical cancer or a condition that could lead to cancer, you need Pap tests and screening for cancer for at least 20 years after your treatment. If Pap tests have been discontinued, your risk factors (such as having a new sexual partner) need to be reassessed to determine if screening should resume. Some women have medical problems that increase the chance of getting cervical cancer. In these cases, your health care provider may recommend more frequent screening and Pap tests. Colorectal Cancer  This type of cancer can be detected and often prevented.  Routine colorectal cancer screening usually begins at 40 years of age and continues through 40 years of age.  Your health care provider may recommend screening at an earlier age if you have risk factors for colon cancer.  Your health care provider may also recommend using home test kits to check for hidden blood in the stool.  A small camera at the end of a tube can be used to examine your colon directly (sigmoidoscopy or colonoscopy). This is done to check for the earliest forms of colorectal cancer.  Routine  screening usually begins at age 41.  Direct examination of the colon should be repeated every 5-10 years through 40 years of age. However, you may need to be screened more often if early forms of precancerous polyps or small growths are found. Skin Cancer  Check your skin from head to toe regularly.  Tell your health care provider about any new moles or changes in moles, especially if there is a change in a mole's shape or color.  Also tell your health care provider if you have a mole that is larger than the size of a pencil eraser.  Always use sunscreen. Apply sunscreen liberally and repeatedly throughout the day.  Protect yourself by wearing long sleeves, pants, a wide-brimmed hat, and sunglasses whenever you are outside. Heart disease, diabetes, and high blood pressure  High blood pressure causes heart disease and increases the risk of stroke. High blood pressure is more likely to develop in:  People who have blood pressure in the high end of the normal range (130-139/85-89 mm Hg).  People who are overweight or obese.  People who are African American.  If you are 59-24 years of age, have your blood pressure checked every 3-5 years. If you are 34 years of age or older, have your blood pressure checked every year. You should have your blood pressure measured twice-once when you are at a hospital or clinic, and once when you are not at a hospital or clinic. Record the average of the two measurements. To check your blood pressure when you are not at a hospital or clinic, you can use:  An automated blood pressure machine at a pharmacy.  A home blood pressure monitor.  If you are between 29 years and 60 years old, ask your health care provider if you should take aspirin to prevent strokes.  Have regular diabetes screenings. This involves taking a blood sample to check your fasting blood sugar level.  If you are at a normal weight and have a low risk for diabetes, have this test once  every three years after 40 years of age.  If you are overweight and have a high risk for diabetes, consider being tested at a younger age or more often. Preventing infection Hepatitis B  If you have a higher risk for hepatitis B, you should be screened for this virus. You are considered at high risk for hepatitis B if:  You were born in a country where hepatitis B is common. Ask your health care provider which countries are considered high risk.  Your parents were born in a high-risk country, and you have not been immunized against hepatitis B (hepatitis B vaccine).  You have HIV or AIDS.  You use needles to inject street drugs.  You live with someone who has hepatitis B.  You have had sex with someone who has hepatitis B.  You get hemodialysis treatment.  You take certain medicines for conditions, including cancer, organ transplantation, and autoimmune conditions. Hepatitis C  Blood testing is recommended for:  Everyone born from [birth year] through 1964.  Anyone with known risk factors for hepatitis C. Sexually transmitted infections (STIs)  You should be screened for sexually transmitted infections (STIs) including gonorrhea and chlamydia if:  You are sexually active and are younger than 40 years of age.  You are older than 40 years of age and your health care provider tells you that you are at risk for this type of infection.  Your sexual activity has changed since you were last screened and you are at an increased risk for chlamydia or gonorrhea. Ask your health care provider if you are at risk.  If you do not have HIV, but are at risk, it may be recommended that you take a prescription medicine daily to prevent HIV infection. This is called pre-exposure prophylaxis (PrEP). You are considered at risk if:  You are sexually active and do not regularly use condoms or know the HIV status of your partner(s).  You take drugs by injection.  You are sexually active with a partner  who has HIV. Talk with your health care provider about whether you are at high risk of being infected with HIV. If you choose to begin PrEP, you should first be tested for HIV. You should then be tested every 3 months for as long as you are taking PrEP. Pregnancy  If you are premenopausal and you may become pregnant, ask your health care provider about preconception counseling.  If you may become pregnant, take 400 to 800 micrograms (mcg) of folic acid  every day.  If you want to prevent pregnancy, talk to your health care provider about birth control (contraception). Osteoporosis and menopause  Osteoporosis is a disease in which the bones lose minerals and strength with aging. This can result in serious bone fractures. Your risk for osteoporosis can be identified using a bone density scan.  If you are 4 years of age or older, or if you are at risk for osteoporosis and fractures, ask your health care provider if you should be screened.  Ask your health care provider whether you should take a calcium or vitamin D supplement to lower your risk for osteoporosis.  Menopause may have certain physical symptoms and risks.  Hormone replacement therapy may reduce some of these symptoms and risks. Talk to your health care provider about whether hormone replacement therapy is right for you. Follow these instructions at home:  Schedule regular health, dental, and eye exams.  Stay current with your immunizations.  Do not use any tobacco products including cigarettes, chewing tobacco, or electronic cigarettes.  If you are pregnant, do not drink alcohol.  If you are breastfeeding, limit how much and how often you drink alcohol.  Limit alcohol intake to no more than 1 drink per day for nonpregnant women. One drink equals 12 ounces of beer, 5 ounces of wine, or 1 ounces of hard liquor.  Do not use street drugs.  Do not share needles.  Ask your health care provider for help if you need support  or information about quitting drugs.  Tell your health care provider if you often feel depressed.  Tell your health care provider if you have ever been abused or do not feel safe at home. This information is not intended to replace advice given to you by your health care provider. Make sure you discuss any questions you have with your health care provider. Document Released: 09/03/2010 Document Revised: 07/27/2015 Document Reviewed: 11/22/2014 Elsevier Interactive Patient Education  2017 [employer].

## 2016-06-19 LAB — HIV ANTIBODY (ROUTINE TESTING W REFLEX): HIV 1&2 Ab, 4th Generation: NONREACTIVE

## 2016-06-20 LAB — RPR

## 2016-06-21 ENCOUNTER — Other Ambulatory Visit: Payer: Self-pay | Admitting: Internal Medicine

## 2016-06-21 DIAGNOSIS — Z1231 Encounter for screening mammogram for malignant neoplasm of breast: Secondary | ICD-10-CM

## 2016-07-05 ENCOUNTER — Ambulatory Visit: Payer: BLUE CROSS/BLUE SHIELD | Admitting: Neurology

## 2016-07-16 DIAGNOSIS — L700 Acne vulgaris: Secondary | ICD-10-CM | POA: Diagnosis not present

## 2016-07-16 DIAGNOSIS — T07XXXA Unspecified multiple injuries, initial encounter: Secondary | ICD-10-CM | POA: Diagnosis not present

## 2016-07-17 ENCOUNTER — Ambulatory Visit: Payer: BLUE CROSS/BLUE SHIELD

## 2016-08-01 ENCOUNTER — Ambulatory Visit
Admission: RE | Admit: 2016-08-01 | Discharge: 2016-08-01 | Disposition: A | Discharge status: Home / Self Care | Payer: BLUE CROSS/BLUE SHIELD | Source: Ambulatory Visit | Attending: Internal Medicine | Admitting: Internal Medicine

## 2016-08-01 DIAGNOSIS — Z1231 Encounter for screening mammogram for malignant neoplasm of breast: Secondary | ICD-10-CM

## 2016-09-30 ENCOUNTER — Encounter: Payer: Self-pay | Admitting: Internal Medicine

## 2016-09-30 ENCOUNTER — Ambulatory Visit (INDEPENDENT_AMBULATORY_CARE_PROVIDER_SITE_OTHER): Payer: BLUE CROSS/BLUE SHIELD | Admitting: Internal Medicine

## 2016-09-30 VITALS — BP 120/76 | HR 64 | Temp 97.9°F | Wt 215.0 lb

## 2016-09-30 DIAGNOSIS — R635 Abnormal weight gain: Secondary | ICD-10-CM

## 2016-09-30 MED ORDER — PHENTERMINE HCL 37.5 MG PO CAPS: 37.5000 mg | ORAL_CAPSULE | ORAL | 2 refills | Status: DC

## 2016-09-30 NOTE — Progress Notes (Signed)
Subjective:    Patient ID: Alexis Suarez, female    DOB: March 28, 1975, 41 y.o.   MRN: [id number]  HPI  Pt presents to the clinic today for 3 month follow up for weight check and med refill. She was started on Phentermine 05/2016. Her starting weight was 231.5 lbs. Her weight today is 215 lbs with a BMI of 33.67. She has lost a total of 3 lbs over the last 3 months. She reports part of her issue is that she really doesn't eat much breakfast. She has also been working a lot and she has not been getting as much exercise in.   Review of Systems      Past Medical History:  Diagnosis Date  . Asthma    ED eval in the past  . Sciatica     Current Outpatient Prescriptions  Medication Sig Dispense Refill  . cyclobenzaprine (FLEXERIL) 5 MG tablet Take 1 tablet (5 mg total) by mouth at bedtime as needed for muscle spasms. 20 tablet 1  . naproxen (NAPROSYN) 500 MG tablet Take 1 tablet (500 mg total) by mouth 2 (two) times daily with a meal. 30 tablet 0  . phentermine 37.5 MG capsule Take 1 capsule (37.5 mg total) by mouth every morning. 30 capsule 2   No current facility-administered medications for this visit.     No Known Allergies  Family History  Problem Relation Age of Onset  . Thyroid disease Mother   . Hypertension Sister   . Miscarriages / Stillbirths Sister   . Stroke Sister   . Diabetes Father   . Prostate cancer Paternal Grandfather   . Cancer Paternal Grandfather        Prostate    Social History   Social History  . Marital status: Single    Spouse name: N/A  . Number of children: N/A  . Years of education: N/A   Occupational History  . Not on file.   Social History Main Topics  . Smoking status: Never Smoker  . Smokeless tobacco: Never Used  . Alcohol use 0.0 oz/week     Comment: Rare  . Drug use: No  . Sexual activity: Not on file   Other Topics Concern  . Not on file   Social History Narrative   Lives in [place] with mother, son and boyfriend in a one  story home. 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      Education: college degree and 1 year of grad school.      Diet - regular diet                 Constitutional: Denies fever, malaise, fatigue, headache or abrupt weight changes.  Respiratory: Denies difficulty breathing, shortness of breath, cough or sputum production.   Cardiovascular: Denies chest pain, chest tightness, palpitations or swelling in the hands or feet.    No other specific complaints in a complete review of systems (except as listed in HPI above).  Objective:   Physical Exam  BP 120/76   Pulse 64   Temp 97.9 F (36.6 C) (Oral)   Wt 215 lb (97.5 kg)   LMP 04/09/2012   BMI 33.67 kg/m  Wt Readings from Last 3 Encounters:  09/30/16 215 lb (97.5 kg)  06/18/16 218 lb 12 oz (99.2 kg)  06/17/16 220 lb 12 oz (100.1 kg)    General: Appears her stated age, obese, in NAD.  Cardiovascular: Normal rate and rhythm.  Pulmonary/Chest: Normal effort and positive vesicular breath  sounds. No respiratory distress. No wheezes, rales or ronchi noted.  Neurological: Alert and oriented.   BMET    Component Value Date/Time   NA 143 06/18/2016 1116   NA 139 02/10/2014 0516   K 4.7 06/18/2016 1116   K 3.8 02/10/2014 0516   CL 106 06/18/2016 1116   CL 103 02/10/2014 0516   CO2 29 06/18/2016 1116   CO2 30 02/10/2014 0516   GLUCOSE 87 06/18/2016 1116   GLUCOSE 76 02/10/2014 0516   BUN 13 06/18/2016 1116   BUN 5 (L) 02/10/2014 0516   CREATININE 1.03 06/18/2016 1116   CREATININE 0.75 02/10/2014 0516   CALCIUM 9.8 06/18/2016 1116   CALCIUM 7.9 (L) 02/10/2014 0516   GFRNONAA >60 02/10/2014 0516   GFRNONAA >60 10/11/2012 1547   GFRAA >60 02/10/2014 0516   GFRAA >60 10/11/2012 1547    Lipid Panel     Component Value Date/Time   CHOL 167 06/18/2016 1116   TRIG 42.0 06/18/2016 1116   HDL 46.20 06/18/2016 1116   CHOLHDL 4 06/18/2016 1116   VLDL 8.4 06/18/2016 1116   LDLCALC 112 (H) 06/18/2016 1116    CBC      Component Value Date/Time   WBC 7.8 06/18/2016 1116   RBC 4.84 06/18/2016 1116   HGB 13.1 06/18/2016 1116   HGB 8.4 (L) 02/10/2014 1750   HCT 40.8 06/18/2016 1116   HCT 25.0 (L) 02/10/2014 0516   PLT 259.0 06/18/2016 1116   PLT 125 (L) 02/10/2014 0516   MCV 84.3 06/18/2016 1116   MCV 87 02/10/2014 0516   MCH 28.4 02/10/2014 0516   MCHC 32.2 06/18/2016 1116   RDW 12.9 06/18/2016 1116   RDW 14.4 02/10/2014 0516   LYMPHSABS 1.6 02/10/2014 0516   MONOABS 0.9 02/10/2014 0516   EOSABS 0.2 02/10/2014 0516   BASOSABS 0.0 02/10/2014 0516    Hgb A1C Lab Results  Component Value Date   HGBA1C 5.4 06/18/2016            Assessment & Plan:   Abnormal Weight Gain:  Advised her to eat something small for breakfast to kick start the metabolism Increase your activity to to 150 minutes per week RX for Phentermine provided today  RTC in 3 months for weight check/med refill Webb Silversmith, NP

## 2016-09-30 NOTE — Patient Instructions (Signed)
Exercising to Lose Weight Exercising can help you to lose weight. In order to lose weight through exercise, you need to do vigorous-intensity exercise. You can tell that you are exercising with vigorous intensity if you are breathing very hard and fast and cannot hold a conversation while exercising. Moderate-intensity exercise helps to maintain your current weight. You can tell that you are exercising at a moderate level if you have a higher heart rate and faster breathing, but you are still able to hold a conversation. How often should I exercise? Choose an activity that you enjoy and set realistic goals. Your health care provider can help you to make an activity plan that works for you. Exercise regularly as directed by your health care provider. This may include:  Doing resistance training twice each week, such as: ? Push-ups. ? Sit-ups. ? Lifting weights. ? Using resistance bands.  Doing a given intensity of exercise for a given amount of time. Choose from these options: ? 150 minutes of moderate-intensity exercise every week. ? 75 minutes of vigorous-intensity exercise every week. ? A mix of moderate-intensity and vigorous-intensity exercise every week.  Children, pregnant women, people who are out of shape, people who are overweight, and older adults may need to consult a health care provider for individual recommendations. If you have any sort of medical condition, be sure to consult your health care provider before starting a new exercise program. What are some activities that can help me to lose weight?  Walking at a rate of at least 4.5 miles an hour.  Jogging or running at a rate of 5 miles per hour.  Biking at a rate of at least 10 miles per hour.  Lap swimming.  Roller-skating or in-line skating.  Cross-country skiing.  Vigorous competitive sports, such as football, basketball, and soccer.  Jumping rope.  Aerobic dancing. How can I be more active in my day-to-day  activities?  Use the stairs instead of the elevator.  Take a walk during your lunch break.  If you drive, park your car farther away from work or school.  If you take public transportation, get off one stop early and walk the rest of the way.  Make all of your phone calls while standing up and walking around.  Get up, stretch, and walk around every 30 minutes throughout the day. What guidelines should I follow while exercising?  Do not exercise so much that you hurt yourself, feel dizzy, or get very short of breath.  Consult your health care provider prior to starting a new exercise program.  Wear comfortable clothes and shoes with good support.  Drink plenty of water while you exercise to prevent dehydration or heat stroke. Body water is lost during exercise and must be replaced.  Work out until you breathe faster and your heart beats faster. This information is not intended to replace advice given to you by your health care provider. Make sure you discuss any questions you have with your health care provider. Document Released: 03/23/2010 Document Revised: 07/27/2015 Document Reviewed: 07/22/2013 Elsevier Interactive Patient Education  2018 Else[employer] Inc[employer]  

## 2016-10-12 ENCOUNTER — Encounter: Payer: Self-pay | Admitting: Internal Medicine

## 2016-11-11 ENCOUNTER — Encounter: Payer: Self-pay | Admitting: Internal Medicine

## 2016-11-14 ENCOUNTER — Ambulatory Visit (INDEPENDENT_AMBULATORY_CARE_PROVIDER_SITE_OTHER): Payer: BLUE CROSS/BLUE SHIELD | Admitting: Internal Medicine

## 2016-11-14 ENCOUNTER — Encounter: Payer: Self-pay | Admitting: Internal Medicine

## 2016-11-14 VITALS — BP 124/78 | HR 80 | Temp 97.9°F | Wt 215.0 lb

## 2016-11-14 DIAGNOSIS — L298 Other pruritus: Secondary | ICD-10-CM

## 2016-11-14 DIAGNOSIS — Z113 Encounter for screening for infections with a predominantly sexual mode of transmission: Secondary | ICD-10-CM

## 2016-11-14 DIAGNOSIS — N898 Other specified noninflammatory disorders of vagina: Secondary | ICD-10-CM

## 2016-11-14 DIAGNOSIS — B373 Candidiasis of vulva and vagina: Secondary | ICD-10-CM | POA: Diagnosis not present

## 2016-11-14 DIAGNOSIS — B3731 Acute candidiasis of vulva and vagina: Secondary | ICD-10-CM

## 2016-11-14 MED ORDER — FLUCONAZOLE 150 MG PO TABS: 150.0000 mg | ORAL_TABLET | Freq: Once | ORAL | 0 refills | Status: AC

## 2016-11-14 NOTE — Patient Instructions (Signed)

## 2016-11-14 NOTE — Progress Notes (Signed)
Subjective:    Patient ID: Alexis Suarez, female    DOB: Feb 11, 1976, 41 y.o.   MRN: [id number]  HPI  Pt presents to the clinic today with vaginal discharge and itching. This started 2 weeks ago. The discharge is thick and white. She denies pelvic pain or abnormal bleeding. She has not taken anything OTC for her symptoms. She also wants to be screened for STD's. She recently got out of a relationship and just wants to be checked. She has not had any known exposure that she is aware of.  Review of Systems      Past Medical History:  Diagnosis Date  . Asthma    ED eval in the past  . Sciatica     Current Outpatient Prescriptions  Medication Sig Dispense Refill  . cyclobenzaprine (FLEXERIL) 5 MG tablet Take 1 tablet (5 mg total) by mouth at bedtime as needed for muscle spasms. 20 tablet 1  . naproxen (NAPROSYN) 500 MG tablet Take 1 tablet (500 mg total) by mouth 2 (two) times daily with a meal. 30 tablet 0  . phentermine 37.5 MG capsule Take 1 capsule (37.5 mg total) by mouth every morning. 30 capsule 2   No current facility-administered medications for this visit.     No Known Allergies  Family History  Problem Relation Age of Onset  . Thyroid disease Mother   . Hypertension Sister   . Miscarriages / Stillbirths Sister   . Stroke Sister   . Diabetes Father   . Prostate cancer Paternal Grandfather   . Cancer Paternal Grandfather        Prostate    Social History   Social History  . Marital status: Single    Spouse name: N/A  . Number of children: N/A  . Years of education: N/A   Occupational History  . Not on file.   Social History Main Topics  . Smoking status: Never Smoker  . Smokeless tobacco: Never Used  . Alcohol use 0.0 oz/week     Comment: Rare  . Drug use: No  . Sexual activity: Not on file   Other Topics Concern  . Not on file   Social History Narrative   Lives in [place] with mother, son and boyfriend in a one story home. Works for a Development worker, community.      Education: college degree and 1 year of grad school.      Diet - regular diet                 Constitutional: Denies fever, malaise, fatigue, headache or abrupt weight changes.  Gastrointestinal: Denies abdominal pain, bloating, constipation, diarrhea or blood in the stool.  GU: Pt reports vaginal discharge and itching. Denies urgency, frequency, pain with urination, burning sensation, blood in urine, odor.  No other specific complaints in a complete review of systems (except as listed in HPI above).  Objective:   Physical Exam  BP 124/78   Pulse 80   Temp 97.9 F (36.6 C) (Oral)   Wt 215 lb (97.5 kg)   LMP 04/09/2012   BMI 33.67 kg/m  Wt Readings from Last 3 Encounters:  11/14/16 215 lb (97.5 kg)  09/30/16 215 lb (97.5 kg)  06/18/16 218 lb 12 oz (99.2 kg)    General: Appears her stated age, in NAD. Abdomen: Soft and nontender. Normal bowel sounds. No distention or masses noted. Pelvic: Self swabbed.   BMET    Component Value Date/Time   NA 143  06/18/2016 1116   NA 139 02/10/2014 0516   K 4.7 06/18/2016 1116   K 3.8 02/10/2014 0516   CL 106 06/18/2016 1116   CL 103 02/10/2014 0516   CO2 29 06/18/2016 1116   CO2 30 02/10/2014 0516   GLUCOSE 87 06/18/2016 1116   GLUCOSE 76 02/10/2014 0516   BUN 13 06/18/2016 1116   BUN 5 (L) 02/10/2014 0516   CREATININE 1.03 06/18/2016 1116   CREATININE 0.75 02/10/2014 0516   CALCIUM 9.8 06/18/2016 1116   CALCIUM 7.9 (L) 02/10/2014 0516   GFRNONAA >60 02/10/2014 0516   GFRNONAA >60 10/11/2012 1547   GFRAA >60 02/10/2014 0516   GFRAA >60 10/11/2012 1547    Lipid Panel     Component Value Date/Time   CHOL 167 06/18/2016 1116   TRIG 42.0 06/18/2016 1116   HDL 46.20 06/18/2016 1116   CHOLHDL 4 06/18/2016 1116   VLDL 8.4 06/18/2016 1116   LDLCALC 112 (H) 06/18/2016 1116    CBC    Component Value Date/Time   WBC 7.8 06/18/2016 1116   RBC 4.84 06/18/2016 1116   HGB 13.1 06/18/2016 1116   HGB  8.4 (L) 02/10/2014 1750   HCT 40.8 06/18/2016 1116   HCT 25.0 (L) 02/10/2014 0516   PLT 259.0 06/18/2016 1116   PLT 125 (L) 02/10/2014 0516   MCV 84.3 06/18/2016 1116   MCV 87 02/10/2014 0516   MCH 28.4 02/10/2014 0516   MCHC 32.2 06/18/2016 1116   RDW 12.9 06/18/2016 1116   RDW 14.4 02/10/2014 0516   LYMPHSABS 1.6 02/10/2014 0516   MONOABS 0.9 02/10/2014 0516   EOSABS 0.2 02/10/2014 0516   BASOSABS 0.0 02/10/2014 0516    Hgb A1C Lab Results  Component Value Date   HGBA1C 5.4 06/18/2016            Assessment & Plan:   Vaginal Discharge and Itching secondary to Vaginal Candida:  Wet prep: + yeast eRx for Diflucan 150 mg PO x 1, repeat in 3 days if needed Avoid douching, bubble baths, scented soaps or Summer's Eve products  Screen for STD:  Urine G/C today  Return precautions discussed Webb Silversmith, NP

## 2016-11-18 ENCOUNTER — Encounter: Payer: Self-pay | Admitting: Internal Medicine

## 2016-11-19 ENCOUNTER — Other Ambulatory Visit: Payer: BLUE CROSS/BLUE SHIELD

## 2016-11-19 DIAGNOSIS — Z113 Encounter for screening for infections with a predominantly sexual mode of transmission: Secondary | ICD-10-CM | POA: Diagnosis not present

## 2016-11-19 NOTE — Addendum Note (Signed)
Addended by: Lurlean Nanny on: 11/19/2016 09:43 AM   Modules accepted: Orders

## 2016-11-20 LAB — C. TRACHOMATIS/N. GONORRHOEAE RNA
C. TRACHOMATIS RNA, TMA: NOT DETECTED
N. GONORRHOEAE RNA, TMA: NOT DETECTED

## 2017-01-09 ENCOUNTER — Ambulatory Visit: Payer: BLUE CROSS/BLUE SHIELD | Admitting: Internal Medicine

## 2017-01-09 DIAGNOSIS — Z0289 Encounter for other administrative examinations: Secondary | ICD-10-CM

## 2017-01-13 ENCOUNTER — Encounter: Payer: Self-pay | Admitting: Internal Medicine

## 2017-01-16 ENCOUNTER — Ambulatory Visit (INDEPENDENT_AMBULATORY_CARE_PROVIDER_SITE_OTHER): Payer: BLUE CROSS/BLUE SHIELD | Admitting: Internal Medicine

## 2017-01-16 ENCOUNTER — Encounter: Payer: Self-pay | Admitting: Internal Medicine

## 2017-01-16 VITALS — BP 122/80 | HR 88 | Temp 98.2°F | Wt 217.0 lb

## 2017-01-16 DIAGNOSIS — R635 Abnormal weight gain: Secondary | ICD-10-CM

## 2017-01-16 DIAGNOSIS — Z23 Encounter for immunization: Secondary | ICD-10-CM

## 2017-01-16 MED ORDER — PHENTERMINE HCL 37.5 MG PO CAPS: 37.5000 mg | ORAL_CAPSULE | ORAL | 2 refills | Status: DC

## 2017-01-16 NOTE — Patient Instructions (Signed)
Exercising to Lose Weight Exercising can help you to lose weight. In order to lose weight through exercise, you need to do vigorous-intensity exercise. You can tell that you are exercising with vigorous intensity if you are breathing very hard and fast and cannot hold a conversation while exercising. Moderate-intensity exercise helps to maintain your current weight. You can tell that you are exercising at a moderate level if you have a higher heart rate and faster breathing, but you are still able to hold a conversation. How often should I exercise? Choose an activity that you enjoy and set realistic goals. Your health care provider can help you to make an activity plan that works for you. Exercise regularly as directed by your health care provider. This may include:  Doing resistance training twice each week, such as: ? Push-ups. ? Sit-ups. ? Lifting weights. ? Using resistance bands.  Doing a given intensity of exercise for a given amount of time. Choose from these options: ? 150 minutes of moderate-intensity exercise every week. ? 75 minutes of vigorous-intensity exercise every week. ? A mix of moderate-intensity and vigorous-intensity exercise every week.  Children, pregnant women, people who are out of shape, people who are overweight, and older adults may need to consult a health care provider for individual recommendations. If you have any sort of medical condition, be sure to consult your health care provider before starting a new exercise program. What are some activities that can help me to lose weight?  Walking at a rate of at least 4.5 miles an hour.  Jogging or running at a rate of 5 miles per hour.  Biking at a rate of at least 10 miles per hour.  Lap swimming.  Roller-skating or in-line skating.  Cross-country skiing.  Vigorous competitive sports, such as football, basketball, and soccer.  Jumping rope.  Aerobic dancing. How can I be more active in my day-to-day  activities?  Use the stairs instead of the elevator.  Take a walk during your lunch break.  If you drive, park your car farther away from work or school.  If you take public transportation, get off one stop early and walk the rest of the way.  Make all of your phone calls while standing up and walking around.  Get up, stretch, and walk around every 30 minutes throughout the day. What guidelines should I follow while exercising?  Do not exercise so much that you hurt yourself, feel dizzy, or get very short of breath.  Consult your health care provider prior to starting a new exercise program.  Wear comfortable clothes and shoes with good support.  Drink plenty of water while you exercise to prevent dehydration or heat stroke. Body water is lost during exercise and must be replaced.  Work out until you breathe faster and your heart beats faster. This information is not intended to replace advice given to you by your health care provider. Make sure you discuss any questions you have with your health care provider. Document Released: 03/23/2010 Document Revised: 07/27/2015 Document Reviewed: 07/22/2013 Elsevier Interactive Patient Education  2018 Else[employer] Inc[employer]  

## 2017-01-16 NOTE — Progress Notes (Signed)
Subjective:    Patient ID: Alexis Suarez, female    DOB: February 13, 1976, 41 y.o.   MRN: [id number]  HPI  Pt presents to the clinic today to request getting started back on Phentermine. She has not taken this in the last 4 months. Her weight today is 217 lbs with a BMI of 33.99.  Breakfast: Sausage and eggs or oatmeal Lunch: Chicken and Dumplings, Mac and Cheese, Collards Dinner: She often skips dinner, steak and baked potato Snacks: She reports she is not snacking.  Exercise: She is not exercising since it has been cold.  Review of Systems      Past Medical History:  Diagnosis Date  . Asthma    ED eval in the past  . Sciatica     Current Outpatient Medications  Medication Sig Dispense Refill  . cyclobenzaprine (FLEXERIL) 5 MG tablet Take 1 tablet (5 mg total) by mouth at bedtime as needed for muscle spasms. 20 tablet 1  . naproxen (NAPROSYN) 500 MG tablet Take 1 tablet (500 mg total) by mouth 2 (two) times daily with a meal. 30 tablet 0  . phentermine 37.5 MG capsule Take 1 capsule (37.5 mg total) by mouth every morning. 30 capsule 2   No current facility-administered medications for this visit.     No Known Allergies  Family History  Problem Relation Age of Onset  . Thyroid disease Mother   . Hypertension Sister   . Miscarriages / Stillbirths Sister   . Stroke Sister   . Diabetes Father   . Prostate cancer Paternal Grandfather   . Cancer Paternal Grandfather        Prostate    Social History   Socioeconomic History  . Marital status: Single    Spouse name: Not on file  . Number of children: Not on file  . Years of education: Not on file  . Highest education level: Not on file  Social Needs  . Financial resource strain: Not on file  . Food insecurity - worry: Not on file  . Food insecurity - inability: Not on file  . Transportation needs - medical: Not on file  . Transportation needs - non-medical: Not on file  Occupational History  . Not on file    Tobacco Use  . Smoking status: Never Smoker  . Smokeless tobacco: Never Used  Substance and Sexual Activity  . Alcohol use: Yes    Alcohol/week: 0.0 oz    Comment: Rare  . Drug use: No  . Sexual activity: Not on file  Other Topics Concern  . Not on file  Social History Narrative   Lives in [place] with mother, son and boyfriend in a one story home. 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      Education: college degree and 1 year of grad school.      Diet - regular diet              Constitutional: Pt reports weight gain. Denies fever, malaise, fatigue, headache.  Cardiovascular: Denies chest pain, chest tightness, palpitations or swelling in the hands or feet.  Gastrointestinal: Denies abdominal pain, bloating, constipation, diarrhea or blood in the stool.   No other specific complaints in a complete review of systems (except as listed in HPI above).  Objective:   Physical Exam  BP 122/80   Pulse 88   Temp 98.2 F (36.8 C) (Oral)   Wt 217 lb (98.4 kg)   LMP 04/09/2012   BMI 33.99 kg/m  Wt  Readings from Last 3 Encounters:  01/16/17 217 lb (98.4 kg)  11/14/16 215 lb (97.5 kg)  09/30/16 215 lb (97.5 kg)    General: Appears her stated age, well developed, well nourished in NAD. Cardiovascular: Normal rate and rhythm. S1,S2 noted.  No murmur, rubs or gallops noted.  Pulmonary/Chest: Normal effort and positive vesicular breath sounds. No respiratory distress. No wheezes, rales or ronchi noted.   BMET    Component Value Date/Time   NA 143 06/18/2016 1116   NA 139 02/10/2014 0516   K 4.7 06/18/2016 1116   K 3.8 02/10/2014 0516   CL 106 06/18/2016 1116   CL 103 02/10/2014 0516   CO2 29 06/18/2016 1116   CO2 30 02/10/2014 0516   GLUCOSE 87 06/18/2016 1116   GLUCOSE 76 02/10/2014 0516   BUN 13 06/18/2016 1116   BUN 5 (L) 02/10/2014 0516   CREATININE 1.03 06/18/2016 1116   CREATININE 0.75 02/10/2014 0516   CALCIUM 9.8 06/18/2016 1116   CALCIUM 7.9 (L) 02/10/2014 0516    GFRNONAA >60 02/10/2014 0516   GFRNONAA >60 10/11/2012 1547   GFRAA >60 02/10/2014 0516   GFRAA >60 10/11/2012 1547    Lipid Panel     Component Value Date/Time   CHOL 167 06/18/2016 1116   TRIG 42.0 06/18/2016 1116   HDL 46.20 06/18/2016 1116   CHOLHDL 4 06/18/2016 1116   VLDL 8.4 06/18/2016 1116   LDLCALC 112 (H) 06/18/2016 1116    CBC    Component Value Date/Time   WBC 7.8 06/18/2016 1116   RBC 4.84 06/18/2016 1116   HGB 13.1 06/18/2016 1116   HGB 8.4 (L) 02/10/2014 1750   HCT 40.8 06/18/2016 1116   HCT 25.0 (L) 02/10/2014 0516   PLT 259.0 06/18/2016 1116   PLT 125 (L) 02/10/2014 0516   MCV 84.3 06/18/2016 1116   MCV 87 02/10/2014 0516   MCH 28.4 02/10/2014 0516   MCHC 32.2 06/18/2016 1116   RDW 12.9 06/18/2016 1116   RDW 14.4 02/10/2014 0516   LYMPHSABS 1.6 02/10/2014 0516   MONOABS 0.9 02/10/2014 0516   EOSABS 0.2 02/10/2014 0516   BASOSABS 0.0 02/10/2014 0516    Hgb A1C Lab Results  Component Value Date   HGBA1C 5.4 06/18/2016            Assessment & Plan:   Abnormal Weight Gain:  Discussed the importance of a high protein, low carb diet and regular exercise RX for Phentermine provided today  RTC in 1 month for weight check/med refill Webb Silversmith, NP

## 2017-01-16 NOTE — Addendum Note (Signed)
Addended by: Lurlean Nanny on: 01/16/2017 03:59 PM   Modules accepted: Orders

## 2017-02-10 ENCOUNTER — Encounter: Payer: Self-pay | Admitting: Neurology

## 2017-02-12 MED ORDER — PREDNISONE 10 MG PO TABS: ORAL_TABLET | ORAL | 0 refills | Status: DC

## 2017-02-21 ENCOUNTER — Ambulatory Visit: Payer: BLUE CROSS/BLUE SHIELD | Admitting: Family Medicine

## 2017-04-03 IMAGING — DX DG LUMBAR SPINE COMPLETE 4+V
5 series · 5 of 5 positions shown · non-contrast
Comparison: None in PACs

CLINICAL DATA: One week of low back pain with increasing symptoms;
left-sided sciatic symptoms; no mention of injury.

EXAM:
LUMBAR SPINE - COMPLETE 4+ VIEW

[l-spine ap]
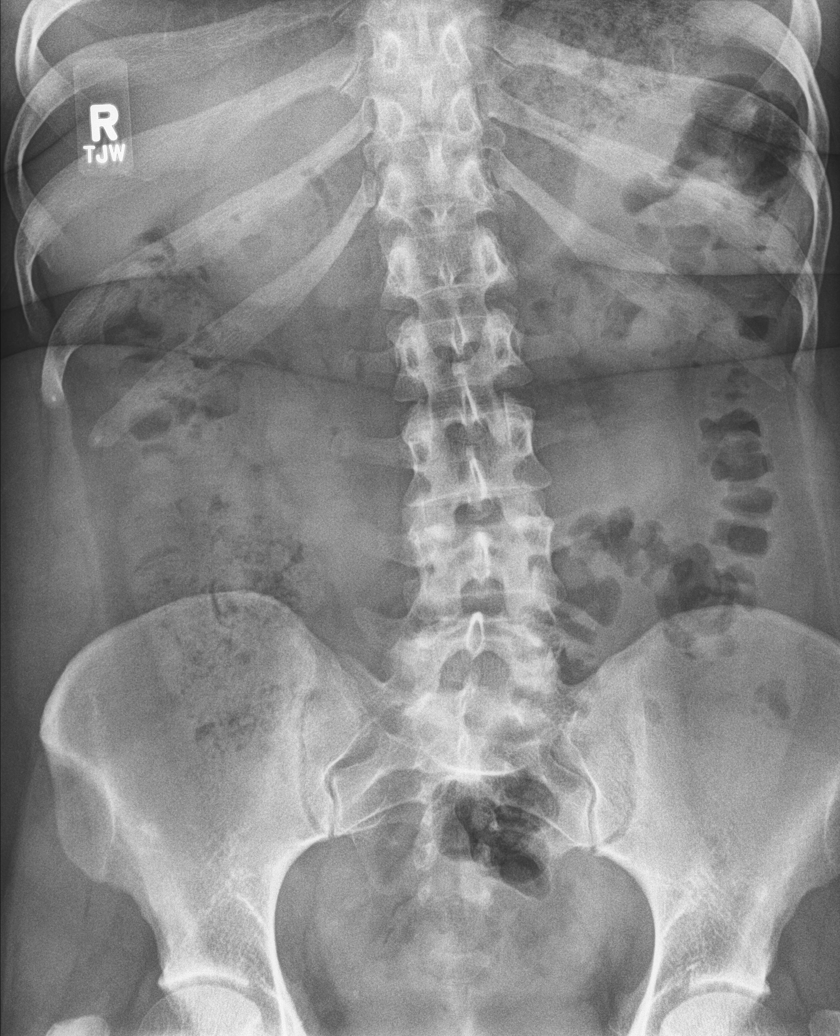

[l-spine obl (1 of 2)]
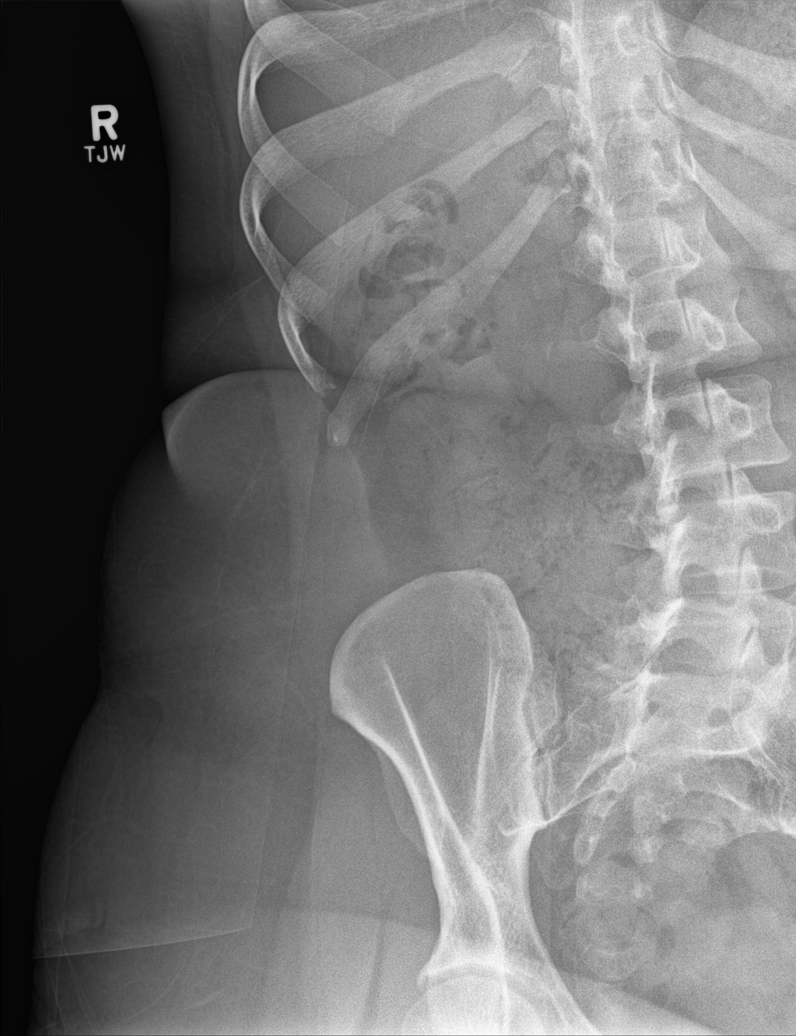

[l-spine obl (2 of 2)]
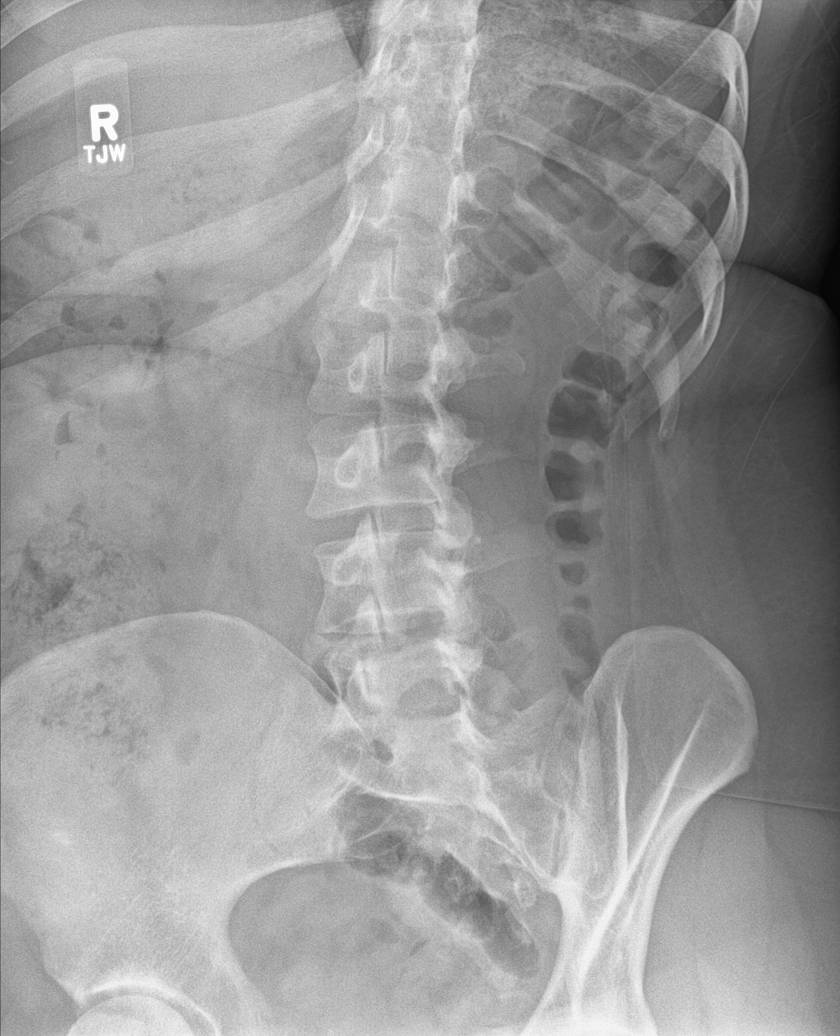

[l-spine lat]
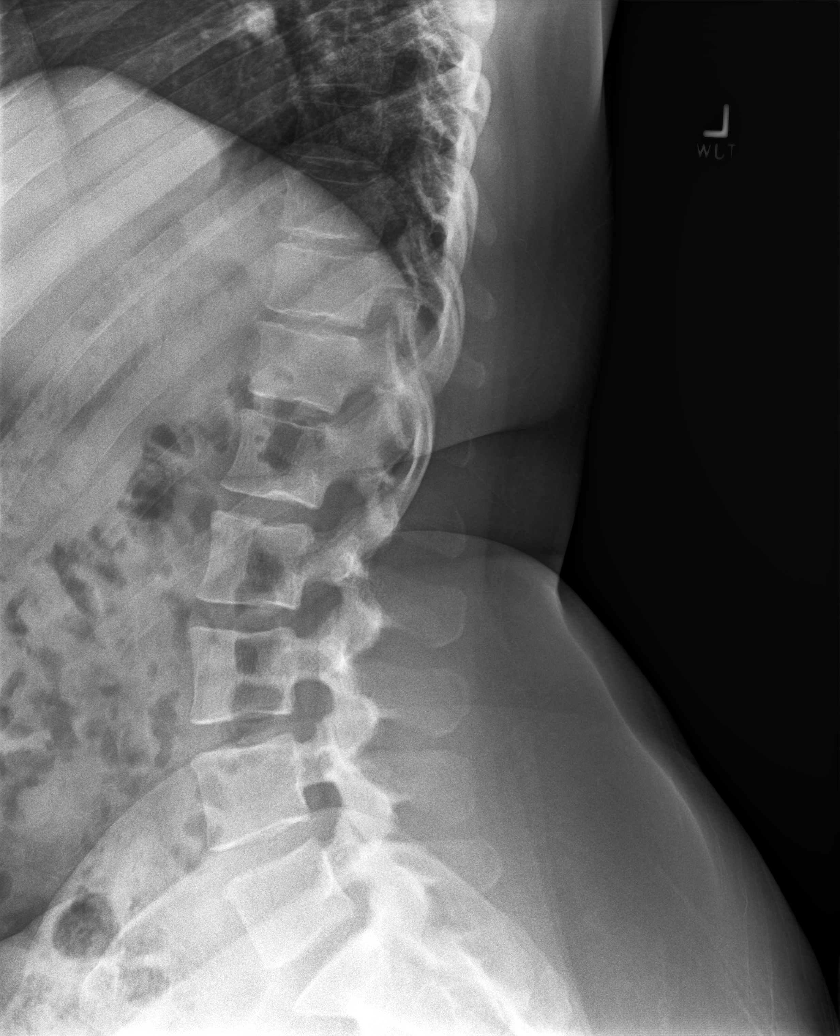

[l-spine l5/s1]
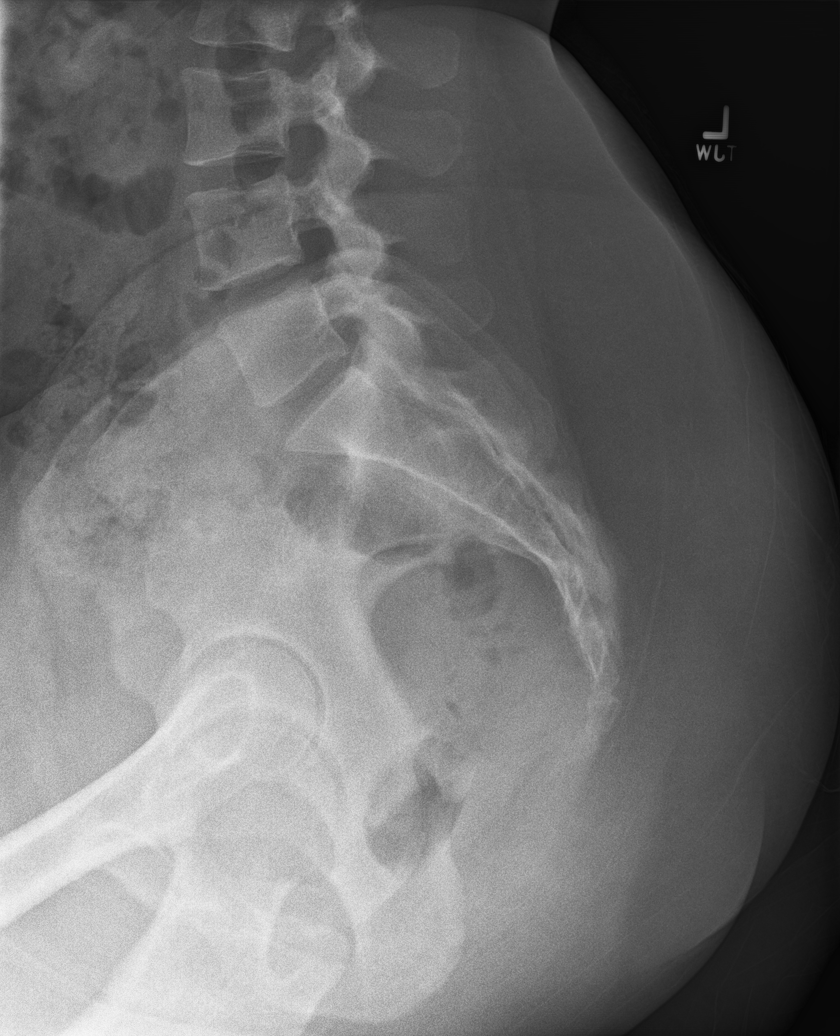

[5 of 5 positions shown; findings below may reference images not displayed]

FINDINGS: There is curvature centered at L4-5 with the convexity towards the
left the vertebral bodies are preserved in height. The pedicles and
transverse processes appear intact. The vertebral bodies are
preserved in height. The disc space heights are well maintained.
There is mild facet joint hypertrophy at L4-5 and at L5-S1. The
observed portions of the sacrum are normal.
IMPRESSION: There is no acute bony abnormality of the lumbar spine nor
significant disc space narrowing. Mild curvature convex toward the
left centered at L[DATE] be related to muscle spasm.

## 2017-04-13 ENCOUNTER — Encounter: Payer: Self-pay | Admitting: Internal Medicine

## 2017-04-15 ENCOUNTER — Ambulatory Visit: Payer: BLUE CROSS/BLUE SHIELD | Admitting: Internal Medicine

## 2017-04-15 ENCOUNTER — Encounter: Payer: Self-pay | Admitting: Internal Medicine

## 2017-04-15 VITALS — BP 118/76 | HR 80 | Temp 98.3°F | Wt 199.0 lb

## 2017-04-15 DIAGNOSIS — B9689 Other specified bacterial agents as the cause of diseases classified elsewhere: Secondary | ICD-10-CM

## 2017-04-15 DIAGNOSIS — N898 Other specified noninflammatory disorders of vagina: Secondary | ICD-10-CM

## 2017-04-15 DIAGNOSIS — B373 Candidiasis of vulva and vagina: Secondary | ICD-10-CM | POA: Diagnosis not present

## 2017-04-15 DIAGNOSIS — B3731 Acute candidiasis of vulva and vagina: Secondary | ICD-10-CM

## 2017-04-15 DIAGNOSIS — N760 Acute vaginitis: Secondary | ICD-10-CM | POA: Diagnosis not present

## 2017-04-15 LAB — POC URINALSYSI DIPSTICK (AUTOMATED)
Bilirubin, UA: NEGATIVE
Blood, UA: NEGATIVE
GLUCOSE UA: NEGATIVE
Nitrite, UA: NEGATIVE
PH UA: 8 (ref 5.0–8.0)
Spec Grav, UA: 1.015 (ref 1.010–1.025)
UROBILINOGEN UA: 0.2 U/dL

## 2017-04-15 MED ORDER — FLUCONAZOLE 150 MG PO TABS: 150.0000 mg | ORAL_TABLET | Freq: Once | ORAL | 0 refills | Status: DC

## 2017-04-15 MED ORDER — FLUCONAZOLE 150 MG PO TABS: 150.0000 mg | ORAL_TABLET | Freq: Once | ORAL | 0 refills | Status: AC

## 2017-04-15 MED ORDER — METRONIDAZOLE 0.75 % VA GEL: 1.0000 | Freq: Two times a day (BID) | VAGINAL | 0 refills | Status: DC

## 2017-04-15 NOTE — Progress Notes (Signed)
Subjective:    Patient ID: Alexis Suarez, female    DOB: 20-Dec-1975, 42 y.o.   MRN: [id number]  HPI  Pt presents to the clinic today with c/o vaginal itching. This started yesterday. She denies vaginal discharge, odor, abdominal pain, urinary frequency, urgency, dysuria or abnormal bleeding. She has not taken anything OTC for her symptoms.   Review of Systems  Past Medical History:  Diagnosis Date  . Asthma    ED eval in the past  . Sciatica     Current Outpatient Medications  Medication Sig Dispense Refill  . cyclobenzaprine (FLEXERIL) 5 MG tablet Take 1 tablet (5 mg total) by mouth at bedtime as needed for muscle spasms. 20 tablet 1  . fluconazole (DIFLUCAN) 150 MG tablet Take 1 tablet (150 mg total) by mouth once for 1 dose. 1 tablet 0  . metroNIDAZOLE (METROGEL VAGINAL) 0.75 % vaginal gel Place 1 Applicatorful vaginally 2 (two) times daily. 70 g 0  . naproxen (NAPROSYN) 500 MG tablet Take 1 tablet (500 mg total) by mouth 2 (two) times daily with a meal. 30 tablet 0  . phentermine 37.5 MG capsule Take 1 capsule (37.5 mg total) every morning by mouth. (Patient not taking: Reported on 04/15/2017) 30 capsule 2   No current facility-administered medications for this visit.     No Known Allergies  Family History  Problem Relation Age of Onset  . Thyroid disease Mother   . Hypertension Sister   . Miscarriages / Stillbirths Sister   . Stroke Sister   . Diabetes Father   . Prostate cancer Paternal Grandfather   . Cancer Paternal Grandfather        Prostate    Social History   Socioeconomic History  . Marital status: Single    Spouse name: Not on file  . Number of children: Not on file  . Years of education: Not on file  . Highest education level: Not on file  Social Needs  . Financial resource strain: Not on file  . Food insecurity - worry: Not on file  . Food insecurity - inability: Not on file  . Transportation needs - medical: Not on file  . Transportation  needs - non-medical: Not on file  Occupational History  . Not on file  Tobacco Use  . Smoking status: Never Smoker  . Smokeless tobacco: Never Used  Substance and Sexual Activity  . Alcohol use: Yes    Alcohol/week: 0.0 oz    Comment: Rare  . Drug use: No  . Sexual activity: Not on file  Other Topics Concern  . Not on file  Social History Narrative   Lives in [place] with mother, son and boyfriend in a one story home. 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      Education: college degree and 1 year of grad school.      Diet - regular diet              Constitutional: Denies fever, malaise, fatigue, headache or abrupt weight changes.  Gastrointestinal: Denies abdominal pain, bloating, constipation, diarrhea or blood in the stool.  GU: pt reports vaginal itching. Denies urgency, frequency, pain with urination, burning sensation, blood in urine, odor or discharge. M No other specific complaints in a complete review of systems (except as listed in HPI above).     Objective:   Physical Exam  BP 118/76   Pulse 80   Temp 98.3 F (36.8 C) (Oral)   Wt 199 lb (90.3 kg)  LMP 04/09/2012   BMI 31.17 kg/m   Wt Readings from Last 3 Encounters:  01/16/17 217 lb (98.4 kg)  11/14/16 215 lb (97.5 kg)  09/30/16 215 lb (97.5 kg)    General: Appears her stated age, well developed, well nourished in NAD. Abdomen: Soft and nontender.  Pelvic: Self swabbed.  BMET    Component Value Date/Time   NA 143 06/18/2016 1116   NA 139 02/10/2014 0516   K 4.7 06/18/2016 1116   K 3.8 02/10/2014 0516   CL 106 06/18/2016 1116   CL 103 02/10/2014 0516   CO2 29 06/18/2016 1116   CO2 30 02/10/2014 0516   GLUCOSE 87 06/18/2016 1116   GLUCOSE 76 02/10/2014 0516   BUN 13 06/18/2016 1116   BUN 5 (L) 02/10/2014 0516   CREATININE 1.03 06/18/2016 1116   CREATININE 0.75 02/10/2014 0516   CALCIUM 9.8 06/18/2016 1116   CALCIUM 7.9 (L) 02/10/2014 0516   GFRNONAA >60 02/10/2014 0516   GFRNONAA >60  10/11/2012 1547   GFRAA >60 02/10/2014 0516   GFRAA >60 10/11/2012 1547    Lipid Panel     Component Value Date/Time   CHOL 167 06/18/2016 1116   TRIG 42.0 06/18/2016 1116   HDL 46.20 06/18/2016 1116   CHOLHDL 4 06/18/2016 1116   VLDL 8.4 06/18/2016 1116   LDLCALC 112 (H) 06/18/2016 1116    CBC    Component Value Date/Time   WBC 7.8 06/18/2016 1116   RBC 4.84 06/18/2016 1116   HGB 13.1 06/18/2016 1116   HGB 8.4 (L) 02/10/2014 1750   HCT 40.8 06/18/2016 1116   HCT 25.0 (L) 02/10/2014 0516   PLT 259.0 06/18/2016 1116   PLT 125 (L) 02/10/2014 0516   MCV 84.3 06/18/2016 1116   MCV 87 02/10/2014 0516   MCH 28.4 02/10/2014 0516   MCHC 32.2 06/18/2016 1116   RDW 12.9 06/18/2016 1116   RDW 14.4 02/10/2014 0516   LYMPHSABS 1.6 02/10/2014 0516   MONOABS 0.9 02/10/2014 0516   EOSABS 0.2 02/10/2014 0516   BASOSABS 0.0 02/10/2014 0516    Hgb A1C Lab Results  Component Value Date   HGBA1C 5.4 06/18/2016            Assessment & Plan:   Vaginal Itching, Vaginal Candidiasis, Bacterial Vaginosis:  Wet prep: + yeast, + clue cells eRx for Diflucan 150 mg PO x 1 eRx for Metrogel BID x 5 days  Return precautions discussed Webb Silversmith, NP

## 2017-04-15 NOTE — Patient Instructions (Signed)

## 2017-04-15 NOTE — Addendum Note (Signed)
Addended by: Lurlean Nanny on: 04/15/2017 01:32 PM   Modules accepted: Orders

## 2017-06-18 ENCOUNTER — Other Ambulatory Visit: Payer: Self-pay | Admitting: Internal Medicine

## 2017-06-18 DIAGNOSIS — Z139 Encounter for screening, unspecified: Secondary | ICD-10-CM

## 2017-07-10 ENCOUNTER — Encounter: Payer: Self-pay | Admitting: Internal Medicine

## 2017-07-14 ENCOUNTER — Encounter: Payer: Self-pay | Admitting: Internal Medicine

## 2017-07-17 ENCOUNTER — Other Ambulatory Visit: Payer: Self-pay | Admitting: Internal Medicine

## 2017-07-18 ENCOUNTER — Ambulatory Visit: Payer: BLUE CROSS/BLUE SHIELD | Admitting: Neurology

## 2017-07-31 ENCOUNTER — Ambulatory Visit: Payer: BLUE CROSS/BLUE SHIELD | Admitting: Internal Medicine

## 2017-07-31 DIAGNOSIS — Z0289 Encounter for other administrative examinations: Secondary | ICD-10-CM

## 2017-08-04 ENCOUNTER — Ambulatory Visit
Admission: RE | Admit: 2017-08-04 | Discharge: 2017-08-04 | Disposition: A | Discharge status: Home / Self Care | Payer: BLUE CROSS/BLUE SHIELD | Source: Ambulatory Visit | Attending: Internal Medicine | Admitting: Internal Medicine

## 2017-08-04 DIAGNOSIS — Z1231 Encounter for screening mammogram for malignant neoplasm of breast: Secondary | ICD-10-CM | POA: Diagnosis not present

## 2017-08-04 DIAGNOSIS — Z139 Encounter for screening, unspecified: Secondary | ICD-10-CM

## 2017-08-05 ENCOUNTER — Ambulatory Visit: Payer: BLUE CROSS/BLUE SHIELD | Admitting: Internal Medicine

## 2017-08-05 VITALS — BP 122/82 | HR 80 | Temp 98.2°F | Wt 209.0 lb

## 2017-08-05 DIAGNOSIS — R635 Abnormal weight gain: Secondary | ICD-10-CM

## 2017-08-05 MED ORDER — PHENTERMINE HCL 37.5 MG PO CAPS: 37.5000 mg | ORAL_CAPSULE | ORAL | 0 refills | Status: DC

## 2017-08-05 NOTE — Progress Notes (Signed)
Subjective:    Patient ID: Alexis Suarez, female    DOB: Jul 17, 1975, 42 y.o.   MRN: [id number]  HPI  Pt presents to the clinic today wanting to be restarted on her Phentermine. Her weight today is 209 lbs with a BMI of 32.73.  Breakfast: 2 eggs scrambled with cheese, 2-3 bacon slices Lunch: she often skips lunch Dinner: chicken, some veggies, no carbs Snacks: none Exercise: walks  2 miles, 2-3 days per week  Review of Systems      Past Medical History:  Diagnosis Date  . Asthma    ED eval in the past  . Sciatica     Current Outpatient Medications  Medication Sig Dispense Refill  . phentermine 37.5 MG capsule Take 1 capsule (37.5 mg total) by mouth every morning. 30 capsule 0   No current facility-administered medications for this visit.     No Known Allergies  Family History  Problem Relation Age of Onset  . Thyroid disease Mother   . Hypertension Sister   . Miscarriages / Stillbirths Sister   . Stroke Sister   . Diabetes Father   . Prostate cancer Paternal Grandfather   . Cancer Paternal Grandfather        Prostate    Social History   Socioeconomic History  . Marital status: Single    Spouse name: Not on file  . Number of children: Not on file  . Years of education: Not on file  . Highest education level: Not on file  Occupational History  . Not on file  Social Needs  . Financial resource strain: Not on file  . Food insecurity:    Worry: Not on file    Inability: Not on file  . Transportation needs:    Medical: Not on file    Non-medical: Not on file  Tobacco Use  . Smoking status: Never Smoker  . Smokeless tobacco: Never Used  Substance and Sexual Activity  . Alcohol use: Yes    Alcohol/week: 0.0 oz    Comment: Rare  . Drug use: No  . Sexual activity: Not on file  Lifestyle  . Physical activity:    Days per week: Not on file    Minutes per session: Not on file  . Stress: Not on file  Relationships  . Social connections:    Talks on  phone: Not on file    Gets together: Not on file    Attends religious service: Not on file    Active member of club or organization: Not on file    Attends meetings of clubs or organizations: Not on file    Relationship status: Not on file  . Intimate partner violence:    Fear of current or ex partner: Not on file    Emotionally abused: Not on file    Physically abused: Not on file    Forced sexual activity: Not on file  Other Topics Concern  . Not on file  Social History Narrative   Lives in [place] with mother, son and boyfriend in a one story home. 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      Education: college degree and 1 year of grad school.      Diet - regular diet              Constitutional: Denies fever, malaise, fatigue, headache or abrupt weight changes.  Respiratory: Denies difficulty breathing, shortness of breath, cough or sputum production.   Cardiovascular: Denies chest pain, chest tightness,  palpitations or swelling in the hands or feet.  Neurological: Denies dizziness, difficulty with memory, difficulty with speech or problems with balance and coordination.    No other specific complaints in a complete review of systems (except as listed in HPI above).  Objective:   Physical Exam   BP 122/82   Pulse 80   Temp 98.2 F (36.8 C) (Oral)   Wt 209 lb (94.8 kg)   LMP 04/09/2012   BMI 32.73 kg/m  Wt Readings from Last 3 Encounters:  08/05/17 209 lb (94.8 kg)  04/15/17 199 lb (90.3 kg)  01/16/17 217 lb (98.4 kg)    General: Appears her stated age, obese in NAD. Cardiovascular: Normal rate and rhythm. S1,S2 noted.  No murmur, rubs or gallops noted. No JVD or BLE edema. No carotid bruits noted. Pulmonary/Chest: Normal effort and positive vesicular breath sounds. No respiratory distress. No wheezes, rales or ronchi noted.  Neurological: Alert and oriented.   BMET    Component Value Date/Time   NA 143 06/18/2016 1116   NA 139 02/10/2014 0516   K 4.7  06/18/2016 1116   K 3.8 02/10/2014 0516   CL 106 06/18/2016 1116   CL 103 02/10/2014 0516   CO2 29 06/18/2016 1116   CO2 30 02/10/2014 0516   GLUCOSE 87 06/18/2016 1116   GLUCOSE 76 02/10/2014 0516   BUN 13 06/18/2016 1116   BUN 5 (L) 02/10/2014 0516   CREATININE 1.03 06/18/2016 1116   CREATININE 0.75 02/10/2014 0516   CALCIUM 9.8 06/18/2016 1116   CALCIUM 7.9 (L) 02/10/2014 0516   GFRNONAA >60 02/10/2014 0516   GFRNONAA >60 10/11/2012 1547   GFRAA >60 02/10/2014 0516   GFRAA >60 10/11/2012 1547    Lipid Panel     Component Value Date/Time   CHOL 167 06/18/2016 1116   TRIG 42.0 06/18/2016 1116   HDL 46.20 06/18/2016 1116   CHOLHDL 4 06/18/2016 1116   VLDL 8.4 06/18/2016 1116   LDLCALC 112 (H) 06/18/2016 1116    CBC    Component Value Date/Time   WBC 7.8 06/18/2016 1116   RBC 4.84 06/18/2016 1116   HGB 13.1 06/18/2016 1116   HGB 8.4 (L) 02/10/2014 1750   HCT 40.8 06/18/2016 1116   HCT 25.0 (L) 02/10/2014 0516   PLT 259.0 06/18/2016 1116   PLT 125 (L) 02/10/2014 0516   MCV 84.3 06/18/2016 1116   MCV 87 02/10/2014 0516   MCH 28.4 02/10/2014 0516   MCHC 32.2 06/18/2016 1116   RDW 12.9 06/18/2016 1116   RDW 14.4 02/10/2014 0516   LYMPHSABS 1.6 02/10/2014 0516   MONOABS 0.9 02/10/2014 0516   EOSABS 0.2 02/10/2014 0516   BASOSABS 0.0 02/10/2014 0516    Hgb A1C Lab Results  Component Value Date   HGBA1C 5.4 06/18/2016           Assessment & Plan:   Abnormal Weight Gain:  eRx for Phentermine 37.5 mg provided today Advised her to eat 6 small meals per day Continue to avoid carbs Try to get at least 150 minutes of exercise weekly  RTC in 1 month for weight check/med refill Webb Silversmith, NP

## 2017-08-13 ENCOUNTER — Encounter: Payer: Self-pay | Admitting: Internal Medicine

## 2017-08-13 NOTE — Patient Instructions (Signed)

## 2017-09-09 ENCOUNTER — Encounter: Payer: BLUE CROSS/BLUE SHIELD | Admitting: Internal Medicine

## 2017-09-24 ENCOUNTER — Ambulatory Visit: Payer: BLUE CROSS/BLUE SHIELD | Admitting: Internal Medicine

## 2017-09-24 ENCOUNTER — Encounter: Payer: Self-pay | Admitting: Internal Medicine

## 2017-09-24 VITALS — BP 124/80 | HR 88 | Temp 98.1°F | Ht 67.0 in | Wt 213.0 lb

## 2017-09-24 DIAGNOSIS — Z113 Encounter for screening for infections with a predominantly sexual mode of transmission: Secondary | ICD-10-CM | POA: Diagnosis not present

## 2017-09-24 DIAGNOSIS — Z0000 Encounter for general adult medical examination without abnormal findings: Secondary | ICD-10-CM | POA: Diagnosis not present

## 2017-09-24 NOTE — Patient Instructions (Signed)

## 2017-09-24 NOTE — Addendum Note (Signed)
Addended by: Lurlean Nanny on: 09/24/2017 05:12 PM   Modules accepted: Orders

## 2017-09-24 NOTE — Progress Notes (Signed)
Subjective:    Patient ID: Alexis Suarez, female    DOB: [birth year], 42 y.o.   MRN: [id number]  HPI  Pt presents to the clinic today for her annual exam.  Flu: 01/2017 Tetanus: 04/2010 Pap Smear: 01/2014, partial hysterectomy Mammogram: 08/2017 Colon Screening: 07/2014 Vision Screening: annually Dentist: annually  Diet: She does eat meat. She consumes fruits and veggies daily. She tries to avoid fried foods. She drinks mostly Ginger Ale, water. Exercise: Walking  Review of Systems      Past Medical History:  Diagnosis Date  . Asthma    ED eval in the past  . Sciatica     Current Outpatient Medications  Medication Sig Dispense Refill  . phentermine 37.5 MG capsule Take 1 capsule (37.5 mg total) by mouth every morning. 30 capsule 0   No current facility-administered medications for this visit.     No Known Allergies  Family History  Problem Relation Age of Onset  . Thyroid disease Mother   . Hypertension Sister   . Miscarriages / Stillbirths Sister   . Stroke Sister   . Diabetes Father   . Prostate cancer Paternal Grandfather   . Cancer Paternal Grandfather        Prostate    Social History   Socioeconomic History  . Marital status: Single    Spouse name: Not on file  . Number of children: Not on file  . Years of education: Not on file  . Highest education level: Not on file  Occupational History  . Not on file  Social Needs  . Financial resource strain: Not on file  . Food insecurity:    Worry: Not on file    Inability: Not on file  . Transportation needs:    Medical: Not on file    Non-medical: Not on file  Tobacco Use  . Smoking status: Never Smoker  . Smokeless tobacco: Never Used  Substance and Sexual Activity  . Alcohol use: Yes    Alcohol/week: 0.0 oz    Comment: Rare  . Drug use: No  . Sexual activity: Not on file  Lifestyle  . Physical activity:    Days per week: Not on file    Minutes per session: Not on file  . Stress: Not on  file  Relationships  . Social connections:    Talks on phone: Not on file    Gets together: Not on file    Attends religious service: Not on file    Active member of club or organization: Not on file    Attends meetings of clubs or organizations: Not on file    Relationship status: Not on file  . Intimate partner violence:    Fear of current or ex partner: Not on file    Emotionally abused: Not on file    Physically abused: Not on file    Forced sexual activity: Not on file  Other Topics Concern  . Not on file  Social History Narrative   Lives in [place] with mother, son and boyfriend in a one story home. 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      Education: college degree and 1 year of grad school.      Diet - regular diet              Constitutional: Pt reports weight gain. Denies fever, malaise, fatigue, headache.  HEENT: Denies eye pain, eye redness, ear pain, ringing in the ears, wax buildup, runny nose, nasal congestion, bloody  nose, or sore throat. Respiratory: Denies difficulty breathing, shortness of breath, cough or sputum production.   Cardiovascular: Denies chest pain, chest tightness, palpitations or swelling in the hands or feet.  Gastrointestinal: Pt reports constipation. Denies abdominal pain, bloating, diarrhea or blood in the stool.  GU: Denies urgency, frequency, pain with urination, burning sensation, blood in urine, odor or discharge. Musculoskeletal: Denies decrease in range of motion, difficulty with gait, muscle pain or joint pain and swelling.  Skin: Denies redness, rashes, lesions or ulcercations.  Neurological: Denies dizziness, difficulty with memory, difficulty with speech or problems with balance and coordination.  Psych: Denies anxiety, depression, SI/HI.  No other specific complaints in a complete review of systems (except as listed in HPI above).  Objective:   Physical Exam  BP 124/80   Pulse 88   Temp 98.1 F (36.7 C) (Oral)   Ht 5\' 7"  (1.702  m)   Wt 213 lb (96.6 kg)   LMP 04/09/2012   BMI 33.36 kg/m  Wt Readings from Last 3 Encounters:  09/24/17 213 lb (96.6 kg)  08/05/17 209 lb (94.8 kg)  04/15/17 199 lb (90.3 kg)    General: Appears her stated age, obese in NAD. Skin: Warm, dry and intact.  HEENT: Head: normal shape and size; Eyes: sclera white, no icterus, conjunctiva pink, PERRLA and EOMs intact; Ears: Tm's gray and intact, normal light reflex; Throat/Mouth: Teeth present, mucosa pink and moist, no exudate, lesions or ulcerations noted.  Neck:  Neck supple, trachea midline. No masses, lumps or thyromegaly present.  Cardiovascular: Normal rate and rhythm. S1,S2 noted.  No murmur, rubs or gallops noted. No JVD or BLE edema.  Pulmonary/Chest: Normal effort and positive vesicular breath sounds. No respiratory distress. No wheezes, rales or ronchi noted.  Abdomen: Soft and nontender. Normal bowel sounds. No distention or masses noted. Liver, spleen and kidneys non palpable. Musculoskeletal: Strength 5/5 BUE/BLE. No difficulty with gait.  Neurological: Alert and oriented. Cranial nerves II-XII grossly intact. Coordination normal.  Psychiatric: Mood and affect normal. Behavior is normal. Judgment and thought content normal.    BMET    Component Value Date/Time   NA 143 06/18/2016 1116   NA 139 02/10/2014 0516   K 4.7 06/18/2016 1116   K 3.8 02/10/2014 0516   CL 106 06/18/2016 1116   CL 103 02/10/2014 0516   CO2 29 06/18/2016 1116   CO2 30 02/10/2014 0516   GLUCOSE 87 06/18/2016 1116   GLUCOSE 76 02/10/2014 0516   BUN 13 06/18/2016 1116   BUN 5 (L) 02/10/2014 0516   CREATININE 1.03 06/18/2016 1116   CREATININE 0.75 02/10/2014 0516   CALCIUM 9.8 06/18/2016 1116   CALCIUM 7.9 (L) 02/10/2014 0516   GFRNONAA >60 02/10/2014 0516   GFRNONAA >60 10/11/2012 1547   GFRAA >60 02/10/2014 0516   GFRAA >60 10/11/2012 1547    Lipid Panel     Component Value Date/Time   CHOL 167 06/18/2016 1116   TRIG 42.0 06/18/2016  1116   HDL 46.20 06/18/2016 1116   CHOLHDL 4 06/18/2016 1116   VLDL 8.4 06/18/2016 1116   LDLCALC 112 (H) 06/18/2016 1116    CBC    Component Value Date/Time   WBC 7.8 06/18/2016 1116   RBC 4.84 06/18/2016 1116   HGB 13.1 06/18/2016 1116   HGB 8.4 (L) 02/10/2014 1750   HCT 40.8 06/18/2016 1116   HCT 25.0 (L) 02/10/2014 0516   PLT 259.0 06/18/2016 1116   PLT 125 (L) 02/10/2014 3382  MCV 84.3 06/18/2016 1116   MCV 87 02/10/2014 0516   MCH 28.4 02/10/2014 0516   MCHC 32.2 06/18/2016 1116   RDW 12.9 06/18/2016 1116   RDW 14.4 02/10/2014 0516   LYMPHSABS 1.6 02/10/2014 0516   MONOABS 0.9 02/10/2014 0516   EOSABS 0.2 02/10/2014 0516   BASOSABS 0.0 02/10/2014 0516    Hgb A1C Lab Results  Component Value Date   HGBA1C 5.4 06/18/2016            Assessment & Plan:   Preventative Health Maintenance:  Encouraged her to get a flu shot in the fall Tetanus UTD She no longer needs pap smear but do for pelvic exam in 2020 Mammogram UTD Colon screening UTD Encouraged her to consume a balanced diet and exercise regimen Advised her to see an eye doctor and dentist annually Will check CBC, CMET, Lipid, Vit D today  Screening for STD:  Urine gonorrhea, chlamydia and trich Will check HIV and RPR  RTC in 1 year, sooner if needed Webb Silversmith, NP

## 2017-09-25 LAB — CBC
HCT: 37.4 % (ref 36.0–46.0)
HEMOGLOBIN: 12.5 g/dL (ref 12.0–15.0)
MCHC: 33.4 g/dL (ref 30.0–36.0)
MCV: 84.2 fl (ref 78.0–100.0)
PLATELETS: 240 10*3/uL (ref 150.0–400.0)
RBC: 4.45 Mil/uL (ref 3.87–5.11)
RDW: 12.7 % (ref 11.5–15.5)
WBC: 10.5 10*3/uL (ref 4.0–10.5)

## 2017-09-25 LAB — COMPREHENSIVE METABOLIC PANEL
ALT: 10 U/L (ref 0–35)
AST: 11 U/L (ref 0–37)
Albumin: 3.9 g/dL (ref 3.5–5.2)
Alkaline Phosphatase: 93 U/L (ref 39–117)
BUN: 15 mg/dL (ref 6–23)
CALCIUM: 9.2 mg/dL (ref 8.4–10.5)
CHLORIDE: 104 meq/L (ref 96–112)
CO2: 29 meq/L (ref 19–32)
Creatinine, Ser: 0.88 mg/dL (ref 0.40–1.20)
GFR: 90.96 mL/min (ref >60.00)
Glucose, Bld: 86 mg/dL (ref 70–99)
Potassium: 4 mEq/L (ref 3.5–5.1)
Sodium: 141 mEq/L (ref 135–145)
Total Bilirubin: 0.4 mg/dL (ref 0.2–1.2)
Total Protein: 7.2 g/dL (ref 6.0–8.3)

## 2017-09-25 LAB — LIPID PANEL
CHOL/HDL RATIO: 3
CHOLESTEROL: 156 mg/dL (ref 0–200)
HDL: 51.1 mg/dL (ref >39.00)
LDL CALC: 95 mg/dL (ref 0–99)
NonHDL: 104.66
TRIGLYCERIDES: 50 mg/dL (ref 0.0–149.0)
VLDL: 10 mg/dL (ref 0.0–40.0)

## 2017-09-25 LAB — C. TRACHOMATIS/N. GONORRHOEAE RNA
C. trachomatis RNA, TMA: NOT DETECTED
N. gonorrhoeae RNA, TMA: NOT DETECTED

## 2017-09-25 LAB — TRICHOMONAS VAGINALIS, PROBE AMP: Trichomonas vaginalis RNA: NOT DETECTED

## 2017-09-25 LAB — VITAMIN D 25 HYDROXY (VIT D DEFICIENCY, FRACTURES): VITD: 17.6 ng/mL — ABNORMAL LOW (ref 30.00–100.00)

## 2017-09-26 ENCOUNTER — Other Ambulatory Visit: Payer: Self-pay | Admitting: Internal Medicine

## 2017-09-26 DIAGNOSIS — E559 Vitamin D deficiency, unspecified: Secondary | ICD-10-CM

## 2017-09-26 LAB — HIV ANTIBODY (ROUTINE TESTING W REFLEX): HIV 1&2 Ab, 4th Generation: NONREACTIVE

## 2017-09-26 LAB — RPR: RPR: NONREACTIVE

## 2017-09-26 MED ORDER — VITAMIN D (ERGOCALCIFEROL) 1.25 MG (50000 UNIT) PO CAPS: 50000.0000 [IU] | ORAL_CAPSULE | ORAL | 0 refills | Status: DC

## 2017-09-29 ENCOUNTER — Encounter: Payer: Self-pay | Admitting: Internal Medicine

## 2017-10-06 ENCOUNTER — Encounter: Payer: Self-pay | Admitting: Internal Medicine

## 2017-10-06 ENCOUNTER — Ambulatory Visit: Payer: BLUE CROSS/BLUE SHIELD | Admitting: Internal Medicine

## 2017-10-06 VITALS — BP 124/80 | HR 80 | Temp 97.9°F | Wt 212.0 lb

## 2017-10-06 DIAGNOSIS — J301 Allergic rhinitis due to pollen: Secondary | ICD-10-CM | POA: Diagnosis not present

## 2017-10-06 MED ORDER — PREDNISONE 10 MG PO TABS: ORAL_TABLET | ORAL | 0 refills | Status: DC

## 2017-10-06 NOTE — Patient Instructions (Signed)

## 2017-10-06 NOTE — Progress Notes (Signed)
HPI  Pt presents to the clinic today with c/o runny nose, nasal congestion, itchy ears and sore throat. She reports this started 2-3 days ago. She is blowing yellow mucous out of her nose. She denies ear pain or decreased hearing. She denies difficulty swallowing. She denies fever, chills or body aches. She has a history of asthma but not allergies. She has tried Mucinex with minimal relief. She has not had sick contacts that she is aware of.  Review of Systems      Past Medical History:  Diagnosis Date  . Asthma    ED eval in the past  . Sciatica     Family History  Problem Relation Age of Onset  . Thyroid disease Mother   . Hypertension Sister   . Miscarriages / Stillbirths Sister   . Stroke Sister   . Diabetes Father   . Prostate cancer Paternal Grandfather   . Cancer Paternal Grandfather        Prostate    Social History   Socioeconomic History  . Marital status: Single    Spouse name: Not on file  . Number of children: Not on file  . Years of education: Not on file  . Highest education level: Not on file  Occupational History  . Not on file  Social Needs  . Financial resource strain: Not on file  . Food insecurity:    Worry: Not on file    Inability: Not on file  . Transportation needs:    Medical: Not on file    Non-medical: Not on file  Tobacco Use  . Smoking status: Never Smoker  . Smokeless tobacco: Never Used  Substance and Sexual Activity  . Alcohol use: Yes    Alcohol/week: 0.0 oz    Comment: Rare  . Drug use: No  . Sexual activity: Not on file  Lifestyle  . Physical activity:    Days per week: Not on file    Minutes per session: Not on file  . Stress: Not on file  Relationships  . Social connections:    Talks on phone: Not on file    Gets together: Not on file    Attends religious service: Not on file    Active member of club or organization: Not on file    Attends meetings of clubs or organizations: Not on file    Relationship status:  Not on file  . Intimate partner violence:    Fear of current or ex partner: Not on file    Emotionally abused: Not on file    Physically abused: Not on file    Forced sexual activity: Not on file  Other Topics Concern  . Not on file  Social History Narrative   Lives in [place] with mother, son and boyfriend in a one story home. 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      Education: college degree and 1 year of grad school.      Diet - regular diet             No Known Allergies   Constitutional: Denies headache, fatigue, fever or abrupt weight changes.  HEENT:  Positive runny nose, nasal congestion, itching ears, sore throat. Denies eye redness, eye pain, pressure behind the eyes, facial pain, ear pain, ringing in the ears, wax buildup, or bloody nose. Respiratory: Denies cough, difficulty breathing or shortness of breath.  Cardiovascular: Denies chest pain, chest tightness, palpitations or swelling in the hands or feet.   No other  specific complaints in a complete review of systems (except as listed in HPI above).  Objective:   BP 124/80   Pulse 80   Temp 97.9 F (36.6 C) (Oral)   Wt 212 lb (96.2 kg)   LMP 04/09/2012   BMI 33.20 kg/m  Wt Readings from Last 3 Encounters:  10/06/17 212 lb (96.2 kg)  09/24/17 213 lb (96.6 kg)  08/05/17 209 lb (94.8 kg)     General: Appears her stated age, well developed, well nourished in NAD. HEENT: Head: normal shape and size, no sinus tenderness noted;  Ears: Tm's gray and intact, normal light reflex; Nose: mucosa boggy and moist, turbinates swollen; Throat/Mouth: + PND. Teeth present, mucosa erythematous and moist, no exudate noted, no lesions or ulcerations noted.  Neck: No cervical lymphadenopathy.  Cardiovascular: Normal rate and rhythm. S1,S2 noted.  No murmur, rubs or gallops noted.  Pulmonary/Chest: Normal effort and positive vesicular breath sounds. No respiratory distress. No wheezes, rales or ronchi noted.       Assessment &  Plan:   Allergic Rhinitis:  Get some rest and drink plenty of water Do salt water gargles for the sore throat eRx for Pred Taper x 6 days Start Zyrtec daily x 7-10 days  RTC as needed or if symptoms persist.   Webb Silversmith, NP

## 2017-12-24 ENCOUNTER — Encounter: Payer: Self-pay | Admitting: Internal Medicine

## 2018-01-26 ENCOUNTER — Encounter: Payer: Self-pay | Admitting: Internal Medicine

## 2018-01-28 ENCOUNTER — Ambulatory Visit: Payer: BLUE CROSS/BLUE SHIELD | Admitting: Internal Medicine

## 2018-01-28 ENCOUNTER — Encounter: Payer: Self-pay | Admitting: Internal Medicine

## 2018-01-28 VITALS — BP 122/78 | HR 60 | Temp 98.0°F | Wt 219.0 lb

## 2018-01-28 DIAGNOSIS — R635 Abnormal weight gain: Secondary | ICD-10-CM | POA: Diagnosis not present

## 2018-01-28 DIAGNOSIS — Z23 Encounter for immunization: Secondary | ICD-10-CM | POA: Diagnosis not present

## 2018-01-28 MED ORDER — PHENTERMINE HCL 37.5 MG PO CAPS: 37.5000 mg | ORAL_CAPSULE | ORAL | 0 refills | Status: DC

## 2018-01-28 NOTE — Patient Instructions (Signed)

## 2018-01-28 NOTE — Progress Notes (Signed)
Subjective:    Patient ID: Alexis Suarez, female    DOB: 12-30-1975, 42 y.o.   MRN: [id number]  HPI  Pt presents to the clinic today requesting a refill on her Phentermine. Her weight today is 219 lbs with a BMI of 34.30. She last took Phentermine in June. She reports she will take it for 1-2 months and then not need it for a few months. If her weight starts trending back up, she will restart it.  Breakfast: Cereal Lunch: Chicken Wings, Salad with [employer] Dinner: Birdie Hopes, Cabbage Snacks: none Exercise: none  Review of Systems      Past Medical History:  Diagnosis Date  . Asthma    ED eval in the past  . Sciatica     Current Outpatient Medications  Medication Sig Dispense Refill  . phentermine 37.5 MG capsule Take 1 capsule (37.5 mg total) by mouth every morning. (Patient not taking: Reported on 10/06/2017) 30 capsule 0  . polyethylene glycol (MIRALAX / GLYCOLAX) packet Take 17 g by mouth every other day.    . predniSONE (DELTASONE) 10 MG tablet Take 3 tabs on days 1-2, take 2 tabs on days 3-4, take 1 tab on days 5-6 12 tablet 0  . Vitamin D, Ergocalciferol, (DRISDOL) 50000 units CAPS capsule Take 1 capsule (50,000 Units total) by mouth every 7 (seven) days. 12 capsule 0   No current facility-administered medications for this visit.     No Known Allergies  Family History  Problem Relation Age of Onset  . Thyroid disease Mother   . Hypertension Sister   . Miscarriages / Stillbirths Sister   . Stroke Sister   . Diabetes Father   . Prostate cancer Paternal Grandfather   . Cancer Paternal Grandfather        Prostate    Social History   Socioeconomic History  . Marital status: Single    Spouse name: Not on file  . Number of children: Not on file  . Years of education: Not on file  . Highest education level: Not on file  Occupational History  . Not on file  Social Needs  . Financial resource strain: Not on file  . Food insecurity:   Worry: Not on file    Inability: Not on file  . Transportation needs:    Medical: Not on file    Non-medical: Not on file  Tobacco Use  . Smoking status: Never Smoker  . Smokeless tobacco: Never Used  Substance and Sexual Activity  . Alcohol use: Yes    Alcohol/week: 0.0 standard drinks    Comment: Rare  . Drug use: No  . Sexual activity: Not on file  Lifestyle  . Physical activity:    Days per week: Not on file    Minutes per session: Not on file  . Stress: Not on file  Relationships  . Social connections:    Talks on phone: Not on file    Gets together: Not on file    Attends religious service: Not on file    Active member of club or organization: Not on file    Attends meetings of clubs or organizations: Not on file    Relationship status: Not on file  . Intimate partner violence:    Fear of current or ex partner: Not on file    Emotionally abused: Not on file    Physically abused: Not on file    Forced sexual activity: Not on file  Other Topics  Concern  . Not on file  Social History Narrative   Lives in [place] with mother, son and boyfriend in a one story home. 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      Education: college degree and 1 year of grad school.      Diet - regular diet              Constitutional: Pt reports abnormal weight gain. Denies fever, malaise, fatigue, headache.  Respiratory: Denies difficulty breathing, shortness of breath, cough or sputum production.   Cardiovascular: Denies chest pain, chest tightness, palpitations or swelling in the hands or feet.  Neurological: Denies dizziness, difficulty with memory, difficulty with speech or problems with balance and coordination.    No other specific complaints in a complete review of systems (except as listed in HPI above).  Objective:   Physical Exam  Wt 219 lb (99.3 kg)   LMP 04/09/2012   BMI 34.30 kg/m  Wt Readings from Last 3 Encounters:  01/28/18 219 lb (99.3 kg)  10/06/17 212 lb (96.2  kg)  09/24/17 213 lb (96.6 kg)    General: Appears her stated age, obese, in NAD. Cardiovascular: Normal rate and rhythm. S1,S2 noted.  No murmur, rubs or gallops noted.  Pulmonary/Chest: Normal effort and positive vesicular breath sounds. No respiratory distress. No wheezes, rales or ronchi noted.  Neurological: Alert and oriented.    BMET    Component Value Date/Time   NA 141 09/24/2017 1539   NA 139 02/10/2014 0516   K 4.0 09/24/2017 1539   K 3.8 02/10/2014 0516   CL 104 09/24/2017 1539   CL 103 02/10/2014 0516   CO2 29 09/24/2017 1539   CO2 30 02/10/2014 0516   GLUCOSE 86 09/24/2017 1539   GLUCOSE 76 02/10/2014 0516   BUN 15 09/24/2017 1539   BUN 5 (L) 02/10/2014 0516   CREATININE 0.88 09/24/2017 1539   CREATININE 0.75 02/10/2014 0516   CALCIUM 9.2 09/24/2017 1539   CALCIUM 7.9 (L) 02/10/2014 0516   GFRNONAA >60 02/10/2014 0516   GFRNONAA >60 10/11/2012 1547   GFRAA >60 02/10/2014 0516   GFRAA >60 10/11/2012 1547    Lipid Panel     Component Value Date/Time   CHOL 156 09/24/2017 1539   TRIG 50.0 09/24/2017 1539   HDL 51.10 09/24/2017 1539   CHOLHDL 3 09/24/2017 1539   VLDL 10.0 09/24/2017 1539   LDLCALC 95 09/24/2017 1539    CBC    Component Value Date/Time   WBC 10.5 09/24/2017 1539   RBC 4.45 09/24/2017 1539   HGB 12.5 09/24/2017 1539   HGB 8.4 (L) 02/10/2014 1750   HCT 37.4 09/24/2017 1539   HCT 25.0 (L) 02/10/2014 0516   PLT 240.0 09/24/2017 1539   PLT 125 (L) 02/10/2014 0516   MCV 84.2 09/24/2017 1539   MCV 87 02/10/2014 0516   MCH 28.4 02/10/2014 0516   MCHC 33.4 09/24/2017 1539   RDW 12.7 09/24/2017 1539   RDW 14.4 02/10/2014 0516   LYMPHSABS 1.6 02/10/2014 0516   MONOABS 0.9 02/10/2014 0516   EOSABS 0.2 02/10/2014 0516   BASOSABS 0.0 02/10/2014 0516    Hgb A1C Lab Results  Component Value Date   HGBA1C 5.4 06/18/2016            Assessment & Plan:   Abnormal Weight Gain:  Discussed importance of continuing medication  until BMI is 28 Encouraged low carb, high protein diet and 150 minutes of exercise per week RX for Phentermine 37.5  mg provided Advised her that if she does not follow up in 1 month, I will no longer provide Phentermine for her  RTC in 1 month for weight check/med refill Webb Silversmith, NP

## 2018-02-09 ENCOUNTER — Encounter: Payer: Self-pay | Admitting: Internal Medicine

## 2018-02-09 ENCOUNTER — Ambulatory Visit: Payer: BLUE CROSS/BLUE SHIELD | Admitting: Internal Medicine

## 2018-02-09 VITALS — BP 122/84 | HR 88 | Temp 98.1°F | Wt 222.0 lb

## 2018-02-09 DIAGNOSIS — J069 Acute upper respiratory infection, unspecified: Secondary | ICD-10-CM

## 2018-02-09 DIAGNOSIS — B9789 Other viral agents as the cause of diseases classified elsewhere: Secondary | ICD-10-CM | POA: Diagnosis not present

## 2018-02-09 MED ORDER — METHYLPREDNISOLONE ACETATE 80 MG/ML IJ SUSP
80.0000 mg | Freq: Once | INTRAMUSCULAR | Status: AC
  Administered 2018-02-09: 80 mg via INTRAMUSCULAR

## 2018-02-09 NOTE — Progress Notes (Signed)
HPI  Pt presents to the clinic today with c/o sore throat and cough. She reports this started yesterday. She denies difficulty swallowing. The cough is productive of cream colored mucous. She denies runny nose, nasal congestion, ear pain or shortness of breath. She denies fever, chills or body aches. She has tried Copywriter, advertising and Nyquil with minimal relief. She has not had sick contacts. She does have a history of asthma.   Review of Systems      Past Medical History:  Diagnosis Date  . Asthma    ED eval in the past  . Sciatica     Family History  Problem Relation Age of Onset  . Thyroid disease Mother   . Hypertension Sister   . Miscarriages / Stillbirths Sister   . Stroke Sister   . Diabetes Father   . Prostate cancer Paternal Grandfather   . Cancer Paternal Grandfather        Prostate    Social History   Socioeconomic History  . Marital status: Single    Spouse name: Not on file  . Number of children: Not on file  . Years of education: Not on file  . Highest education level: Not on file  Occupational History  . Not on file  Social Needs  . Financial resource strain: Not on file  . Food insecurity:    Worry: Not on file    Inability: Not on file  . Transportation needs:    Medical: Not on file    Non-medical: Not on file  Tobacco Use  . Smoking status: Never Smoker  . Smokeless tobacco: Never Used  Substance and Sexual Activity  . Alcohol use: Yes    Alcohol/week: 0.0 standard drinks    Comment: Rare  . Drug use: No  . Sexual activity: Not on file  Lifestyle  . Physical activity:    Days per week: Not on file    Minutes per session: Not on file  . Stress: Not on file  Relationships  . Social connections:    Talks on phone: Not on file    Gets together: Not on file    Attends religious service: Not on file    Active member of club or organization: Not on file    Attends meetings of clubs or organizations: Not on file    Relationship status: Not on  file  . Intimate partner violence:    Fear of current or ex partner: Not on file    Emotionally abused: Not on file    Physically abused: Not on file    Forced sexual activity: Not on file  Other Topics Concern  . Not on file  Social History Narrative   Lives in [place] with mother, son and boyfriend in a one story home. 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      Education: college degree and 1 year of grad school.      Diet - regular diet             No Known Allergies   Constitutional: Denies headache, fatigue, fever or abrupt weight changes.  HEENT:  Positive sore throat. Denies eye redness, eye pain, pressure behind the eyes, facial pain, nasal congestion, ear pain, ringing in the ears, wax buildup, runny nose. Respiratory: Positive cough. Denies difficulty breathing or shortness of breath.  Cardiovascular: Denies chest pain, chest tightness, palpitations or swelling in the hands or feet.   No other specific complaints in a complete review of systems (except  as listed in HPI above).  Objective:   BP 122/84   Pulse 88   Temp 98.1 F (36.7 C) (Oral)   Wt 222 lb (100.7 kg)   LMP 04/09/2012   BMI 34.77 kg/m  Wt Readings from Last 3 Encounters:  02/09/18 222 lb (100.7 kg)  01/28/18 219 lb (99.3 kg)  10/06/17 212 lb (96.2 kg)     General: Appears her stated age, obese, in NAD. HEENT: Head: normal shape and size, no sinus tenderness;  Ears: Tm's gray and intact, normal light reflex; Nose: mucosa pink and moist, septum midline; Throat/Mouth: + PND. Teeth present, mucosa pink and moist, no exudate noted, no lesions or ulcerations noted.  Neck: No cervical lymphadenopathy.  Cardiovascular: Normal rate and rhythm. S1,S2 noted.  No murmur, rubs or gallops noted.  Pulmonary/Chest: Normal effort and positive vesicular breath sounds. No respiratory distress. No wheezes, rales or ronchi noted.       Assessment & Plan:   Viral Upper Respiratory Infection with Cough:  Get some  rest and drink plenty of water Do salt water gargles for the sore throat 80 mg Depo IM today Start Zyrtec and Flonase OTC Delsym as needed for cough  RTC as needed or if symptoms persist.   Webb Silversmith, NP

## 2018-02-09 NOTE — Patient Instructions (Signed)

## 2018-02-09 NOTE — Addendum Note (Signed)
Addended by: Lindalou Hose Y on: 02/09/2018 05:00 PM   Modules accepted: Orders

## 2018-03-02 ENCOUNTER — Encounter: Payer: Self-pay | Admitting: Family Medicine

## 2018-03-02 ENCOUNTER — Encounter: Payer: Self-pay | Admitting: Internal Medicine

## 2018-03-02 ENCOUNTER — Ambulatory Visit: Payer: BLUE CROSS/BLUE SHIELD | Admitting: Family Medicine

## 2018-03-02 VITALS — BP 108/64 | HR 62 | Temp 98.2°F | Resp 14 | Ht 67.0 in | Wt 216.0 lb

## 2018-03-02 DIAGNOSIS — J069 Acute upper respiratory infection, unspecified: Secondary | ICD-10-CM | POA: Diagnosis not present

## 2018-03-02 NOTE — Progress Notes (Signed)
Subjective:     Alexis Suarez is a 42 y.o. female presenting for Cough (Started to feel bad Saturday 02/28/18. Cough, runny nose, sweats, chills, nasal congestion, sore throat, achy. Her son was diagnosed with Flu B on 02/26/18 and treated with Tamiflu.)     URI   This is a new problem. The current episode started in the past 7 days. There has been no fever. Associated symptoms include congestion, coughing, neck pain, a plugged ear sensation, rhinorrhea, a sore throat and swollen glands. Pertinent negatives include no chest pain, diarrhea, headaches, nausea, sinus pain or vomiting. She has tried decongestant for the symptoms. The treatment provided mild relief.   Was here 2 weeks ago Got better with time But now is sick again  Review of Systems  Constitutional: Positive for chills. Negative for fatigue and fever.  HENT: Positive for congestion, postnasal drip, rhinorrhea and sore throat. Negative for sinus pressure and sinus pain.   Respiratory: Positive for cough. Negative for shortness of breath.   Cardiovascular: Negative for chest pain and palpitations.  Gastrointestinal: Negative for diarrhea, nausea and vomiting.  Musculoskeletal: Positive for arthralgias, myalgias and neck pain.  Neurological: Negative for headaches.     Social History   Tobacco Use  Smoking Status Never Smoker  Smokeless Tobacco Never Used        Objective:    BP Readings from Last 3 Encounters:  03/02/18 108/64  02/09/18 122/84  01/28/18 122/78   Wt Readings from Last 3 Encounters:  03/02/18 216 lb (98 kg)  02/09/18 222 lb (100.7 kg)  01/28/18 219 lb (99.3 kg)    BP 108/64   Pulse 62   Temp 98.2 F (36.8 C)   Resp 14   Ht 5\' 7"  (1.702 m)   Wt 216 lb (98 kg)   LMP 04/09/2012   BMI 33.83 kg/m    Physical Exam Constitutional:      General: She is not in acute distress.    Appearance: She is well-developed. She is not diaphoretic.  HENT:     Head: Normocephalic and  atraumatic.     Right Ear: Tympanic membrane and ear canal normal.     Left Ear: Ear canal normal. A middle ear effusion is present.     Nose: Mucosal edema and rhinorrhea present.     Right Sinus: No maxillary sinus tenderness or frontal sinus tenderness.     Left Sinus: No maxillary sinus tenderness or frontal sinus tenderness.     Mouth/Throat:     Pharynx: Uvula midline. Posterior oropharyngeal erythema present. No oropharyngeal exudate.     Tonsils: Swelling: 0 on the right. 0 on the left.  Eyes:     General: No scleral icterus.    Conjunctiva/sclera: Conjunctivae normal.  Neck:     Musculoskeletal: Neck supple.  Cardiovascular:     Rate and Rhythm: Normal rate and regular rhythm.     Heart sounds: Normal heart sounds. No murmur.  Pulmonary:     Effort: Pulmonary effort is normal. No respiratory distress.     Breath sounds: Normal breath sounds.  Lymphadenopathy:     Cervical: No cervical adenopathy.  Skin:    General: Skin is warm and dry.     Capillary Refill: Capillary refill takes less than 2 seconds.  Neurological:     Mental Status: She is alert.           Assessment & Plan:   Problem List Items Addressed This Visit  None    Visit Diagnoses    Viral URI    -  Primary     It is possible she has the flu, however at this point is approaching 72 hours of illness and too late for tamiflu. Though she has not developed fevers so may be another viral syndrome.   Symptomatic care Hand hygiene to prevent infection  Return if symptoms worsen or fail to improve.  Lesleigh Noe, MD

## 2018-03-02 NOTE — Patient Instructions (Addendum)
Based on your symptoms, it looks like you have a virus.   Antibiotics are not need for a viral infection but the following will help:   1. Drink plenty of fluids 2. Get lots of rest  Sinus Congestion 1) Saline spray -- 2 times day -- if tolerated 2) Flonase (Store Brand ok) - once daily 3) Over the counter congestion medications  Cough 1) Cough drops can be helpful 2) Nyquil (or nighttime cough medication) 3) Honey is proven to be one of the best cough medications   Sore Throat 1) Honey as above, cough drops 2) Ibuprofen or Aleve can be helpful 3) Salt water Gargles  If you develop fevers (Temperature >100.4), chills, worsening symptoms or symptoms lasting longer than 10 days return to clinic.

## 2018-04-03 ENCOUNTER — Ambulatory Visit: Payer: BLUE CROSS/BLUE SHIELD | Admitting: Internal Medicine

## 2018-04-03 ENCOUNTER — Encounter: Payer: Self-pay | Admitting: Internal Medicine

## 2018-04-03 NOTE — Progress Notes (Deleted)
Subjective:    Patient ID: Alexis Suarez, female    DOB: Apr 04, 1975, 43 y.o.   MRN: [id number]  HPI  Patient to the clinic today requesting a refill on her phentermine.  Her weight today is lbs with a BMI of.  She was prescribed phentermine 01/2018.  She did not return to the clinic as advised in 1 month for weight check and med.  She reports she takes the medication intermittently when she notices her weight trending back up.  Breakfast: Lunch: Dinner: Snacks: Exercise:  Review of Systems      Past Medical History:  Diagnosis Date  . Asthma    ED eval in the past  . Sciatica     Current Outpatient Medications  Medication Sig Dispense Refill  . phentermine 37.5 MG capsule Take 1 capsule (37.5 mg total) by mouth every morning. 30 capsule 0  . Polyethylene Glycol 3350 (PEG 3350) POWD Take by mouth.     No current facility-administered medications for this visit.     No Known Allergies  Family History  Problem Relation Age of Onset  . Thyroid disease Mother   . Hypertension Sister   . Miscarriages / Stillbirths Sister   . Stroke Sister   . Diabetes Father   . Prostate cancer Paternal Grandfather   . Cancer Paternal Grandfather        Prostate    Social History   Socioeconomic History  . Marital status: Single    Spouse name: Not on file  . Number of children: Not on file  . Years of education: Not on file  . Highest education level: Not on file  Occupational History  . Not on file  Social Needs  . Financial resource strain: Not on file  . Food insecurity:    Worry: Not on file    Inability: Not on file  . Transportation needs:    Medical: Not on file    Non-medical: Not on file  Tobacco Use  . Smoking status: Never Smoker  . Smokeless tobacco: Never Used  Substance and Sexual Activity  . Alcohol use: Yes    Alcohol/week: 0.0 standard drinks    Comment: Rare  . Drug use: No  . Sexual activity: Not on file  Lifestyle  . Physical activity:   Days per week: Not on file    Minutes per session: Not on file  . Stress: Not on file  Relationships  . Social connections:    Talks on phone: Not on file    Gets together: Not on file    Attends religious service: Not on file    Active member of club or organization: Not on file    Attends meetings of clubs or organizations: Not on file    Relationship status: Not on file  . Intimate partner violence:    Fear of current or ex partner: Not on file    Emotionally abused: Not on file    Physically abused: Not on file    Forced sexual activity: Not on file  Other Topics Concern  . Not on file  Social History Narrative   Lives in [place] with mother, son and boyfriend in a one story home. 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      Education: college degree and 1 year of grad school.      Diet - regular diet              Constitutional: Denies fever, malaise, fatigue, headache or  abrupt weight changes.  HEENT: Denies eye pain, eye redness, ear pain, ringing in the ears, wax buildup, runny nose, nasal congestion, bloody nose, or sore throat. Respiratory: Denies difficulty breathing, shortness of breath, cough or sputum production.   Cardiovascular: Denies chest pain, chest tightness, palpitations or swelling in the hands or feet.  Gastrointestinal: Denies abdominal pain, bloating, constipation, diarrhea or blood in the stool.  GU: Denies urgency, frequency, pain with urination, burning sensation, blood in urine, odor or discharge. Musculoskeletal: Denies decrease in range of motion, difficulty with gait, muscle pain or joint pain and swelling.  Skin: Denies redness, rashes, lesions or ulcercations.  Neurological: Denies dizziness, difficulty with memory, difficulty with speech or problems with balance and coordination.  Psych: Denies anxiety, depression, SI/HI.  No other specific complaints in a complete review of systems (except as listed in HPI above).  Objective:   Physical  Exam        Assessment & Plan:   Abnormal Weight Gain:  RTC in 1 month for weight check/med refill Webb Silversmith, NP

## 2018-04-10 ENCOUNTER — Encounter: Payer: Self-pay | Admitting: Internal Medicine

## 2018-04-10 ENCOUNTER — Ambulatory Visit (INDEPENDENT_AMBULATORY_CARE_PROVIDER_SITE_OTHER): Payer: Self-pay | Admitting: Internal Medicine

## 2018-04-10 VITALS — BP 118/76 | HR 64 | Temp 98.2°F | Wt 213.0 lb

## 2018-04-10 DIAGNOSIS — R635 Abnormal weight gain: Secondary | ICD-10-CM

## 2018-04-10 MED ORDER — PHENTERMINE HCL 37.5 MG PO CAPS: 37.5000 mg | ORAL_CAPSULE | ORAL | 1 refills | Status: DC

## 2018-04-12 ENCOUNTER — Encounter: Payer: Self-pay | Admitting: Internal Medicine

## 2018-04-12 NOTE — Patient Instructions (Signed)

## 2018-04-12 NOTE — Progress Notes (Signed)
Subjective:    Patient ID: Alexis Suarez, female    DOB: 02-16-1976, 43 y.o.   MRN: [id number]  HPI   Pt presents to the clinic today for weight check and med refill. She was started on Phentermine 01/2018. She has been taking it intermittently since that time. She denies adverse side effects. Her starting weight was 219 lbs with a BMI of 34.3. Her weight today is 213 lbs with a BMI of 33.36.  Breakfast: Protein Shakes Lunch: Salad with Chicken Dinner: Meat and 2 veggies Exercise: Gym 3 x week  Review of Systems  Past Medical History:  Diagnosis Date  . Asthma    ED eval in the past  . Sciatica     Current Outpatient Medications  Medication Sig Dispense Refill  . phentermine 37.5 MG capsule Take 1 capsule (37.5 mg total) by mouth every morning. 30 capsule 1  . Polyethylene Glycol 3350 (PEG 3350) POWD Take by mouth.     No current facility-administered medications for this visit.     No Known Allergies  Family History  Problem Relation Age of Onset  . Thyroid disease Mother   . Hypertension Sister   . Miscarriages / Stillbirths Sister   . Stroke Sister   . Diabetes Father   . Prostate cancer Paternal Grandfather   . Cancer Paternal Grandfather        Prostate    Social History   Socioeconomic History  . Marital status: Single    Spouse name: Not on file  . Number of children: Not on file  . Years of education: Not on file  . Highest education level: Not on file  Occupational History  . Not on file  Social Needs  . Financial resource strain: Not on file  . Food insecurity:    Worry: Not on file    Inability: Not on file  . Transportation needs:    Medical: Not on file    Non-medical: Not on file  Tobacco Use  . Smoking status: Never Smoker  . Smokeless tobacco: Never Used  Substance and Sexual Activity  . Alcohol use: Yes    Alcohol/week: 0.0 standard drinks    Comment: Rare  . Drug use: No  . Sexual activity: Not on file  Lifestyle  .  Physical activity:    Days per week: Not on file    Minutes per session: Not on file  . Stress: Not on file  Relationships  . Social connections:    Talks on phone: Not on file    Gets together: Not on file    Attends religious service: Not on file    Active member of club or organization: Not on file    Attends meetings of clubs or organizations: Not on file    Relationship status: Not on file  . Intimate partner violence:    Fear of current or ex partner: Not on file    Emotionally abused: Not on file    Physically abused: Not on file    Forced sexual activity: Not on file  Other Topics Concern  . Not on file  Social History Narrative   Lives in [place] with mother, son and boyfriend in a one story home. 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      Education: college degree and 1 year of grad school.      Diet - regular diet              Constitutional: Denies fever,  malaise, fatigue, headache or abrupt weight changes.  Respiratory: Denies difficulty breathing, shortness of breath, cough or sputum production.   Cardiovascular: Denies chest pain, chest tightness, palpitations or swelling in the hands or feet.  Neurological: Denies dizziness, difficulty with memory, difficulty with speech or problems with balance and coordination.    No other specific complaints in a complete review of systems (except as listed in HPI above).     Objective:   Physical Exam   BP 118/76   Pulse 64   Temp 98.2 F (36.8 C) (Oral)   Wt 213 lb (96.6 kg)   LMP 04/09/2012   BMI 33.36 kg/m  Wt Readings from Last 3 Encounters:  04/10/18 213 lb (96.6 kg)  03/02/18 216 lb (98 kg)  02/09/18 222 lb (100.7 kg)    General: Appears her stated age, well developed, well nourished in NAD. Cardiovascular: Normal rate and rhythm. S1,S2 noted.  No murmur, rubs or gallops noted.  Pulmonary/Chest: Normal effort and positive vesicular breath sounds. No respiratory distress. No wheezes, rales or ronchi noted.   Neurological: Alert and oriented.    BMET    Component Value Date/Time   NA 141 09/24/2017 1539   NA 139 02/10/2014 0516   K 4.0 09/24/2017 1539   K 3.8 02/10/2014 0516   CL 104 09/24/2017 1539   CL 103 02/10/2014 0516   CO2 29 09/24/2017 1539   CO2 30 02/10/2014 0516   GLUCOSE 86 09/24/2017 1539   GLUCOSE 76 02/10/2014 0516   BUN 15 09/24/2017 1539   BUN 5 (L) 02/10/2014 0516   CREATININE 0.88 09/24/2017 1539   CREATININE 0.75 02/10/2014 0516   CALCIUM 9.2 09/24/2017 1539   CALCIUM 7.9 (L) 02/10/2014 0516   GFRNONAA >60 02/10/2014 0516   GFRNONAA >60 10/11/2012 1547   GFRAA >60 02/10/2014 0516   GFRAA >60 10/11/2012 1547    Lipid Panel     Component Value Date/Time   CHOL 156 09/24/2017 1539   TRIG 50.0 09/24/2017 1539   HDL 51.10 09/24/2017 1539   CHOLHDL 3 09/24/2017 1539   VLDL 10.0 09/24/2017 1539   LDLCALC 95 09/24/2017 1539    CBC    Component Value Date/Time   WBC 10.5 09/24/2017 1539   RBC 4.45 09/24/2017 1539   HGB 12.5 09/24/2017 1539   HGB 8.4 (L) 02/10/2014 1750   HCT 37.4 09/24/2017 1539   HCT 25.0 (L) 02/10/2014 0516   PLT 240.0 09/24/2017 1539   PLT 125 (L) 02/10/2014 0516   MCV 84.2 09/24/2017 1539   MCV 87 02/10/2014 0516   MCH 28.4 02/10/2014 0516   MCHC 33.4 09/24/2017 1539   RDW 12.7 09/24/2017 1539   RDW 14.4 02/10/2014 0516   LYMPHSABS 1.6 02/10/2014 0516   MONOABS 0.9 02/10/2014 0516   EOSABS 0.2 02/10/2014 0516   BASOSABS 0.0 02/10/2014 0516    Hgb A1C Lab Results  Component Value Date   HGBA1C 5.4 06/18/2016           Assessment & Plan:   Abnormal Weight Gain:  Phentermine refilled today Encouraged high protein, low carb diet Encouraged 150 minutes of exercise per week  RTC in 2 months for weight check/med refill Webb Silversmith, NP

## 2018-06-17 ENCOUNTER — Ambulatory Visit (INDEPENDENT_AMBULATORY_CARE_PROVIDER_SITE_OTHER): Payer: BLUE CROSS/BLUE SHIELD | Admitting: Family Medicine

## 2018-06-17 ENCOUNTER — Other Ambulatory Visit: Payer: Self-pay

## 2018-06-17 ENCOUNTER — Encounter: Payer: Self-pay | Admitting: Family Medicine

## 2018-06-17 DIAGNOSIS — D369 Benign neoplasm, unspecified site: Secondary | ICD-10-CM

## 2018-06-17 DIAGNOSIS — Z13220 Encounter for screening for lipoid disorders: Secondary | ICD-10-CM

## 2018-06-17 DIAGNOSIS — E559 Vitamin D deficiency, unspecified: Secondary | ICD-10-CM | POA: Diagnosis not present

## 2018-06-17 DIAGNOSIS — R222 Localized swelling, mass and lump, trunk: Secondary | ICD-10-CM

## 2018-06-17 DIAGNOSIS — E6609 Other obesity due to excess calories: Secondary | ICD-10-CM

## 2018-06-17 DIAGNOSIS — Z131 Encounter for screening for diabetes mellitus: Secondary | ICD-10-CM

## 2018-06-17 DIAGNOSIS — M7989 Other specified soft tissue disorders: Secondary | ICD-10-CM | POA: Insufficient documentation

## 2018-06-17 DIAGNOSIS — Z90710 Acquired absence of both cervix and uterus: Secondary | ICD-10-CM | POA: Diagnosis not present

## 2018-06-17 DIAGNOSIS — Z1159 Encounter for screening for other viral diseases: Secondary | ICD-10-CM

## 2018-06-17 NOTE — Progress Notes (Signed)
Name: Alexis Suarez   MRN: [id number]    DOB: 07-05-1975   Date:06/17/2018       Progress Note  Subjective  Chief Complaint  Chief Complaint  Patient presents with  . Establish Care  . Cyst    on back for 3 weeks no pain     I connected with  Antionette Fairy  on 06/17/18 at  8:00 AM EDT by a video enabled telemedicine application and verified that I am speaking with the correct person using two identifiers.  I discussed the limitations of evaluation and management by telemedicine and the availability of in person appointments. The patient expressed understanding and agreed to proceed. Staff also discussed with the patient that there may be a patient responsible charge related to this service. Patient Location: Home Provider Location: Home Additional Individuals present: None  HPI  Pt presents to establish care and with the following concerns:  - She does paperwork for [facility] in [place] ([place]).  - Needs CPE - will schedule after COVID-19 pandemic allows.  Has history of hysterectomy in 2015.  - Tumor of LEFT flank - About 3 weeks ago she noticed a knot on her left side of her flank The area is non-tender, only present when she leans to the side, not present with she stands up.  She notes the area is soft and non-tender.  She has no family history of cancer.  Since she noticed the area, it has not gotten bigger, smaller, or changed in any way.   - Obesity: 215lbs - she has been taking phentermine, but has slacked off on this due to [place].  She has been exercising much more frequently.  Heaviest weight was 235lbs.  She tries to eat a small breakfast (eggs), lunch is usually light (avoiding fast food), currently cooking at home much of the time.  There are no active problems to display for this patient.  Past Surgical History:  Procedure Laterality Date  . ABDOMINAL HYSTERECTOMY  2015   partial  . BREAST SURGERY  2000   breast reduction  . CESAREAN SECTION   2012  . DILATION AND CURETTAGE OF UTERUS    . REDUCTION MAMMAPLASTY Bilateral 2000  . TONSILECTOMY/ADENOIDECTOMY WITH MYRINGOTOMY      Family History  Problem Relation Age of Onset  . Thyroid disease Mother   . Hypertension Sister   . Miscarriages / Stillbirths Sister   . Stroke Sister   . Diabetes Father   . Prostate cancer Paternal Grandfather   . Cancer Paternal Grandfather        Prostate    Social History   Socioeconomic History  . Marital status: Single    Spouse name: Not on file  . Number of children: 1  . Years of education: Not on file  . Highest education level: Not on file  Occupational History  . Not on file  Social Needs  . Financial resource strain: Not hard at all  . Food insecurity:    Worry: Never true    Inability: Never true  . Transportation needs:    Medical: No    Non-medical: No  Tobacco Use  . Smoking status: Never Smoker  . Smokeless tobacco: Never Used  Substance and Sexual Activity  . Alcohol use: Yes    Alcohol/week: 0.0 standard drinks    Comment: Rare  . Drug use: No  . Sexual activity: Yes    Partners: Male  Lifestyle  . Physical activity:  Days per week: 5 days    Minutes per session: 50 min  . Stress: Not at all  Relationships  . Social connections:    Talks on phone: More than three times a week    Gets together: More than three times a week    Attends religious service: More than 4 times per year    Active member of club or organization: Yes    Attends meetings of clubs or organizations: More than 4 times per year    Relationship status: Never married  . Intimate partner violence:    Fear of current or ex partner: No    Emotionally abused: No    Physically abused: No    Forced sexual activity: No  Other Topics Concern  . Not on file  Social History Narrative   Lives in [place] with mother, son and boyfriend in a one story home. 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      Education: college degree and 1 year of grad  school.      Diet - regular diet              Current Outpatient Medications:  .  phentermine 37.5 MG capsule, Take 1 capsule (37.5 mg total) by mouth every morning., Disp: 30 capsule, Rfl: 1 .  Polyethylene Glycol 3350 (PEG 3350) POWD, Take by mouth., Disp: , Rfl:   No Known Allergies  I personally reviewed active problem list, medication list, allergies, health maintenance, notes from last encounter, lab results with the patient/caregiver today.   ROS  Constitutional: Negative for fever or weight change.  Respiratory: Negative for cough and shortness of breath.   Cardiovascular: Negative for chest pain or palpitations.  Gastrointestinal: Negative for abdominal pain, no bowel changes.  Musculoskeletal: Negative for gait problem or joint swelling.  Skin: Negative for rash.  Neurological: Negative for dizziness or headache.  No other specific complaints in a complete review of systems (except as listed in HPI above).  Objective  Virtual encounter, vitals not obtained.  There is no height or weight on file to calculate BMI.  Physical Exam  Constitutional: Patient appears well-developed and well-nourished. No distress.  HENT: Head: Normocephalic and atraumatic.  Neck: Normal range of motion. Pulmonary/Chest: Effort normal. No respiratory distress. Speaking in complete sentences Neurological: Pt is alert and oriented to person, place, and time. Coordination, speech and gait are normal.  Psychiatric: Patient has a normal mood and affect. behavior is normal. Judgment and thought content normal. Skin: No erythema or tenderness, there is a moderately-sized, poorly defined soft tumor to the LEFT upper flank/lower ribcage.  Patient is directed to palpate over the area and it appears soft, she reports non-tender.  No results found for this or any previous visit (from the past 72 hour(s)).  PHQ2/9: Depression screen [facility] 2/9 06/17/2018 09/24/2017 08/13/2017 06/18/2016 06/14/2015   Decreased Interest 0 0 0 0 0  Down, Depressed, Hopeless 0 0 0 0 0  PHQ - 2 Score 0 0 0 0 0  Altered sleeping 0 - - - -  Tired, decreased energy 0 - - - -  Change in appetite 0 - - - -  Feeling bad or failure about yourself  0 - - - -  Trouble concentrating 0 - - - -  Moving slowly or fidgety/restless 0 - - - -  Suicidal thoughts 0 - - - -  PHQ-9 Score 0 - - - -  Difficult doing work/chores Not difficult at all - - - -  PHQ-2/9 Result is negative.    Fall Risk: Fall Risk  06/17/2018 08/13/2017 06/18/2016 05/16/2016 06/14/2015  Falls in the past year? 0 No No No No  Number falls in past yr: 0 - - - -  Injury with Fall? 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  Discussed   2. H/O: hysterectomy  3. Vitamin D deficiency - VITAMIN D 25 Hydroxy (Vit-D Deficiency, Fractures)  4. Obesity due to excess calories, unspecified classification, unspecified whether serious comorbidity present - Discussed importance of 150 minutes of physical activity weekly, eat two servings of fish weekly, eat one serving of tree nuts ( cashews, pistachios, pecans, almonds.Marland Kitchen) every other day, eat 6 servings of fruit/vegetables daily and drink plenty of water and avoid sweet beverages.  - COMPLETE METABOLIC PANEL WITH GFR - Lipid panel  5. Need for hepatitis C screening test - Hepatitis C antibody  6. Lipid screening - Lipid panel  7. Diabetes mellitus screening - COMPLETE METABOLIC PANEL WITH GFR  I discussed the assessment and treatment plan with the patient. The patient was provided an opportunity to ask questions and all were answered. The patient agreed with the plan and demonstrated an understanding of the instructions.  The patient was advised to call back or seek an in-person evaluation if the symptoms worsen or if the condition fails to improve as anticipated.  I provided 15 minutes of  non-face-to-face time during this encounter.

## 2018-07-01 ENCOUNTER — Ambulatory Visit: Payer: BLUE CROSS/BLUE SHIELD | Admitting: Family Medicine

## 2018-07-03 ENCOUNTER — Ambulatory Visit: Payer: BLUE CROSS/BLUE SHIELD | Admitting: Family Medicine

## 2018-07-03 ENCOUNTER — Encounter: Payer: Self-pay | Admitting: Family Medicine

## 2018-07-03 ENCOUNTER — Other Ambulatory Visit: Payer: Self-pay

## 2018-07-03 VITALS — BP 128/76 | HR 72 | Temp 97.6°F | Resp 16 | Ht 67.0 in | Wt 217.8 lb

## 2018-07-03 DIAGNOSIS — L819 Disorder of pigmentation, unspecified: Secondary | ICD-10-CM | POA: Diagnosis not present

## 2018-07-03 DIAGNOSIS — D369 Benign neoplasm, unspecified site: Secondary | ICD-10-CM | POA: Diagnosis not present

## 2018-07-03 DIAGNOSIS — B373 Candidiasis of vulva and vagina: Secondary | ICD-10-CM | POA: Diagnosis not present

## 2018-07-03 DIAGNOSIS — L309 Dermatitis, unspecified: Secondary | ICD-10-CM | POA: Diagnosis not present

## 2018-07-03 DIAGNOSIS — B3731 Acute candidiasis of vulva and vagina: Secondary | ICD-10-CM

## 2018-07-03 MED ORDER — FLUCONAZOLE 150 MG PO TABS: 150.0000 mg | ORAL_TABLET | Freq: Once | ORAL | 2 refills | Status: AC

## 2018-07-03 MED ORDER — FLUOCINONIDE-E 0.05 % EX CREA: 1.0000 "application " | TOPICAL_CREAM | CUTANEOUS | 0 refills | Status: DC

## 2018-07-03 NOTE — Progress Notes (Addendum)
Name: Alexis Suarez   MRN: [id number]    DOB: Jun 21, 1975   Date:07/03/2018       Progress Note  Subjective  Chief Complaint  Chief Complaint  Patient presents with  . Cyst    on left side    HPI  PT presents to follow up on Cyst on the LEFT ribcage.  Has been present for 2 months, and is non-tender, no redness. She was advised to monitor this at the last visit, and it has not gone away, so she is here for evaluation.  Facial Dermatitis: She was being prescribed Lidex-E by her dermatologist, but has had to switch dermatologists due to insurance changes and is waiting for a new patient appointment that is several months out.  We will rx refill today for her to get her through.   Vaginal yeast infections: She tends to get vaginal yeast infections when she is exercising more frequently.  Currently she is having vaginal itching and some white thick discharge.  Patient Active Problem List   Diagnosis Date Noted  . Dermoid tumor 06/17/2018  . Vitamin D deficiency 06/17/2018  . H/O: hysterectomy 04/15/2015  . Constipation 01/07/2014    Social History   Tobacco Use  . Smoking status: Never Smoker  . Smokeless tobacco: Never Used  Substance Use Topics  . Alcohol use: Yes    Alcohol/week: 0.0 standard drinks    Comment: Rare     Current Outpatient Medications:  .  phentermine 37.5 MG capsule, Take 1 capsule (37.5 mg total) by mouth every morning., Disp: 30 capsule, Rfl: 1 .  Polyethylene Glycol 3350 (PEG 3350) POWD, Take by mouth., Disp: , Rfl:   No Known Allergies  I personally reviewed active problem list, medication list, allergies, notes from last encounter, lab results with the patient/caregiver today.  ROS  Constitutional: Negative for fever or weight change.  Respiratory: Negative for cough and shortness of breath.   Cardiovascular: Negative for chest pain or palpitations.  Gastrointestinal: Negative for abdominal pain, no bowel changes.  Musculoskeletal:  Negative for gait problem or joint swelling.  Skin: Negative for rash.  Neurological: Negative for dizziness or headache.  No other specific complaints in a complete review of systems (except as listed in HPI above).  Objective  Vitals:   07/03/18 0905  BP: 128/76  Pulse: 72  Resp: 16  Temp: 97.6 F (36.4 C)  TempSrc: Oral  SpO2: 99%  Weight: 217 lb 12.8 oz (98.8 kg)  Height: 5\' 7"  (1.702 m)   Body mass index is 34.11 kg/m.  Nursing Note and Vital Signs reviewed.  Physical Exam  Constitutional: Patient appears well-developed and well-nourished. No distress.  HENT: Head: Normocephalic and atraumatic. Neck: Normal range of motion. Neck supple. No JVD present. No thyromegaly present.  Cardiovascular: Normal rate, regular rhythm and normal heart sounds.  No murmur heard. No BLE edema. Pulmonary/Chest: Effort normal and breath sounds normal. No respiratory distress. Musculoskeletal: Normal range of motion, no joint effusions. No gross deformities.  LEFT Lateral Ribcage exhibits moderately fluctuant mass under the skin that is roughly 3 inches in diameter and round, non-tender, non-erythematous.  Neurological: Pt is alert and oriented to person, place, and time. No cranial nerve deficit. Coordination, balance, strength, speech and gait are normal.  Skin: Skin is warm and dry. No rash noted. No erythema.  Psychiatric: Patient has a normal mood and affect. behavior is normal. Judgment and thought content normal.  No results found for this or any previous visit (  from the past 72 hour(s)).  Assessment & Plan  1. Dermoid tumor - DDX: ? Lipoma, Cyst; does not appear to infected.  - Ambulatory referral to General Surgery  2. Vaginal candidiasis - fluconazole (DIFLUCAN) 150 MG tablet; Take 1 tablet (150 mg total) by mouth once for 1 dose. DO NOT REPEAT for 3 days, no more than 2 doses in 2 weeks.  Dispense: 1 tablet; Refill: 2  3. Dermatitis of face - fluocinonide-emollient  (LIDEX-E) 0.05 % cream; Apply 1 application topically every 3 (three) days.  Dispense: 30 g; Refill: 0 - Ambulatory referral to Dermatology  4. Hyperpigmentation of skin - fluocinonide-emollient (LIDEX-E) 0.05 % cream; Apply 1 application topically every 3 (three) days.  Dispense: 30 g; Refill: 0 - Ambulatory referral to Dermatology  -Red flags and when to present for emergency care or RTC including fever >101.32F, chest pain, shortness of breath, new/worsening/un-resolving symptoms, reviewed with patient at time of visit. Follow up and care instructions discussed and provided in AVS.

## 2018-07-08 ENCOUNTER — Encounter: Payer: Self-pay | Admitting: General Surgery

## 2018-07-08 ENCOUNTER — Ambulatory Visit (INDEPENDENT_AMBULATORY_CARE_PROVIDER_SITE_OTHER): Payer: BLUE CROSS/BLUE SHIELD | Admitting: General Surgery

## 2018-07-08 ENCOUNTER — Other Ambulatory Visit: Payer: Self-pay

## 2018-07-08 VITALS — BP 136/87 | HR 79 | Temp 97.8°F | Ht 68.0 in | Wt 215.0 lb

## 2018-07-08 DIAGNOSIS — D171 Benign lipomatous neoplasm of skin and subcutaneous tissue of trunk: Secondary | ICD-10-CM | POA: Diagnosis not present

## 2018-07-08 NOTE — Progress Notes (Signed)
Patient ID: Alexis Suarez, female   DOB: 11/24/1975, 43 y.o.   MRN: [id number]  Chief Complaint  Patient presents with  . Other    lipoma    HPI Alexis Suarez is a 43 y.o. female.   She was referred by her primary care provider, [name], FNP, for evaluation of a soft tissue mass on her rib cage.  [name] states that she was simply stretching in the mirror 1 day and noticed a small bulge on her left side.  She says that she panicked and worried that it might be breast cancer.  Her mother quickly calmed her down, pointing out that it was quite lateral and caudal to where her breast tissue would be.  She saw [name], who referred her for surgical evaluation.  [name] reports that had she not been looking in the mirror when she stretched, she would not have noticed the mass.  It is not painful.  It does not rub against her bra.  It is never been red, warm, nor drained any fluid of any type.  She wants to know what it is, and if there is anything she needs to do about it.   Past Medical History:  Diagnosis Date  . Asthma    ED eval in the past  . Sciatica     Past Surgical History:  Procedure Laterality Date  . ABDOMINAL HYSTERECTOMY  2015   partial  . BREAST SURGERY  2000   breast reduction  . CESAREAN SECTION  2012  . DILATION AND CURETTAGE OF UTERUS    . REDUCTION MAMMAPLASTY Bilateral 2000  . TONSILECTOMY/ADENOIDECTOMY WITH MYRINGOTOMY      Family History  Problem Relation Age of Onset  . Thyroid disease Mother   . Hypertension Sister   . Miscarriages / Stillbirths Sister   . Stroke Sister   . Diabetes Father   . Prostate cancer Paternal Grandfather   . Cancer Paternal Grandfather        Prostate    Social History Social History   Tobacco Use  . Smoking status: Never Smoker  . Smokeless tobacco: Never Used  Substance Use Topics  . Alcohol use: Yes    Alcohol/week: 0.0 standard drinks    Comment: Rare  . Drug use: No    No Known Allergies   Current Outpatient Medications  Medication Sig Dispense Refill  . fluocinonide-emollient (LIDEX-E) 0.05 % cream Apply 1 application topically every 3 (three) days. 30 g 0  . phentermine 37.5 MG capsule Take 1 capsule (37.5 mg total) by mouth every morning. 30 capsule 1  . Polyethylene Glycol 3350 (PEG 3350) POWD Take by mouth.     No current facility-administered medications for this visit.     Review of Systems Review of Systems  All other systems reviewed and are negative.   Blood pressure 136/87, pulse 79, temperature 97.8 F (36.6 C), temperature source Skin, height 5\' 8"  (1.727 m), weight 215 lb (97.5 kg), last menstrual period 04/09/2012, SpO2 99 %.  Physical Exam Physical Exam Vitals signs reviewed.  Constitutional:      General: She is not in acute distress.    Appearance: Normal appearance.  HENT:     Head: Normocephalic and atraumatic.     Nose:     Comments: Wearing mask    Mouth/Throat:     Comments: Wearing mask Eyes:     General: No scleral icterus.       Right eye: No discharge.  Left eye: No discharge.     Conjunctiva/sclera: Conjunctivae normal.  Cardiovascular:     Rate and Rhythm: Normal rate.  Pulmonary:     Effort: Pulmonary effort is normal.    Chest:       Comments: Soft, mobile, subcutaneous mass; no fluctuance, erythema, or induration.  Approximately 3 to 4 cm in greatest dimension. Genitourinary:    Comments: Deferred Musculoskeletal: Normal range of motion.  Skin:    General: Skin is warm and dry.  Neurological:     General: No focal deficit present.     Mental Status: She is alert and oriented to person, place, and time.  Psychiatric:        Mood and Affect: Mood normal.        Behavior: Behavior normal.        Thought Content: Thought content normal.        Judgment: Judgment normal.     Data Reviewed No relevant data to review  Assessment [name] is a 43 year old woman woman who noticed a soft tissue mass on her left  rib cage.  This lesion is most consistent with a lipoma.  At its current size, the risk of malignancy is extremely small.  It is not bothering her.  I reassured her that there was no aggressive intervention needed.  Plan If the lesion grows or starts to bother her, she will contact me for an appointment to discuss surgical removal.    [name] 07/08/2018, 1:47 PM

## 2018-07-12 ENCOUNTER — Encounter: Payer: Self-pay | Admitting: Family Medicine

## 2018-07-23 ENCOUNTER — Other Ambulatory Visit: Payer: Self-pay

## 2018-07-23 ENCOUNTER — Ambulatory Visit (INDEPENDENT_AMBULATORY_CARE_PROVIDER_SITE_OTHER): Payer: BLUE CROSS/BLUE SHIELD | Admitting: Family Medicine

## 2018-07-23 ENCOUNTER — Encounter: Payer: Self-pay | Admitting: Family Medicine

## 2018-07-23 ENCOUNTER — Other Ambulatory Visit (HOSPITAL_COMMUNITY)
Admission: RE | Admit: 2018-07-23 | Discharge: 2018-07-23 | Disposition: A | Discharge status: Home / Self Care | Payer: BLUE CROSS/BLUE SHIELD | Source: Ambulatory Visit | Attending: Family Medicine | Admitting: Family Medicine

## 2018-07-23 VITALS — BP 112/64 | HR 97 | Temp 98.3°F | Resp 12 | Ht 67.0 in | Wt 207.4 lb

## 2018-07-23 DIAGNOSIS — R1031 Right lower quadrant pain: Secondary | ICD-10-CM

## 2018-07-23 DIAGNOSIS — R1032 Left lower quadrant pain: Secondary | ICD-10-CM | POA: Insufficient documentation

## 2018-07-23 DIAGNOSIS — Z113 Encounter for screening for infections with a predominantly sexual mode of transmission: Secondary | ICD-10-CM | POA: Diagnosis not present

## 2018-07-23 DIAGNOSIS — M5416 Radiculopathy, lumbar region: Secondary | ICD-10-CM | POA: Diagnosis not present

## 2018-07-23 NOTE — Progress Notes (Signed)
Name: Alexis Suarez   MRN: [id number]    DOB: 1975-10-04   Date:07/23/2018       Progress Note  Subjective  Chief Complaint  Chief Complaint  Patient presents with  . Back Pain    possible sciatica on and off for 7yrs+, past Dr. recommended MRI    HPI  Pt presents to discuss her back pain that has been ongoing - chronic with several acute episodes in the past.  She will go 6-7 months without any pain, then will have a significant flare that feels debilitating.  This stated 04/22/2015 and has been an intermittent problem since then. Back Pain Questionnaire Location: right lumbar area or left lumbar area Radiates to:  radiates to the buttock bilaterally depending on the day Pain severity:  4 /10 gets up to a 10 Pain quality:  constant Onset quality:  waxing and waning Duration:  2017 intermittently Timing: intermittent Progression:stable Context (when does pain occur): physical activity Ineffective treatments: nothing OTC works; prednisone has worked very well for her. Associated symptoms: tingling in BLE sometimes Red Flags for Back Pain: No loss of bowel/bladder control, no BLE weakness.  Lower Abdominal Discomfort: She is still having lower abdominal discomfort that is very mild but still bothering her.  She took Diflucan recent for suspected Candida infection.  Current no vaginal itching or discomfort, no dysuria, no abnormal vaingal discharge.  Does tend to have constipation and is taking Miralax, does not think she is constipation at this time. No NVD, no fevers or chills, no hematuria, no hematochezia.  Patient Active Problem List   Diagnosis Date Noted  . 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 Vitamin D deficiency 06/17/2018  . H/O: hysterectomy 04/15/2015  . Constipation 01/07/2014    Social History   Tobacco Use  . Smoking status: Never Smoker  . Smokeless tobacco: Never Used  Substance Use Topics  . Alcohol use: Yes    Alcohol/week: 0.0 standard drinks    Comment:  Rare     Current Outpatient Medications:  .  fluocinonide-emollient (LIDEX-E) 0.05 % cream, Apply 1 application topically every 3 (three) days., Disp: 30 g, Rfl: 0 .  phentermine 37.5 MG capsule, Take 1 capsule (37.5 mg total) by mouth every morning., Disp: 30 capsule, Rfl: 1 .  Polyethylene Glycol 3350 (PEG 3350) POWD, Take by mouth., Disp: , Rfl:  .  spironolactone (ALDACTONE) 100 MG tablet, Take 50 mg by mouth daily., Disp: , Rfl:   No Known Allergies  I personally reviewed active problem list, medication list, allergies, notes from last encounter, lab results with the patient/caregiver today.  ROS  Ten systems reviewed and is negative except as mentioned in HPI  Objective  Vitals:   07/23/18 0915  BP: 112/64  Pulse: 97  Resp: 12  Temp: 98.3 F (36.8 C)  TempSrc: Oral  SpO2: 98%  Weight: 207 lb 6.4 oz (94.1 kg)  Height: 5\' 7"  (1.702 m)   Body mass index is 32.48 kg/m.  Nursing Note and Vital Signs reviewed.  Physical Exam  Constitutional: Patient appears well-developed and well-nourished. No distress.  HENT: Head: Normocephalic and atraumatic. Eyes: Conjunctivae and EOM are normal. No scleral icterus.   Neck: Normal range of motion. Neck supple. No JVD present. No thyromegaly present.  Cardiovascular: Normal rate, regular rhythm and normal heart sounds.  No murmur heard. No BLE edema. Pulmonary/Chest: Effort normal and breath sounds normal. No respiratory distress. Abdominal: Soft. Bowel sounds are normal, no distension. There is no tenderness.  No masses.  No CVA tenderness. Musculoskeletal: Normal range of motion, no joint effusions. No gross deformities Neurological: Pt is alert and oriented to person, place, and time. No cranial nerve deficit. Coordination, balance, strength, speech and gait are normal.  Skin: Skin is warm and dry. No rash noted. No erythema.  Psychiatric: Patient has a normal mood and affect. behavior is normal. Judgment and thought content  normal.  No results found for this or any previous visit (from the past 72 hour(s)).  Assessment & Plan  1. Lumbar radiculopathy - Ambulatory referral to Neurosurgery  2. Bilateral lower abdominal discomfort - Urine Culture - Urinalysis - Cervicovaginal ancillary only  3. Routine screening for STI (sexually transmitted infection) - HIV Antibody (routine testing w rflx) - RPR  -Red flags and when to present for emergency care or RTC including fever >101.88F, chest pain, shortness of breath, new/worsening/un-resolving symptoms, reviewed with patient at time of visit. Follow up and care instructions discussed and provided in AVS.

## 2018-07-24 LAB — CERVICOVAGINAL ANCILLARY ONLY
Bacterial vaginitis: NEGATIVE
Candida vaginitis: POSITIVE — AB
Chlamydia: NEGATIVE
Neisseria Gonorrhea: NEGATIVE
Trichomonas: NEGATIVE

## 2018-07-25 LAB — URINALYSIS
Bilirubin Urine: NEGATIVE
Glucose, UA: NEGATIVE
Hgb urine dipstick: NEGATIVE
Nitrite: NEGATIVE
Specific Gravity, Urine: 1.027 (ref 1.001–1.03)
pH: 7 (ref 5.0–8.0)

## 2018-07-25 LAB — URINE CULTURE
MICRO NUMBER:: 497213
SPECIMEN QUALITY:: ADEQUATE

## 2018-07-26 ENCOUNTER — Other Ambulatory Visit: Payer: Self-pay | Admitting: Family Medicine

## 2018-07-26 ENCOUNTER — Encounter: Payer: Self-pay | Admitting: Family Medicine

## 2018-07-26 DIAGNOSIS — B373 Candidiasis of vulva and vagina: Secondary | ICD-10-CM

## 2018-07-26 DIAGNOSIS — N3001 Acute cystitis with hematuria: Secondary | ICD-10-CM

## 2018-07-26 DIAGNOSIS — B3731 Acute candidiasis of vulva and vagina: Secondary | ICD-10-CM

## 2018-07-26 DIAGNOSIS — N3000 Acute cystitis without hematuria: Secondary | ICD-10-CM

## 2018-07-26 MED ORDER — NITROFURANTOIN MONOHYD MACRO 100 MG PO CAPS: 100.0000 mg | ORAL_CAPSULE | Freq: Two times a day (BID) | ORAL | 0 refills | Status: AC

## 2018-07-26 MED ORDER — FLUCONAZOLE 150 MG PO TABS: 150.0000 mg | ORAL_TABLET | ORAL | 0 refills | Status: DC

## 2018-08-17 ENCOUNTER — Other Ambulatory Visit: Payer: Self-pay | Admitting: Internal Medicine

## 2018-08-18 ENCOUNTER — Encounter: Payer: Self-pay | Admitting: Family Medicine

## 2018-08-27 ENCOUNTER — Encounter: Payer: Self-pay | Admitting: Family Medicine

## 2018-08-27 MED ORDER — QSYMIA 3.75-23 MG PO CP24: ORAL_CAPSULE | ORAL | 0 refills | Status: DC

## 2018-09-22 ENCOUNTER — Encounter: Payer: Self-pay | Admitting: Family Medicine

## 2018-09-22 ENCOUNTER — Other Ambulatory Visit: Payer: Self-pay

## 2018-09-22 ENCOUNTER — Ambulatory Visit (INDEPENDENT_AMBULATORY_CARE_PROVIDER_SITE_OTHER): Payer: BLUE CROSS/BLUE SHIELD | Admitting: Family Medicine

## 2018-09-22 VITALS — BP 112/72 | HR 74 | Temp 97.9°F | Resp 14 | Ht 67.0 in | Wt 211.3 lb

## 2018-09-22 DIAGNOSIS — E6609 Other obesity due to excess calories: Secondary | ICD-10-CM | POA: Diagnosis not present

## 2018-09-22 MED ORDER — QSYMIA 7.5-46 MG PO CP24: 1.0000 | ORAL_CAPSULE | ORAL | 2 refills | Status: DC

## 2018-09-22 NOTE — Progress Notes (Signed)
Name: Alexis Suarez   MRN: [id number]    DOB: [birth year]   Date:09/22/2018       Progress Note  Subjective  Chief Complaint  Chief Complaint  Patient presents with  . Follow-up    medication refill  . Obesity    HPI  Pt presents for obesity follow up.  She has been taking Qsymia. She is up 4lbs since last visit.  She has not been able to get to the gym due to [place], but is planning to restart soon.  She has not made many changes to her diet, but is working it. She denies heart palpitations, HR and BP is normal.   Patient Active Problem List   Diagnosis Date Noted  . 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 Vitamin D deficiency 06/17/2018  . H/O: hysterectomy 04/15/2015  . Constipation 01/07/2014    Past Surgical History:  Procedure Laterality Date  . ABDOMINAL HYSTERECTOMY  2015   partial  . BREAST SURGERY  2000   breast reduction  . CESAREAN SECTION  2012  . DILATION AND CURETTAGE OF UTERUS    . REDUCTION MAMMAPLASTY Bilateral 2000  . TONSILECTOMY/ADENOIDECTOMY WITH MYRINGOTOMY      Family History  Problem Relation Age of Onset  . Thyroid disease Mother   . Hypertension Sister   . Miscarriages / Stillbirths Sister   . Stroke Sister   . Diabetes Father   . Prostate cancer Paternal Grandfather   . Cancer Paternal Grandfather        Prostate    Social History   Socioeconomic History  . Marital status: Single    Spouse name: Not on file  . Number of children: 1  . Years of education: Not on file  . Highest education level: Not on file  Occupational History  . Not on file  Social Needs  . Financial resource strain: Not hard at all  . Food insecurity    Worry: Never true    Inability: Never true  . Transportation needs    Medical: No    Non-medical: No  Tobacco Use  . Smoking status: Never Smoker  . Smokeless tobacco: Never Used  Substance and Sexual Activity  . Alcohol use: Yes    Alcohol/week: 0.0 standard drinks    Comment: Rare  . Drug use: No  .  Sexual activity: Yes    Partners: Male  Lifestyle  . Physical activity    Days per week: 5 days    Minutes per session: 50 min  . Stress: Not at all  Relationships  . Social connections    Talks on phone: More than three times a week    Gets together: More than three times a week    Attends religious service: More than 4 times per year    Active member of club or organization: Yes    Attends meetings of clubs or organizations: More than 4 times per year    Relationship status: Never married  . Intimate partner violence    Fear of current or ex partner: No    Emotionally abused: No    Physically abused: No    Forced sexual activity: No  Other Topics Concern  . Not on file  Social History Narrative   Lives in [place] with mother, son and boyfriend in a one story home. 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      Education: college degree and 1 year of grad school.      Diet -  regular diet              Current Outpatient Medications:  .  Phentermine-Topiramate (QSYMIA) 3.75-23 MG CP24, Take 1 tablet by mouth every morning for 7 days, THEN 2 tablets every morning for 23 days., Disp: 53 capsule, Rfl: 0 .  spironolactone (ALDACTONE) 100 MG tablet, Take 50 mg by mouth daily., Disp: , Rfl:   No Known Allergies  I personally reviewed active problem list, medication list, allergies, notes from last encounter, lab results with the patient/caregiver today.   ROS  Constitutional: Negative for fever or weight change.  Respiratory: Negative for cough and shortness of breath.   Cardiovascular: Negative for chest pain or palpitations.  Gastrointestinal: Negative for abdominal pain, no bowel changes.  Musculoskeletal: Negative for gait problem or joint swelling.  Skin: Negative for rash.  Neurological: Negative for dizziness or headache.  No other specific complaints in a complete review of systems (except as listed in HPI above).  Objective  Vitals:   09/22/18 1015  BP: 112/72   Pulse: 74  Resp: 14  Temp: 97.9 F (36.6 C)  TempSrc: Oral  SpO2: 98%  Weight: 211 lb 4.8 oz (95.8 kg)  Height: 5\' 7"  (1.702 m)   Body mass index is 33.09 kg/m.  Physical Exam  Constitutional: Patient appears well-developed and well-nourished. No distress.  HENT: Head: Normocephalic and atraumatic. Eyes: Conjunctivae and EOM are normal. No scleral icterus.   Neck: Normal range of motion. Neck supple. No JVD present. No thyromegaly present.  Cardiovascular: Normal rate, regular rhythm and normal heart sounds.  No murmur heard. No BLE edema. Pulmonary/Chest: Effort normal and breath sounds normal. No respiratory distress. Musculoskeletal: Normal range of motion, no joint effusions. No gross deformities Neurological: Pt is alert and oriented to person, place, and time. No cranial nerve deficit. Coordination, balance, strength, speech and gait are normal.  Skin: Skin is warm and dry. No rash noted. No erythema.  Psychiatric: Patient has a normal mood and affect. behavior is normal. Judgment and thought content normal.  No results found for this or any previous visit (from the past 72 hour(s)).  PHQ2/9: Depression screen [facility] 2/9 09/22/2018 07/23/2018 07/03/2018 06/17/2018 09/24/2017  Decreased Interest 0 0 0 0 0  Down, Depressed, Hopeless 0 0 0 0 0  PHQ - 2 Score 0 0 0 0 0  Altered sleeping 0 0 0 0 -  Tired, decreased energy 0 0 0 0 -  Change in appetite 0 0 0 0 -  Feeling bad or failure about yourself  0 0 0 0 -  Trouble concentrating 0 0 0 0 -  Moving slowly or fidgety/restless 0 0 0 0 -  Suicidal thoughts 0 0 0 0 -  PHQ-9 Score 0 0 0 0 -  Difficult doing work/chores Not difficult at all Not difficult at all Not difficult at all Not difficult at all -   PHQ-2/9 Result is negative.    Fall Risk: Fall Risk  09/22/2018 07/23/2018 07/03/2018 06/17/2018 08/13/2017  Falls in the past year? 0 0 0 0 No  Number falls in past yr: 0 0 0 0 -  Injury with Fall? 0 0 0 0 -  Follow up Falls  evaluation completed - Falls evaluation completed - -   Assessment & Plan  1. Obesity due to excess calories, unspecified classification, unspecified whether serious comorbidity present - Phentermine-Topiramate (QSYMIA) 7.5-46 MG CP24; Take 1 tablet by mouth every morning.  Dispense: 30 capsule; Refill: 2 - Discussed  importance of 150 minutes of physical activity weekly, eat two servings of fish weekly, eat one serving of tree nuts ( cashews, pistachios, pecans, almonds.Marland Kitchen) every other day, eat 6 servings of fruit/vegetables daily and drink plenty of water and avoid sweet beverages.

## 2018-09-22 NOTE — Patient Instructions (Signed)

## 2018-10-30 ENCOUNTER — Ambulatory Visit: Payer: BLUE CROSS/BLUE SHIELD

## 2018-11-03 ENCOUNTER — Encounter: Payer: Self-pay | Admitting: Family Medicine

## 2018-11-18 ENCOUNTER — Ambulatory Visit
Admission: RE | Admit: 2018-11-18 | Discharge: 2018-11-18 | Disposition: A | Discharge status: Home / Self Care | Payer: BLUE CROSS/BLUE SHIELD | Source: Ambulatory Visit | Attending: Family Medicine | Admitting: Family Medicine

## 2018-11-18 ENCOUNTER — Other Ambulatory Visit: Payer: Self-pay

## 2018-11-18 ENCOUNTER — Ambulatory Visit (INDEPENDENT_AMBULATORY_CARE_PROVIDER_SITE_OTHER): Payer: BLUE CROSS/BLUE SHIELD | Admitting: Family Medicine

## 2018-11-18 ENCOUNTER — Encounter: Payer: Self-pay | Admitting: Family Medicine

## 2018-11-18 ENCOUNTER — Ambulatory Visit
Admission: RE | Admit: 2018-11-18 | Discharge: 2018-11-18 | Disposition: A | Discharge status: Home / Self Care | Payer: BLUE CROSS/BLUE SHIELD | Attending: Family Medicine | Admitting: Family Medicine

## 2018-11-18 VITALS — BP 124/76 | HR 70 | Temp 97.9°F | Resp 16 | Ht 69.0 in | Wt 210.1 lb

## 2018-11-18 DIAGNOSIS — Z114 Encounter for screening for human immunodeficiency virus [HIV]: Secondary | ICD-10-CM

## 2018-11-18 DIAGNOSIS — Z01818 Encounter for other preprocedural examination: Secondary | ICD-10-CM

## 2018-11-18 DIAGNOSIS — Z13220 Encounter for screening for lipoid disorders: Secondary | ICD-10-CM

## 2018-11-18 DIAGNOSIS — Z90710 Acquired absence of both cervix and uterus: Secondary | ICD-10-CM

## 2018-11-18 DIAGNOSIS — E559 Vitamin D deficiency, unspecified: Secondary | ICD-10-CM | POA: Diagnosis not present

## 2018-11-18 DIAGNOSIS — E6609 Other obesity due to excess calories: Secondary | ICD-10-CM

## 2018-11-18 DIAGNOSIS — Z1159 Encounter for screening for other viral diseases: Secondary | ICD-10-CM

## 2018-11-18 DIAGNOSIS — Z113 Encounter for screening for infections with a predominantly sexual mode of transmission: Secondary | ICD-10-CM

## 2018-11-18 LAB — POCT URINALYSIS DIPSTICK
Bilirubin, UA: NEGATIVE
Blood, UA: NEGATIVE
Glucose, UA: NEGATIVE
Ketones, UA: NEGATIVE
Leukocytes, UA: NEGATIVE
Nitrite, UA: NEGATIVE
Protein, UA: NEGATIVE
Spec Grav, UA: 1.015 (ref 1.010–1.025)
Urobilinogen, UA: NEGATIVE E.U./dL — AB
pH, UA: 5 (ref 5.0–8.0)

## 2018-11-18 NOTE — Progress Notes (Signed)
Name: Alexis Suarez   MRN: [id number]    DOB: [birth year]   Date:11/18/2018       Progress Note  Subjective  Chief Complaint  Chief Complaint  Patient presents with  . Consult    Liposuction clearance    I connected with  Antionette Fairy  on 11/18/18 at  9:00 AM EDT by a video enabled telemedicine application and verified that I am speaking with the correct person using two identifiers.  I discussed the limitations of evaluation and management by telemedicine and the availability of in person appointments. The patient expressed understanding and agreed to proceed. Staff also discussed with the patient that there may be a patient responsible charge related to this service. Patient Location: Office Provider Location: Home Additional Individuals present: None  HPI  Pt presents today for surgical clearance as she prepares for Liposuction procedure.  She is feeling well today overall.  She has been having ongoing weight loss with her diet, exercise, and medication.  Has had anesthesia several times in the past and never had any anesthesia complications; no history of sleep apnea.  She does not have any cardiac or respiratory illnesses/chronic diseases and is overall feeling well today.   Past Surgical History:  Procedure Laterality Date  . ABDOMINAL HYSTERECTOMY  2015   partial  . BREAST SURGERY  2000   breast reduction  . CESAREAN SECTION  2012  . DILATION AND CURETTAGE OF UTERUS    . REDUCTION MAMMAPLASTY Bilateral 2000  . TONSILECTOMY/ADENOIDECTOMY WITH MYRINGOTOMY        Patient Active Problem List   Diagnosis Date Noted  . 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 Vitamin D deficiency 06/17/2018  . H/O: hysterectomy 04/15/2015  . Constipation 01/07/2014    Past Surgical History:  Procedure Laterality Date  . ABDOMINAL HYSTERECTOMY  2015   partial  . BREAST SURGERY  2000   breast reduction  . CESAREAN SECTION  2012  . DILATION AND CURETTAGE OF UTERUS    . REDUCTION  MAMMAPLASTY Bilateral 2000  . TONSILECTOMY/ADENOIDECTOMY WITH MYRINGOTOMY      Family History  Problem Relation Age of Onset  . Thyroid disease Mother   . Hypertension Sister   . Miscarriages / Stillbirths Sister   . Stroke Sister   . Diabetes Father   . Prostate cancer Paternal Grandfather   . Cancer Paternal Grandfather        Prostate    Social History   Socioeconomic History  . Marital status: Single    Spouse name: Not on file  . Number of children: 1  . Years of education: Not on file  . Highest education level: Not on file  Occupational History  . Not on file  Social Needs  . Financial resource strain: Not hard at all  . Food insecurity    Worry: Never true    Inability: Never true  . Transportation needs    Medical: No    Non-medical: No  Tobacco Use  . Smoking status: Never Smoker  . Smokeless tobacco: Never Used  Substance and Sexual Activity  . Alcohol use: Yes    Alcohol/week: 0.0 standard drinks    Comment: Rare  . Drug use: No  . Sexual activity: Yes    Partners: Male  Lifestyle  . Physical activity    Days per week: 5 days    Minutes per session: 50 min  . Stress: Not at all  Relationships  . Social connections  Talks on phone: More than three times a week    Gets together: More than three times a week    Attends religious service: More than 4 times per year    Active member of club or organization: Yes    Attends meetings of clubs or organizations: More than 4 times per year    Relationship status: Never married  . Intimate partner violence    Fear of current or ex partner: No    Emotionally abused: No    Physically abused: No    Forced sexual activity: No  Other Topics Concern  . Not on file  Social History Narrative   Lives in [place] with mother, son and boyfriend in a one story home. 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      Education: college degree and 1 year of grad school.      Diet - regular diet              Current  Outpatient Medications:  .  Phentermine-Topiramate (QSYMIA) 7.5-46 MG CP24, Take 1 tablet by mouth every morning., Disp: 30 capsule, Rfl: 2 .  spironolactone (ALDACTONE) 100 MG tablet, Take 50 mg by mouth daily., Disp: , Rfl:   No Known Allergies  I personally reviewed active problem list, medication list, allergies, notes from last encounter, lab results with the patient/caregiver today.   ROS  Constitutional: Negative for fever or weight change.  Respiratory: Negative for cough and shortness of breath.   Cardiovascular: Negative for chest pain or palpitations.  Gastrointestinal: Negative for abdominal pain, no bowel changes.  Musculoskeletal: Negative for gait problem or joint swelling.  Skin: Negative for rash.  Neurological: Negative for dizziness or headache.  No other specific complaints in a complete review of systems (except as listed in HPI above).  Objective  Today's Vitals   11/18/18 0847  BP: 124/76  Pulse: 70  Resp: 16  Temp: 97.9 F (36.6 C)  TempSrc: Oral  Weight: 210 lb 1.6 oz (95.3 kg)  Height: 5\' 9"  (1.753 m)   Body mass index is 31.03 kg/m.  Physical Exam  Constitutional: Patient appears well-developed and well-nourished. No distress.  HENT: Head: Normocephalic and atraumatic.  Neck: Normal range of motion. Pulmonary/Chest: Effort normal. No respiratory distress. Speaking in complete sentences Neurological: Pt is alert and oriented to person, place, and time. Coordination, speech and gait are normal.  Psychiatric: Patient has a normal mood and affect. behavior is normal. Judgment and thought content normal.  Results for orders placed or performed in visit on 11/18/18 (from the past 72 hour(s))  POCT urinalysis dipstick     Status: Abnormal   Collection Time: 11/18/18  9:32 AM  Result Value Ref Range   Color, UA gold    Clarity, UA clear    Glucose, UA Negative Negative   Bilirubin, UA negative    Ketones, UA negative    Spec Grav, UA 1.015  1.010 - 1.025   Blood, UA negative    pH, UA 5.0 5.0 - 8.0   Protein, UA Negative Negative   Urobilinogen, UA negative (A) 0.2 or 1.0 E.U./dL   Nitrite, UA negative    Leukocytes, UA Negative Negative   Appearance clear    Odor none     PHQ2/9: Depression screen [facility] 2/9 11/18/2018 09/22/2018 07/23/2018 07/03/2018 06/17/2018  Decreased Interest 0 0 0 0 0  Down, Depressed, Hopeless 0 0 0 0 0  PHQ - 2 Score 0 0 0 0 0  Altered sleeping  0 0 0 0 0  Tired, decreased energy 0 0 0 0 0  Change in appetite 0 0 0 0 0  Feeling bad or failure about yourself  0 0 0 0 0  Trouble concentrating 0 0 0 0 0  Moving slowly or fidgety/restless 0 0 0 0 0  Suicidal thoughts 0 0 0 0 0  PHQ-9 Score 0 0 0 0 0  Difficult doing work/chores Not difficult at all Not difficult at all Not difficult at all Not difficult at all Not difficult at all   PHQ-2/9 Result is negative.    Fall Risk: Fall Risk  09/22/2018 07/23/2018 07/03/2018 06/17/2018 08/13/2017  Falls in the past year? 0 0 0 0 No  Number falls in past yr: 0 0 0 0 -  Injury with Fall? 0 0 0 0 -  Follow up Falls evaluation completed - Falls evaluation completed - -    Assessment & Plan  1. Pre-op evaluation - Unable to perform full physical examination today, however she was seen in person by myself on 09/22/2018 and her physical examination at that time was unremarkable; she reports no changes to her health history and is overall considered low risk for the planned surgical intervention at this time. - CBC with Differential/Platelet - COMPLETE METABOLIC PANEL WITH GFR - POCT urinalysis dipstick - HIV Antibody (routine testing w rflx) - B-HCG Quant - INR/PT - PTT - Hepatitis C antibody - VITAMIN D 25 Hydroxy (Vit-D Deficiency, Fractures) - Lipid panel - RPR - EKG 12-Lead - DG Chest 2 View; Future  2. Vitamin D deficiency - VITAMIN D 25 Hydroxy (Vit-D Deficiency, Fractures)  3. H/O: hysterectomy - Will order HCG quant per request from surgeon's  office, though she is s/p hysterectomy.  4. Obesity due to excess calories, unspecified classification, unspecified whether serious comorbidity present - CBC with Differential/Platelet - COMPLETE METABOLIC PANEL WITH GFR - Lipid panel  5. Encounter for hepatitis C screening test for low risk patient - Hepatitis C antibody  6. Screening for HIV without presence of risk factors - HIV Antibody (routine testing w rflx)  7. Routine screening for STI (sexually transmitted infection) - HIV Antibody (routine testing w rflx) - RPR  8. Lipid screening - Lipid panel   I discussed the assessment and treatment plan with the patient. The patient was provided an opportunity to ask questions and all were answered. The patient agreed with the plan and demonstrated an understanding of the instructions.  The patient was advised to call back or seek an in-person evaluation if the symptoms worsen or if the condition fails to improve as anticipated.  I provided 12 minutes of non-face-to-face time during this encounter.

## 2018-11-19 LAB — COMPLETE METABOLIC PANEL WITH GFR
AG Ratio: 1.5 (calc) (ref 1.0–2.5)
ALT: 12 U/L (ref 6–29)
AST: 12 U/L (ref 10–30)
Albumin: 4.5 g/dL (ref 3.6–5.1)
Alkaline phosphatase (APISO): 98 U/L (ref 31–125)
BUN: 15 mg/dL (ref 7–25)
CO2: 25 mmol/L (ref 20–32)
Calcium: 9.9 mg/dL (ref 8.6–10.2)
Chloride: 107 mmol/L (ref 98–110)
Creat: 1 mg/dL (ref 0.50–1.10)
GFR, Est African American: 80 mL/min/{1.73_m2} (ref >=60)
GFR, Est Non African American: 69 mL/min/{1.73_m2} (ref >=60)
Globulin: 3 g/dL (calc) (ref 1.9–3.7)
Glucose, Bld: 86 mg/dL (ref 65–99)
Potassium: 4.5 mmol/L (ref 3.5–5.3)
Sodium: 141 mmol/L (ref 135–146)
Total Bilirubin: 0.5 mg/dL (ref 0.2–1.2)
Total Protein: 7.5 g/dL (ref 6.1–8.1)

## 2018-11-19 LAB — PROTIME-INR
INR: 1
Prothrombin Time: 10 s (ref 9.0–11.5)

## 2018-11-19 LAB — CBC WITH DIFFERENTIAL/PLATELET
Absolute Monocytes: 611 cells/uL (ref 200–950)
Basophils Absolute: 28 cells/uL (ref 0–200)
Basophils Relative: 0.4 %
Eosinophils Absolute: 320 cells/uL (ref 15–500)
Eosinophils Relative: 4.5 %
HCT: 42.7 % (ref 35.0–45.0)
Hemoglobin: 14 g/dL (ref 11.7–15.5)
Lymphs Abs: 1931 cells/uL (ref 850–3900)
MCH: 27.7 pg (ref 27.0–33.0)
MCHC: 32.8 g/dL (ref 32.0–36.0)
MCV: 84.4 fL (ref 80.0–100.0)
MPV: 11.3 fL (ref 7.5–12.5)
Monocytes Relative: 8.6 %
Neutro Abs: 4210 cells/uL (ref 1500–7800)
Neutrophils Relative %: 59.3 %
Platelets: 261 10*3/uL (ref 140–400)
RBC: 5.06 10*6/uL (ref 3.80–5.10)
RDW: 11.8 % (ref 11.0–15.0)
Total Lymphocyte: 27.2 %
WBC: 7.1 10*3/uL (ref 3.8–10.8)

## 2018-11-19 LAB — LIPID PANEL
Cholesterol: 170 mg/dL (ref <200)
HDL: 55 mg/dL (ref >=50)
LDL Cholesterol (Calc): 102 mg/dL (calc) — ABNORMAL HIGH
Non-HDL Cholesterol (Calc): 115 mg/dL (calc) (ref <130)
Total CHOL/HDL Ratio: 3.1 (calc) (ref <5.0)
Triglycerides: 52 mg/dL (ref <150)

## 2018-11-19 LAB — RPR: RPR Ser Ql: NONREACTIVE

## 2018-11-19 LAB — HIV ANTIBODY (ROUTINE TESTING W REFLEX): HIV 1&2 Ab, 4th Generation: NONREACTIVE

## 2018-11-19 LAB — HEPATITIS C ANTIBODY
Hepatitis C Ab: NONREACTIVE
SIGNAL TO CUT-OFF: 0.01 (ref <1.00)

## 2018-11-19 LAB — APTT: aPTT: 29 s (ref 22–34)

## 2018-11-19 LAB — VITAMIN D 25 HYDROXY (VIT D DEFICIENCY, FRACTURES): Vit D, 25-Hydroxy: 21 ng/mL — ABNORMAL LOW (ref 30–100)

## 2018-11-19 LAB — HCG, QUANTITATIVE, PREGNANCY: HCG, Total, QN: <3 m[IU]/mL

## 2018-12-03 ENCOUNTER — Telehealth: Payer: Self-pay | Admitting: Family Medicine

## 2018-12-03 NOTE — Telephone Encounter (Signed)
LVM informing patient that she can r/s her appointment that was on 12/07/2018 to sometime in November or December.

## 2018-12-03 NOTE — Telephone Encounter (Signed)
Pap is due in November; she can reschedule for November/December time to get her through her upcoming surgery, then come in to see me.

## 2018-12-03 NOTE — Telephone Encounter (Signed)
Copied from [place] [id number]. Topic: General - Other >> Dec 03, 2018  8:36 AM Keene Breath wrote: Reason for CRM: Patient called to verify that she still has to come in for a physical on 12/07/18.  Patient stated that she was just there about 2 weeks ago.  Please call her to discuss before she cancels the appt.  CB# [phone]

## 2018-12-07 ENCOUNTER — Encounter: Payer: BLUE CROSS/BLUE SHIELD | Admitting: Family Medicine

## 2019-01-10 HISTORY — PX: LIPOSUCTION: SHX10

## 2019-03-23 ENCOUNTER — Encounter: Payer: Self-pay | Admitting: Family Medicine

## 2019-03-24 ENCOUNTER — Other Ambulatory Visit: Payer: Self-pay | Admitting: Family Medicine

## 2019-03-24 DIAGNOSIS — E6609 Other obesity due to excess calories: Secondary | ICD-10-CM

## 2019-03-24 NOTE — Telephone Encounter (Signed)
Requested medication (s) are due for refill today: yes  Requested medication (s) are on the active medication list: yes  Last refill: 09/22/2019 #30 2 refills  Future visit scheduledy  es  Notes to clinic:not delegated  Requested Prescriptions  Pending Prescriptions Disp Refills   QSYMIA 7.5-46 MG CP24 [Pharmacy Med Name: QSYMIA 7.5-46MG  CAPSULES] 30 capsule     Sig: TAKE 1 CAPSULE BY MOUTH EVERY MORNING      Not Delegated - Neurology: Anticonvulsants - Controlled - phentermine / topiramate Failed - 03/24/2019  5:31 PM      Failed - This refill cannot be delegated      Passed - Cr in normal range and within 360 days    Creat  Date Value Ref Range Status  11/18/2018 1.00 0.50 - 1.10 mg/dL Final          Passed - CO2 in normal range and within 360 days    CO2  Date Value Ref Range Status  11/18/2018 25 20 - 32 mmol/L Final   Co2  Date Value Ref Range Status  02/10/2014 30 21 - 32 mmol/L Final          Passed - Valid encounter within last 12 months    Recent Outpatient Visits           4 months ago Pre-op evaluation   [place], FNP   6 months ago Obesity due to excess calories, unspecified classification, unspecified whether serious comorbidity present   [place], FNP   8 months ago Lumbar radiculopathy   [place], FNP   8 months ago Dermoid tumor   [place], FNP   9 months ago Dermoid tumor   [place], [place]       Future Appointments             In 5 days [facility], [facility], [facility]

## 2019-03-25 NOTE — Telephone Encounter (Signed)
Needs appt - can discuss 03/29/19 appt.

## 2019-03-29 ENCOUNTER — Encounter: Payer: Self-pay | Admitting: Family Medicine

## 2019-03-29 ENCOUNTER — Other Ambulatory Visit: Payer: Self-pay

## 2019-03-29 ENCOUNTER — Ambulatory Visit (INDEPENDENT_AMBULATORY_CARE_PROVIDER_SITE_OTHER): Payer: 59 | Admitting: Family Medicine

## 2019-03-29 VITALS — BP 124/80 | HR 76 | Temp 97.2°F | Resp 16 | Ht 69.0 in | Wt 209.7 lb

## 2019-03-29 DIAGNOSIS — R82998 Other abnormal findings in urine: Secondary | ICD-10-CM

## 2019-03-29 DIAGNOSIS — E6609 Other obesity due to excess calories: Secondary | ICD-10-CM | POA: Diagnosis not present

## 2019-03-29 LAB — POCT URINALYSIS DIPSTICK
Bilirubin, UA: NEGATIVE
Blood, UA: NEGATIVE
Glucose, UA: NEGATIVE
Ketones, UA: NEGATIVE
Leukocytes, UA: NEGATIVE
Nitrite, UA: NEGATIVE
Protein, UA: NEGATIVE
Spec Grav, UA: 1.015 (ref 1.010–1.025)
Urobilinogen, UA: NEGATIVE E.U./dL — AB
pH, UA: 5 (ref 5.0–8.0)

## 2019-03-29 MED ORDER — QSYMIA 3.75-23 MG PO CP24: 1.0000 | ORAL_CAPSULE | Freq: Every day | ORAL | 0 refills | Status: DC

## 2019-03-29 MED ORDER — QSYMIA 7.5-46 MG PO CP24: 1.0000 | ORAL_CAPSULE | ORAL | 2 refills | Status: DC

## 2019-03-29 NOTE — Progress Notes (Signed)
Name: Alexis Suarez   MRN: [id number]    DOB: [birth year]   Date:03/29/2019       Progress Note  Subjective  Chief Complaint  Chief Complaint  Patient presents with  . Dysuria    dark color urine    HPI  Dark Urine: Pt presents with concern for dark urine.  She had liposuction 01/06/2019 and did well with the surgery, went off her Qsymia prior to her surgery in October 2020, then restarted 2-3 weeks ago, then noticed that she was getting some dark urine, cloudiness in the urine and stopped the medication.  She also states that during this time she was doing a "fast" and questions if she was a bit dehydrated.  No dysuria, pelvic discomfort, fevers/chills, back pain, no palpitations, chest pain, or shortness of breath, NVD.  Patient Active Problem List   Diagnosis Date Noted  . 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 Vitamin D deficiency 06/17/2018  . H/O: hysterectomy 04/15/2015  . Constipation 01/07/2014    Social History   Tobacco Use  . Smoking status: Never Smoker  . Smokeless tobacco: Never Used  Substance Use Topics  . Alcohol use: Yes    Alcohol/week: 0.0 standard drinks    Comment: Rare     Current Outpatient Medications:  .  Phentermine-Topiramate (QSYMIA) 7.5-46 MG CP24, Take 1 tablet by mouth every morning., Disp: 30 capsule, Rfl: 2 .  spironolactone (ALDACTONE) 100 MG tablet, Take 50 mg by mouth daily., Disp: , Rfl:   No Known Allergies  I personally reviewed active problem list, medication list, allergies, notes from last encounter, lab results with the patient/caregiver today.  ROS  Ten systems reviewed and is negative except as mentioned in HPI  Objective  Vitals:   03/29/19 0752  BP: 124/80  Pulse: 76  Resp: 16  Temp: (!) 97.2 F (36.2 C)  TempSrc: Temporal  SpO2: 99%  Weight: 209 lb 11.2 oz (95.1 kg)  Height: 5\' 9"  (1.753 m)   Body mass index is 30.97 kg/m.  Nursing Note and Vital Signs reviewed.  Physical Exam  Constitutional: Patient  appears well-developed and well-nourished. No distress.  HENT: Head: Normocephalic and atraumatic.  Eyes: Conjunctivae and EOM are normal. No scleral icterus.   Neck: Normal range of motion. Neck supple. No JVD present.  Cardiovascular: Normal rate, regular rhythm and normal heart sounds.  No murmur heard. No BLE edema. Pulmonary/Chest: Effort normal and breath sounds normal. No respiratory distress. Abdominal: Soft. Bowel sounds are normal, no distension. There is no tenderness. No masses. No CVA tenderness.  Musculoskeletal: Normal range of motion, no joint effusions. No gross deformities Neurological: Pt is alert and oriented to person, place, and time. No cranial nerve deficit. Coordination, balance, strength, speech and gait are normal.  Skin: Skin is warm and dry. No rash noted. No erythema.  Psychiatric: Patient has a normal mood and affect. behavior is normal. Judgment and thought content normal.  Results for orders placed or performed in visit on 03/29/19 (from the past 72 hour(s))  POCT urinalysis dipstick     Status: Abnormal   Collection Time: 03/29/19  8:01 AM  Result Value Ref Range   Color, UA gold    Clarity, UA clear    Glucose, UA Negative Negative   Bilirubin, UA negative    Ketones, UA negative    Spec Grav, UA 1.015 1.010 - 1.025   Blood, UA negative    pH, UA 5.0 5.0 - 8.0   Protein,  UA Negative Negative   Urobilinogen, UA negative (A) 0.2 or 1.0 E.U./dL   Nitrite, UA negative    Leukocytes, UA Negative Negative   Appearance clear    Odor none     Assessment & Plan  1. Dark urine - Advised to stay well hydrated; urine appears WNL today, no concern for UTI or dehydration at this time.  Restart Qsymia at lower dose x14 days, then ramp up to 7.5-46mg . - POCT urinalysis dipstick  2. Obesity due to excess calories, unspecified classification, unspecified whether serious comorbidity present - Phentermine-Topiramate (QSYMIA) 7.5-46 MG CP24; Take 1 tablet by mouth  every morning.  Dispense: 30 capsule; Refill: 2 - Phentermine-Topiramate (QSYMIA) 3.75-23 MG CP24; Take 1 tablet by mouth daily.  Dispense: 14 capsule; Refill: 0  -Red flags and when to present for emergency care or RTC including fever >101.82F, chest pain, shortness of breath, new/worsening/un-resolving symptoms, reviewed with patient at time of visit. Follow up and care instructions discussed and provided in AVS.

## 2019-05-24 ENCOUNTER — Encounter: Payer: Self-pay | Admitting: Family Medicine

## 2019-06-09 ENCOUNTER — Other Ambulatory Visit: Payer: Self-pay

## 2019-06-09 ENCOUNTER — Encounter: Payer: Self-pay | Admitting: Family Medicine

## 2019-06-09 ENCOUNTER — Ambulatory Visit (INDEPENDENT_AMBULATORY_CARE_PROVIDER_SITE_OTHER): Payer: 59 | Admitting: Family Medicine

## 2019-06-09 VITALS — BP 116/68 | HR 70 | Temp 97.9°F | Resp 14 | Ht 69.0 in | Wt 213.3 lb

## 2019-06-09 DIAGNOSIS — R252 Cramp and spasm: Secondary | ICD-10-CM

## 2019-06-09 DIAGNOSIS — M25561 Pain in right knee: Secondary | ICD-10-CM

## 2019-06-09 DIAGNOSIS — E669 Obesity, unspecified: Secondary | ICD-10-CM

## 2019-06-09 DIAGNOSIS — M25562 Pain in left knee: Secondary | ICD-10-CM

## 2019-06-09 MED ORDER — MELOXICAM 15 MG PO TABS: 15.0000 mg | ORAL_TABLET | Freq: Every day | ORAL | 3 refills | Status: DC

## 2019-06-09 NOTE — Progress Notes (Signed)
Patient ID: Alexis Suarez, female    DOB: [birth year], 44 y.o.   MRN: [id number]  PCP: Hubbard Hartshorn, FNP  Chief Complaint  Patient presents with  . Knee Pain    bilateral  . Referral    possible ortho     Subjective:   Alexis Suarez is a 44 y.o. female, presents to clinic with CC of the following:  Bilateral knee pain  Right knee has been bothering her for a while - 2 weeks gradual onset, sore constant pain severity was moderate to severe causing a limp, worse with bending it, no worse with weight bearing and going up and down stairs.  No swelling, no popping or clicking.  It felt a little unstable when she was doing bootcamp and exercising, exercise did make pain worse She restarted boot camp and exercising was end of Feb and beginning of march   The other day her left knee popped and it has been hurting all around her knee on the left and she had some muscle cramps Left knee hurts to the popliteal fossa area, she had a charlie horse  Treated with ibuprofen and has been resting and it feels a little better.  Her dad has hx of OA and bilateral total knee replacements.  Patient Active Problem List   Diagnosis Date Noted  . 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 Vitamin D deficiency 06/17/2018  . H/O: hysterectomy 04/15/2015  . Constipation 01/07/2014      Current Outpatient Medications:  .  Phentermine-Topiramate (QSYMIA) 3.75-23 MG CP24, Take 1 tablet by mouth daily., Disp: 14 capsule, Rfl: 0 .  spironolactone (ALDACTONE) 100 MG tablet, Take 50 mg by mouth daily., Disp: , Rfl:  .  Phentermine-Topiramate (QSYMIA) 7.5-46 MG CP24, Take 1 tablet by mouth every morning., Disp: 30 capsule, Rfl: 2   No Known Allergies   Family History  Problem Relation Age of Onset  . Thyroid disease Mother   . Hypertension Sister   . Miscarriages / Stillbirths Sister   . Stroke Sister   . Diabetes Father   . Prostate cancer Paternal Grandfather   . Cancer Paternal Grandfather         Prostate     Social History   Socioeconomic History  . Marital status: Single    Spouse name: Not on file  . Number of children: 1  . Years of education: Not on file  . Highest education level: Not on file  Occupational History  . Not on file  Tobacco Use  . Smoking status: Never Smoker  . Smokeless tobacco: Never Used  Substance and Sexual Activity  . Alcohol use: Yes    Alcohol/week: 0.0 standard drinks    Comment: Rare  . Drug use: No  . Sexual activity: Yes    Partners: Male  Other Topics Concern  . Not on file  Social History Narrative   Lives in [place] with mother, son and boyfriend in a one story home. 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      Education: college degree and 1 year of grad school.      Diet - regular diet            Social Determinants of Health   Financial Resource Strain: Low Risk   . Difficulty of Paying Living Expenses: Not hard at all  Food Insecurity: No Food Insecurity  . Worried About Charity fundraiser in the Last Year: Never true  . Ran Out of  Food in the Last Year: Never true  Transportation Needs: No Transportation Needs  . Lack of Transportation (Medical): No  . Lack of Transportation (Non-Medical): No  Physical Activity: Sufficiently Active  . Days of Exercise per Week: 5 days  . Minutes of Exercise per Session: 50 min  Stress: No Stress Concern Present  . Feeling of Stress : Not at all  Social Connections: Slightly Isolated  . Frequency of Communication with Friends and Family: More than three times a week  . Frequency of Social Gatherings with Friends and Family: More than three times a week  . Attends Religious Services: More than 4 times per year  . Active Member of Clubs or Organizations: Yes  . Attends Archivist Meetings: More than 4 times per year  . Marital Status: Never married  Intimate Partner Violence: Not At Risk  . Fear of Current or Ex-Partner: No  . Emotionally Abused: No  . Physically Abused: No    . Sexually Abused: No    Chart Review Today: I personally reviewed active problem list, medication list, allergies, family history, social history, health maintenance, notes from last encounter, lab results, imaging with the patient/caregiver today.   Review of Systems 10 Systems reviewed and are negative for acute change except as noted in the HPI.     Objective:   Vitals:   06/09/19 1032  BP: 116/68  Pulse: 70  Resp: 14  Temp: 97.9 F (36.6 C)  Weight: 213 lb 4.8 oz (96.8 kg)  Height: 5\' 9"  (1.753 m)    Body mass index is 31.5 kg/m.  Physical Exam Vitals and nursing note reviewed.  Constitutional:      General: She is not in acute distress.    Appearance: Normal appearance. She is well-developed. She is not ill-appearing, toxic-appearing or diaphoretic.  HENT:     Head: Normocephalic and atraumatic.  Eyes:     General:        Right eye: No discharge.        Left eye: No discharge.     Conjunctiva/sclera: Conjunctivae normal.  Neck:     Trachea: No tracheal deviation.  Cardiovascular:     Rate and Rhythm: Normal rate.  Pulmonary:     Effort: Pulmonary effort is normal. No respiratory distress.     Breath sounds: No stridor.  Musculoskeletal:     Right knee: No swelling, deformity, effusion, erythema, bony tenderness or crepitus. Normal range of motion. No tenderness. No ACL laxity. Normal meniscus and normal patellar mobility. Normal pulse.     Instability Tests: Negative medial McMurray test and negative lateral McMurray test.     Left knee: No swelling, deformity, effusion, erythema, bony tenderness or crepitus. No tenderness. No ACL laxity.Normal meniscus and normal patellar mobility. Normal pulse.     Instability Tests: Negative medial McMurray test and negative lateral McMurray test.  Skin:    Coloration: Skin is not jaundiced or pale.     Findings: No rash.  Neurological:     Mental Status: She is alert.  Psychiatric:        Mood and Affect: Mood  normal.        Behavior: Behavior normal.      Results for orders placed or performed in visit on 03/29/19  POCT urinalysis dipstick  Result Value Ref Range   Color, UA gold    Clarity, UA clear    Glucose, UA Negative Negative   Bilirubin, UA negative    Ketones, UA negative  Spec Grav, UA 1.015 1.010 - 1.025   Blood, UA negative    pH, UA 5.0 5.0 - 8.0   Protein, UA Negative Negative   Urobilinogen, UA negative (A) 0.2 or 1.0 E.U./dL   Nitrite, UA negative    Leukocytes, UA Negative Negative   Appearance clear    Odor none         Assessment & Plan:      ICD-10-CM   1. Acute pain of both knees  M25.561 meloxicam (MOBIC) 15 MG tablet   M25.562    suspect combo or recent increased physical activity, some OA and mild obesity.  Encouraged RICE tx, low impact exercise, f/up if needed  2. Muscle cramps  R25.2    rare cramps and charlie horse type sx, encouraged her to push hydration, can try mag supplement, bananas, electrolyte replacement  3. Obesity (BMI 30.0-34.9)  E66.9     Reviewed rice tx, icing, suggested getting knee sleeve braces, good technique with working out.  Currently to swelling, instability, crepitus.  B/L knee compliants seem generalized, likely triggered by starting back at boot camp and running after months of a break due to weather and COVID - encouraged her to start back gradually.  F/up if she would like imaging or referral.  She declined ortho referral today     Delsa Grana, PA-C 06/09/19 11:00 AM

## 2019-06-09 NOTE — Patient Instructions (Addendum)
RICE Therapy for Routine Care of Injuries Many injuries can be cared for with rest, ice, compression, and elevation (RICE therapy). This includes:  Resting the injured part.  Putting ice on the injury.  Putting pressure (compression) on the injury.  Raising the injured part (elevation). Using RICE therapy can help to lessen pain and swelling. Supplies needed:  Ice.  Plastic bag.  Towel.  Elastic bandage.  Pillow or pillows to raise (elevate) your injured body part. How to care for your injury with RICE therapy Rest Limit your normal activities, and try not to use the injured part of your body. You can go back to your normal activities when your doctor says it is okay to do them and you feel okay. Ask your doctor if you should do exercises to help your injury get better. Ice Put ice on the injured area. Do not put ice on your bare skin.  Put ice in a plastic bag.  Place a towel between your skin and the bag.  Leave the ice on for 20 minutes, 2-3 times a day. Use ice on as many days as told by your doctor.  Compression Compression means putting pressure on the injured area. This can be done with an elastic bandage. If an elastic bandage has been put on your injury:  Do not wrap the bandage too tight. Wrap the bandage more loosely if part of your body away from the bandage is blue, swollen, cold, painful, or loses feeling (gets numb).  Take off the bandage and put it on again. Do this every 3-4 hours or as told by your doctor.  See your doctor if the bandage seems to make your problems worse.  Elevation Elevation means keeping the injured area raised. If you can, raise the injured area above your heart or the center of your chest. Contact a doctor if:  You keep having pain and swelling.  Your symptoms get worse. Get help right away if:  You have sudden bad pain at your injury or lower than your injury.  You have redness or more swelling around your injury.  You  have tingling or numbness at your injury or lower than your injury, and it does not go away when you take off the bandage. Summary  Many injuries can be cared for using rest, ice, compression, and elevation (RICE therapy).  You can go back to your normal activities when you feel okay and your doctor says it is okay.  Put ice on the injured area as told by your doctor.  Get help if your symptoms get worse or if you keep having pain and swelling. This information is not intended to replace advice given to you by your health care provider. Make sure you discuss any questions you have with your health care provider. Document Revised: 11/08/2016 Document Reviewed: 11/08/2016 Elsevier Patient Education  [employer].   Acute Knee Pain, Adult Many things can cause knee pain. Sometimes, knee pain is sudden (acute) and may be caused by damage, swelling, or irritation of the muscles and tissues that support your knee. The pain often goes away on its own with time and rest. If the pain does not go away, tests may be done to find out what is causing the pain. Follow these instructions at home: Pay attention to any changes in your symptoms. Take these actions to relieve your pain. If you have a knee sleeve or brace:   Wear the sleeve or brace as told by your doctor.  Remove it only as told by your doctor.  Loosen the sleeve or brace if your toes: ? Tingle. ? Become numb. ? Turn cold and blue.  Keep the sleeve or brace clean.  If the sleeve or brace is not waterproof: ? Do not let it get wet. ? Cover it with a watertight covering when you take a bath or shower. Activity  Rest your knee.  Do not do things that cause pain.  Avoid activities where both feet leave the ground at the same time (high-impact activities). Examples are running, jumping rope, and doing jumping jacks.  Work with a physical therapist to make a safe exercise program, as told by your doctor. Managing pain,  stiffness, and swelling   If told, put ice on the knee: ? Put ice in a plastic bag. ? Place a towel between your skin and the bag. ? Leave the ice on for 20 minutes, 2-3 times a day.  If told, put pressure (compression) on your injured knee to control swelling, give support, and help with discomfort. Compression may be done with an elastic bandage. General instructions  Take all medicines only as told by your doctor.  Raise (elevate) your knee while you are sitting or lying down. Make sure your knee is higher than your heart.  Sleep with a pillow under your knee.  Do not use any products that contain nicotine or tobacco. These include cigarettes, e-cigarettes, and chewing tobacco. These products may slow down healing. If you need help quitting, ask your doctor.  If you are overweight, work with your doctor and a food expert (dietitian) to set goals to lose weight. Being overweight can make your knee hurt more.  Keep all follow-up visits as told by your doctor. This is important. Contact a doctor if:  The knee pain does not stop.  The knee pain changes or gets worse.  You have a fever along with knee pain.  Your knee feels warm when you touch it.  Your knee gives out or locks up. Get help right away if:  Your knee swells, and the swelling gets worse.  You cannot move your knee.  You have very bad knee pain. Summary  Many things can cause knee pain. The pain often goes away on its own with time and rest.  Your doctor may do tests to find out the cause of the pain.  Pay attention to any changes in your symptoms. Relieve your pain with rest, medicines, light activity, and use of ice.  Get help right away if you cannot move your knee or your knee pain is very bad. This information is not intended to replace advice given to you by your health care provider. Make sure you discuss any questions you have with your health care provider. Document Revised: 07/31/2017 Document  Reviewed: 07/31/2017 Elsevier Patient Education  2020 [employer]. Osteoarthritis  Osteoarthritis is a type of arthritis that affects tissue that covers the ends of bones in joints (cartilage). Cartilage acts as a cushion between the bones and helps them move smoothly. Osteoarthritis results when cartilage in the joints gets worn down. Osteoarthritis is sometimes called "wear and tear" arthritis. Osteoarthritis is the most common form of arthritis. It often occurs in older people. It is a condition that gets worse over time (a progressive condition). Joints that are most often affected by this condition are in:  Fingers.  Toes.  Hips.  Knees.  Spine, including neck and lower back. What are the causes? This condition  is caused by age-related wearing down of cartilage that covers the ends of bones. What increases the risk? The following factors may make you more likely to develop this condition:  Older age.  Being overweight or obese.  Overuse of joints, such as in athletes.  Past injury of a joint.  Past surgery on a joint.  Family history of osteoarthritis. What are the signs or symptoms? The main symptoms of this condition are pain, swelling, and stiffness in the joint. The joint may lose its shape over time. Small pieces of bone or cartilage may break off and float inside of the joint, which may cause more pain and damage to the joint. Small deposits of bone (osteophytes) may grow on the edges of the joint. Other symptoms may include:  A grating or scraping feeling inside the joint when you move it.  Popping or creaking sounds when you move. Symptoms may affect one or more joints. Osteoarthritis in a major joint, such as your knee or hip, can make it painful to walk or exercise. If you have osteoarthritis in your hands, you might not be able to grip items, twist your hand, or control small movements of your hands and fingers (fine motor skills). How is this diagnosed? This  condition may be diagnosed based on:  Your medical history.  A physical exam.  Your symptoms.  X-rays of the affected joint(s).  Blood tests to rule out other types of arthritis. How is this treated? There is no cure for this condition, but treatment can help to control pain and improve joint function. Treatment plans may include:  A prescribed exercise program that allows for rest and joint relief. You may work with a physical therapist.  A weight control plan.  Pain relief techniques, such as: ? Applying heat and cold to the joint. ? Electric pulses delivered to nerve endings under the skin (transcutaneous electrical nerve stimulation, or TENS). ? Massage. ? Certain nutritional supplements.  NSAIDs or prescription medicines to help relieve pain.  Medicine to help relieve pain and inflammation (corticosteroids). This can be given by mouth (orally) or as an injection.  Assistive devices, such as a brace, wrap, splint, specialized glove, or cane.  Surgery, such as: ? An osteotomy. This is done to reposition the bones and relieve pain or to remove loose pieces of bone and cartilage. ? Joint replacement surgery. You may need this surgery if you have very bad (advanced) osteoarthritis. Follow these instructions at home: Activity  Rest your affected joints as directed by your health care provider.  Do not drive or use heavy machinery while taking prescription pain medicine.  Exercise as directed. Your health care provider or physical therapist may recommend specific types of exercise, such as: ? Strengthening exercises. These are done to strengthen the muscles that support joints that are affected by arthritis. They can be performed with weights or with exercise bands to add resistance. ? Aerobic activities. These are exercises, such as brisk walking or water aerobics, that get your heart pumping. ? Range-of-motion activities. These keep your joints easy to move. ? Balance and  agility exercises. Managing pain, stiffness, and swelling      If directed, apply heat to the affected area as often as told by your health care provider. Use the heat source that your health care provider recommends, such as a moist heat pack or a heating pad. ? If you have a removable assistive device, remove it as told by your health care provider. ? Place  a towel between your skin and the heat source. If your health care provider tells you to keep the assistive device on while you apply heat, place a towel between the assistive device and the heat source. ? Leave the heat on for 20-30 minutes. ? Remove the heat if your skin turns bright red. This is especially important if you are unable to feel pain, heat, or cold. You may have a greater risk of getting burned.  If directed, put ice on the affected joint: ? If you have a removable assistive device, remove it as told by your health care provider. ? Put ice in a plastic bag. ? Place a towel between your skin and the bag. If your health care provider tells you to keep the assistive device on during icing, place a towel between the assistive device and the bag. ? Leave the ice on for 20 minutes, 2-3 times a day. General instructions  Take over-the-counter and prescription medicines only as told by your health care provider.  Maintain a healthy weight. Follow instructions from your health care provider for weight control. These may include dietary restrictions.  Do not use any products that contain nicotine or tobacco, such as cigarettes and e-cigarettes. These can delay bone healing. If you need help quitting, ask your health care provider.  Use assistive devices as directed by your health care provider.  Keep all follow-up visits as told by your health care provider. This is important. Where to find more information  [employer] of Arthritis and Musculoskeletal and Skin Diseases: www.niams.SouthExposed.es  [employer] on  Aging: http://kim-miller.com/  American College of Rheumatology: www.rheumatology.org Contact a health care provider if:  Your skin turns red.  You develop a rash.  You have pain that gets worse.  You have a fever along with joint or muscle aches. Get help right away if:  You lose a lot of weight.  You suddenly lose your appetite.  You have night sweats. Summary  Osteoarthritis is a type of arthritis that affects tissue covering the ends of bones in joints (cartilage).  This condition is caused by age-related wearing down of cartilage that covers the ends of bones.  The main symptom of this condition is pain, swelling, and stiffness in the joint.  There is no cure for this condition, but treatment can help to control pain and improve joint function. This information is not intended to replace advice given to you by your health care provider. Make sure you discuss any questions you have with your health care provider. Document Revised: 01/31/2017 Document Reviewed: 10/23/2015 Elsevier Patient Education  2020 [employer].

## 2019-06-14 ENCOUNTER — Other Ambulatory Visit: Payer: Self-pay | Admitting: Family Medicine

## 2019-06-14 DIAGNOSIS — Z1231 Encounter for screening mammogram for malignant neoplasm of breast: Secondary | ICD-10-CM

## 2019-06-20 ENCOUNTER — Encounter: Payer: Self-pay | Admitting: Family Medicine

## 2019-07-06 ENCOUNTER — Other Ambulatory Visit: Payer: Self-pay

## 2019-07-06 ENCOUNTER — Ambulatory Visit
Admission: RE | Admit: 2019-07-06 | Discharge: 2019-07-06 | Disposition: A | Discharge status: Home / Self Care | Payer: 59 | Source: Ambulatory Visit | Attending: Family Medicine | Admitting: Family Medicine

## 2019-07-06 DIAGNOSIS — Z1231 Encounter for screening mammogram for malignant neoplasm of breast: Secondary | ICD-10-CM

## 2019-10-11 ENCOUNTER — Encounter: Payer: Self-pay | Admitting: Family Medicine

## 2019-10-12 NOTE — Telephone Encounter (Signed)
And I cannot order without info from her surgeon - and the time it takes to review all the paper work and do orders needs to go with her appt - sorry there is no other time available to do it separate from an appointment

## 2019-10-12 NOTE — Telephone Encounter (Signed)
Pt needs appt for only the preop assessment and labs (cannot do with other routine OV)

## 2019-10-21 ENCOUNTER — Telehealth: Payer: Self-pay

## 2019-10-21 ENCOUNTER — Encounter: Payer: Self-pay | Admitting: Internal Medicine

## 2019-10-21 ENCOUNTER — Other Ambulatory Visit: Payer: Self-pay

## 2019-10-21 ENCOUNTER — Ambulatory Visit: Payer: 59 | Admitting: Internal Medicine

## 2019-10-21 ENCOUNTER — Ambulatory Visit (INDEPENDENT_AMBULATORY_CARE_PROVIDER_SITE_OTHER)
Admission: RE | Admit: 2019-10-21 | Discharge: 2019-10-21 | Disposition: A | Discharge status: Home / Self Care | Payer: 59 | Source: Ambulatory Visit | Attending: Internal Medicine | Admitting: Internal Medicine

## 2019-10-21 VITALS — BP 116/74 | HR 60 | Temp 97.7°F | Wt 214.0 lb

## 2019-10-21 DIAGNOSIS — Z6831 Body mass index (BMI) 31.0-31.9, adult: Secondary | ICD-10-CM | POA: Diagnosis not present

## 2019-10-21 DIAGNOSIS — E6609 Other obesity due to excess calories: Secondary | ICD-10-CM | POA: Diagnosis not present

## 2019-10-21 DIAGNOSIS — Z01818 Encounter for other preprocedural examination: Secondary | ICD-10-CM | POA: Diagnosis not present

## 2019-10-21 LAB — CBC WITH DIFFERENTIAL/PLATELET
Basophils Absolute: 0 10*3/uL (ref 0.0–0.1)
Basophils Relative: 0.5 % (ref 0.0–3.0)
Eosinophils Absolute: 0.4 10*3/uL (ref 0.0–0.7)
Eosinophils Relative: 4.9 % (ref 0.0–5.0)
HCT: 38.9 % (ref 36.0–46.0)
Hemoglobin: 12.8 g/dL (ref 12.0–15.0)
Lymphocytes Relative: 26.2 % (ref 12.0–46.0)
Lymphs Abs: 2.2 10*3/uL (ref 0.7–4.0)
MCHC: 32.8 g/dL (ref 30.0–36.0)
MCV: 83.5 fl (ref 78.0–100.0)
Monocytes Absolute: 0.6 10*3/uL (ref 0.1–1.0)
Monocytes Relative: 7.2 % (ref 3.0–12.0)
Neutro Abs: 5.2 10*3/uL (ref 1.4–7.7)
Neutrophils Relative %: 61.2 % (ref 43.0–77.0)
Platelets: 240 10*3/uL (ref 150.0–400.0)
RBC: 4.66 Mil/uL (ref 3.87–5.11)
RDW: 13.2 % (ref 11.5–15.5)
WBC: 8.6 10*3/uL (ref 4.0–10.5)

## 2019-10-21 LAB — COMPREHENSIVE METABOLIC PANEL
ALT: 13 U/L (ref 0–35)
AST: 13 U/L (ref 0–37)
Albumin: 4.1 g/dL (ref 3.5–5.2)
Alkaline Phosphatase: 85 U/L (ref 39–117)
BUN: 14 mg/dL (ref 6–23)
CO2: 26 mEq/L (ref 19–32)
Calcium: 9.5 mg/dL (ref 8.4–10.5)
Chloride: 104 mEq/L (ref 96–112)
Creatinine, Ser: 0.96 mg/dL (ref 0.40–1.20)
GFR: 76.64 mL/min (ref >60.00)
Glucose, Bld: 83 mg/dL (ref 70–99)
Potassium: 4 mEq/L (ref 3.5–5.1)
Sodium: 136 mEq/L (ref 135–145)
Total Bilirubin: 0.7 mg/dL (ref 0.2–1.2)
Total Protein: 7 g/dL (ref 6.0–8.3)

## 2019-10-21 LAB — POC URINALSYSI DIPSTICK (AUTOMATED)
Bilirubin, UA: NEGATIVE
Blood, UA: NEGATIVE
Glucose, UA: NEGATIVE
Ketones, UA: NEGATIVE
Leukocytes, UA: NEGATIVE
Nitrite, UA: NEGATIVE
Protein, UA: POSITIVE — AB
Spec Grav, UA: 1.015 (ref 1.010–1.025)
Urobilinogen, UA: 0.2 E.U./dL
pH, UA: 7.5 (ref 5.0–8.0)

## 2019-10-21 LAB — PROTIME-INR
INR: 1 ratio (ref 0.8–1.0)
Prothrombin Time: 11.6 s (ref 9.6–13.1)

## 2019-10-21 LAB — HEMOGLOBIN A1C: Hgb A1c MFr Bld: 5.5 % (ref 4.6–6.5)

## 2019-10-21 LAB — HCG, QUANTITATIVE, PREGNANCY: Quantitative HCG: <0.6 m[IU]/mL

## 2019-10-21 LAB — APTT: aPTT: 30.3 s (ref 23.4–32.7)

## 2019-10-21 NOTE — Progress Notes (Signed)
Subjective:    Patient ID: Alexis Suarez, female    DOB: [birth year], 44 y.o.   MRN: [id number]  HPI  Pt presents to the clinic today for surgical clearance. She plans to have liposuction by Dr. Jannifer Franklin on October 8th.  Review of Systems      Past Medical History:  Diagnosis Date  . Asthma    ED eval in the past  . Sciatica     Current Outpatient Medications  Medication Sig Dispense Refill  . meloxicam (MOBIC) 15 MG tablet Take 1 tablet (15 mg total) by mouth daily. 30 tablet 3  . Phentermine-Topiramate (QSYMIA) 3.75-23 MG CP24 Take 1 tablet by mouth daily. 14 capsule 0  . Phentermine-Topiramate (QSYMIA) 7.5-46 MG CP24 Take 1 tablet by mouth every morning. 30 capsule 2  . spironolactone (ALDACTONE) 100 MG tablet Take 50 mg by mouth daily.     No current facility-administered medications for this visit.    No Known Allergies  Family History  Problem Relation Age of Onset  . Thyroid disease Mother   . Hypertension Sister   . Miscarriages / Stillbirths Sister   . Stroke Sister   . Diabetes Father   . Prostate cancer Paternal Grandfather   . Cancer Paternal Grandfather        Prostate    Social History   Socioeconomic History  . Marital status: Single    Spouse name: Not on file  . Number of children: 1  . Years of education: Not on file  . Highest education level: Not on file  Occupational History  . Not on file  Tobacco Use  . Smoking status: Never Smoker  . Smokeless tobacco: Never Used  Vaping Use  . Vaping Use: Never used  Substance and Sexual Activity  . Alcohol use: Yes    Alcohol/week: 0.0 standard drinks    Comment: Rare  . Drug use: No  . Sexual activity: Yes    Partners: Male  Other Topics Concern  . Not on file  Social History Narrative   Lives in [place] with mother, son and boyfriend in a one story home. 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      Education: college degree and 1 year of grad school.      Diet - regular diet             Social Determinants of Health   Financial Resource Strain:   . Difficulty of Paying Living Expenses: Not on file  Food Insecurity:   . Worried About Charity fundraiser in the Last Year: Not on file  . Ran Out of Food in the Last Year: Not on file  Transportation Needs:   . Lack of Transportation (Medical): Not on file  . Lack of Transportation (Non-Medical): Not on file  Physical Activity:   . Days of Exercise per Week: Not on file  . Minutes of Exercise per Session: Not on file  Stress:   . Feeling of Stress : Not on file  Social Connections:   . Frequency of Communication with Friends and Family: Not on file  . Frequency of Social Gatherings with Friends and Family: Not on file  . Attends Religious Services: Not on file  . Active Member of Clubs or Organizations: Not on file  . Attends Archivist Meetings: Not on file  . Marital Status: Not on file  Intimate Partner Violence:   . Fear of Current or Ex-Partner: Not on file  . Emotionally  Abused: Not on file  . Physically Abused: Not on file  . Sexually Abused: Not on file     Constitutional: Denies fever, malaise, fatigue, headache or abrupt weight changes.  Respiratory: Denies difficulty breathing, shortness of breath, cough or sputum production.   Cardiovascular: Denies chest pain, chest tightness, palpitations or swelling in the hands or feet.  Gastrointestinal: Denies abdominal pain, bloating, constipation, diarrhea or blood in the stool.  GU: Denies urgency, frequency, pain with urination, burning sensation, blood in urine, odor or discharge. Skin: Denies redness, rashes, lesions or ulcercations.  Neurological: Denies dizziness, difficulty with memory, difficulty with speech or problems with balance and coordination.   No other specific complaints in a complete review of systems (except as listed in HPI above).  Objective:   Physical Exam  LMP 04/09/2012   Wt Readings from Last 3 Encounters:   06/09/19 213 lb 4.8 oz (96.8 kg)  03/29/19 209 lb 11.2 oz (95.1 kg)  11/18/18 210 lb 1.6 oz (95.3 kg)    General: Appears her stated age, obese, in NAD. Skin: Warm, dry and intact. No rashes, lesions or ulcerations noted. HEENT: Head: normal shape and size; Eyes: sclera white, no icterus, conjunctiva pink, PERRLA and EOMs intact;  Cardiovascular: Normal rate and rhythm. S1,S2 noted.  No murmur, rubs or gallops noted. Pulmonary/Chest: Normal effort and positive vesicular breath sounds. No respiratory distress. No wheezes, rales or ronchi noted.  Musculoskeletal: No difficulty with gait.  Neurological: Alert and oriented.  Coordination normal.    BMET    Component Value Date/Time   NA 141 11/18/2018 0000   NA 139 02/10/2014 0516   K 4.5 11/18/2018 0000   K 3.8 02/10/2014 0516   CL 107 11/18/2018 0000   CL 103 02/10/2014 0516   CO2 25 11/18/2018 0000   CO2 30 02/10/2014 0516   GLUCOSE 86 11/18/2018 0000   GLUCOSE 76 02/10/2014 0516   BUN 15 11/18/2018 0000   BUN 5 (L) 02/10/2014 0516   CREATININE 1.00 11/18/2018 0000   CALCIUM 9.9 11/18/2018 0000   CALCIUM 7.9 (L) 02/10/2014 0516   GFRNONAA 69 11/18/2018 0000   GFRAA 80 11/18/2018 0000    Lipid Panel     Component Value Date/Time   CHOL 170 11/18/2018 0000   TRIG 52 11/18/2018 0000   HDL 55 11/18/2018 0000   CHOLHDL 3.1 11/18/2018 0000   VLDL 10.0 09/24/2017 1539   LDLCALC 102 (H) 11/18/2018 0000    CBC    Component Value Date/Time   WBC 7.1 11/18/2018 0000   RBC 5.06 11/18/2018 0000   HGB 14.0 11/18/2018 0000   HGB 8.4 (L) 02/10/2014 1750   HCT 42.7 11/18/2018 0000   HCT 25.0 (L) 02/10/2014 0516   PLT 261 11/18/2018 0000   PLT 125 (L) 02/10/2014 0516   MCV 84.4 11/18/2018 0000   MCV 87 02/10/2014 0516   MCH 27.7 11/18/2018 0000   MCHC 32.8 11/18/2018 0000   RDW 11.8 11/18/2018 0000   RDW 14.4 02/10/2014 0516   LYMPHSABS 1,931 11/18/2018 0000   LYMPHSABS 1.6 02/10/2014 0516   MONOABS 0.9 02/10/2014  0516   EOSABS 320 11/18/2018 0000   EOSABS 0.2 02/10/2014 0516   BASOSABS 28 11/18/2018 0000   BASOSABS 0.0 02/10/2014 0516    Hgb A1C Lab Results  Component Value Date   HGBA1C 5.4 06/18/2016           Assessment & Plan:   Preoperative Clearance:  Will check CBC with diff, CMET,  A1C, PT/INR, ptt, HIV, serum HcG and urinalysis Will fill out surgical clearance form once labs are back  Will follow up after labs  Webb Silversmith, NP This visit occurred during the SARS-CoV-2 public health emergency.  Safety protocols were in place, including screening questions prior to the visit, additional usage of staff PPE, and extensive cleaning of exam room while observing appropriate contact time as indicated for disinfecting solutions.

## 2019-10-21 NOTE — Patient Instructions (Signed)
Liposuction, Care After This sheet gives you information about how to care for yourself after your procedure. Your health care provider may also give you more specific instructions. If you have problems or questions, contact your health care provider. What can I expect after the procedure? After the procedure, it is common to have:  Swelling.  Bruising.  Pain.  Numbness.  Fluid leaking from incisions. Follow these instructions at home: Medicines  Take over-the-counter and prescription medicines only as told by your health care provider.  Do not drive or use heavy machinery while taking prescription pain medicine.  Do not take any new medicines unless you talk with your health care provider about them first. Ask your health care provider if they are safe for you. Eating and drinking If you were prescribed pain medicine, take steps to prevent or treat constipation. Your health care provider may recommend that you:  Drink enough fluid to keep your urine pale yellow.  Take over-the-counter or prescription medicines.  Eat foods that are high in fiber, such as beans, whole grains, and fresh fruits and vegetables.  Limit foods that are high in fat and processed sugars, such as fried and sweet foods. Incision care Follow instructions from your health care provider about how to take care of your incisions. Make sure you:  Wash your hands with soap and water before you change your compression garment or bandages (dressings). If soap and water are not available, use hand sanitizer.  Change your compression garment or dressings as told by your health care provider.  Leave stitches (sutures), skin glue, or adhesive strips in place. These skin closures may need to stay in place for 2 weeks or longer. If adhesive strip edges start to loosen and curl up, you may trim the loose edges. Do not remove adhesive strips completely unless your health care provider tells you to do that. Check your  incisions every day for signs of infection. Check for:  Redness, swelling, or pain.  Fluid or blood.  Warmth.  Pus or a bad smell. Do not take baths, swim, or use a hot tub until your health care provider approves. Ask your health care provider if you may take showers. You may only be allowed to take sponge baths. Activity  Gradually return to your regular activities. You should be able to do most activities in about 2 weeks.  For 4 weeks after your procedure, avoid activities that require a lot of effort.  Do not lift anything that is heavier than 10 lb (4.5 kg), or the limit that you are told, until your health care provider says that it is safe. General instructions  Stay out of direct sunlight. Your skin is more sensitive to the sun after surgery. Use sunblock and wear a wide-brimmed hat to protect your skin.  Maintain a healthy weight and eat a healthy diet. This helps the results of your liposuction last longer.  Wear a compression garment as instructed by your surgeon.  Keep all follow-up visits as told by your health care provider. This is important. Contact a health care provider if:  Your pain does not improve or gets worse.  You have more redness, swelling, numbness, or pain around an incision.  You have fluid or blood coming from an incision for longer than your health care provider told you to expect.  An incision feels warm to the touch.  You have pus or a bad smell coming from an incision.  You have a fever. Summary  After  your procedure, it is common to have pain, bruising, swelling, numbness, and fluid from the incisions.  Follow your health care provider's instructions. Aftercare is very important after liposuction to ensure the best result.  You will be told how to take your medicines, care for your incisions, recognize an infection, bathe, and wear a compression garment.  Contact your health care provider if your pain does not stop or gets worse, you  have more redness or blood coming from your incision sites, or you have a fever. This information is not intended to replace advice given to you by your health care provider. Make sure you discuss any questions you have with your health care provider. Document Revised: 07/09/2017 Document Reviewed: 07/09/2017 Elsevier Patient Education  [place].

## 2019-10-21 NOTE — Telephone Encounter (Signed)
Pt left v/m that my chart is not working and pt request cb about lab results. Pt said that the urinalysis protein was positive; Threasa Beards said that the positive protein does not mean anything and if there was a problem Threasa Beards CMA would call pt. Pt said she just wanted to verify about the positive protein and pt was going to contact My chart about why my chart is down. Nothing further needed at this time.

## 2019-10-22 LAB — HIV ANTIBODY (ROUTINE TESTING W REFLEX): HIV 1&2 Ab, 4th Generation: NONREACTIVE

## 2019-10-28 ENCOUNTER — Encounter: Payer: Self-pay | Admitting: Internal Medicine

## 2019-11-22 ENCOUNTER — Ambulatory Visit: Payer: 59 | Admitting: Family Medicine

## 2019-12-23 ENCOUNTER — Encounter: Payer: Self-pay | Admitting: Internal Medicine

## 2019-12-28 ENCOUNTER — Encounter: Payer: 59 | Admitting: Internal Medicine

## 2020-02-13 ENCOUNTER — Encounter: Payer: Self-pay | Admitting: Internal Medicine

## 2020-02-14 ENCOUNTER — Encounter: Payer: Self-pay | Admitting: Family Medicine

## 2020-02-14 ENCOUNTER — Telehealth: Payer: Self-pay | Admitting: Internal Medicine

## 2020-02-14 NOTE — Telephone Encounter (Signed)
Patient tested positive for covid. Would like to discuss infusion.EM

## 2020-02-14 NOTE — Telephone Encounter (Signed)
The referral has already been placed, pt needs to r/s CPE

## 2020-02-14 NOTE — Telephone Encounter (Signed)
Can you set up for monocolonal antibody infusion

## 2020-02-14 NOTE — Telephone Encounter (Signed)
Patient added to referrals list. See my other message regarding staff message.

## 2020-02-15 ENCOUNTER — Encounter: Payer: Self-pay | Admitting: Physician Assistant

## 2020-02-15 ENCOUNTER — Encounter: Payer: Self-pay | Admitting: Internal Medicine

## 2020-02-15 ENCOUNTER — Encounter: Payer: 59 | Admitting: Internal Medicine

## 2020-02-15 ENCOUNTER — Other Ambulatory Visit (HOSPITAL_COMMUNITY): Payer: Self-pay | Admitting: Physician Assistant

## 2020-02-15 NOTE — Progress Notes (Signed)
I connected by phone with Alexis Suarez on 02/15/2020 at 4:46 PM to discuss the potential use of a new treatment for mild to moderate COVID-19 viral infection in non-hospitalized patients.  This patient is a 44 y.o. female that meets the FDA criteria for Emergency Use Authorization of COVID monoclonal antibody casirivimab/imdevimab, bamlanivimab/etesevimab, or sotrovimab.  Has a (+) direct SARS-CoV-2 viral test result  Has mild or moderate COVID-19   Is NOT hospitalized due to COVID-19  Is within 10 days of symptom onset  Has at least one of the high risk factor(s) for progression to severe COVID-19 and/or hospitalization as defined in EUA.  Specific high risk criteria : BMI > 25   I have spoken and communicated the following to the patient or parent/caregiver regarding COVID monoclonal antibody treatment:  1. FDA has authorized the emergency use for the treatment of mild to moderate COVID-19 in adults and pediatric patients with positive results of direct SARS-CoV-2 viral testing who are 17 years of age and older weighing at least 40 kg, and who are at high risk for progressing to severe COVID-19 and/or hospitalization.  2. The significant known and potential risks and benefits of COVID monoclonal antibody, and the extent to which such potential risks and benefits are unknown.  3. Information on available alternative treatments and the risks and benefits of those alternatives, including clinical trials.  4. Patients treated with COVID monoclonal antibody should continue to self-isolate and use infection control measures (e.g., wear mask, isolate, social distance, avoid sharing personal items, clean and disinfect "high touch" surfaces, and frequent handwashing) according to CDC guidelines.   5. The patient or parent/caregiver has the option to accept or refuse COVID monoclonal antibody treatment.  After reviewing this information with the patient, the patient has agreed to receive one  of the available covid 19 monoclonal antibodies and will be provided an appropriate fact sheet prior to infusion. Konrad Felix, PA-C 02/15/2020 4:46 PM

## 2020-02-16 ENCOUNTER — Ambulatory Visit (HOSPITAL_COMMUNITY)
Admission: RE | Admit: 2020-02-16 | Discharge: 2020-02-16 | Disposition: A | Discharge status: Home / Self Care | Payer: 59 | Source: Ambulatory Visit | Attending: Pulmonary Disease | Admitting: Pulmonary Disease

## 2020-02-16 DIAGNOSIS — U071 COVID-19: Secondary | ICD-10-CM | POA: Insufficient documentation

## 2020-02-16 MED ORDER — DIPHENHYDRAMINE HCL 50 MG/ML IJ SOLN: 50.0000 mg | Freq: Once | INTRAMUSCULAR | Status: DC | PRN

## 2020-02-16 MED ORDER — EPINEPHRINE 0.3 MG/0.3ML IJ SOAJ: 0.3000 mg | Freq: Once | INTRAMUSCULAR | Status: DC | PRN

## 2020-02-16 MED ORDER — METHYLPREDNISOLONE SODIUM SUCC 125 MG IJ SOLR: 125.0000 mg | Freq: Once | INTRAMUSCULAR | Status: DC | PRN

## 2020-02-16 MED ORDER — ALBUTEROL SULFATE HFA 108 (90 BASE) MCG/ACT IN AERS: 2.0000 | INHALATION_SPRAY | Freq: Once | RESPIRATORY_TRACT | Status: DC | PRN

## 2020-02-16 MED ORDER — SODIUM CHLORIDE 0.9 % IV SOLN: INTRAVENOUS | Status: DC | PRN

## 2020-02-16 MED ORDER — SODIUM CHLORIDE 0.9 % IV SOLN: Freq: Once | INTRAVENOUS | Status: AC

## 2020-02-16 MED ORDER — FAMOTIDINE IN NACL 20-0.9 MG/50ML-% IV SOLN: 20.0000 mg | Freq: Once | INTRAVENOUS | Status: DC | PRN

## 2020-02-16 NOTE — Discharge Instructions (Signed)
10 Things You Can Do to Manage Your COVID-19 Symptoms at Home If you have possible or confirmed COVID-19: 1. Stay home from work and school. And stay away from other public places. If you must go out, avoid using any kind of public transportation, ridesharing, or taxis. 2. Monitor your symptoms carefully. If your symptoms get worse, call your healthcare provider immediately. 3. Get rest and stay hydrated. 4. If you have a medical appointment, call the healthcare provider ahead of time and tell them that you have or may have COVID-19. 5. For medical emergencies, call 911 and notify the dispatch personnel that you have or may have COVID-19. 6. Cover your cough and sneezes with a tissue or use the inside of your elbow. 7. Wash your hands often with soap and water for at least 20 seconds or clean your hands with an alcohol-based hand sanitizer that contains at least 60% alcohol. 8. As much as possible, stay in a specific room and away from other people in your home. Also, you should use a separate bathroom, if available. If you need to be around other people in or outside of the home, wear a mask. 9. Avoid sharing personal items with other people in your household, like dishes, towels, and bedding. 10. Clean all surfaces that are touched often, like counters, tabletops, and doorknobs. Use household cleaning sprays or wipes according to the label instructions. cdc.gov/coronavirus 09/02/2018 This information is not intended to replace advice given to you by your health care provider. Make sure you discuss any questions you have with your health care provider. Document Revised: 02/04/2019 Document Reviewed: 02/04/2019 Elsevier Patient Education  2020 Else[employer] Inc. What types of side effects do monoclonal antibody drugs cause?  Common side effects  In general, the more common side effects caused by monoclonal antibody drugs include: . Allergic reactions, such as hives or itching . Flu-like signs and  symptoms, including chills, fatigue, fever, and muscle aches and pains . Nausea, vomiting . Diarrhea . Skin rashes . Low blood pressure   The CDC is recommending patients who receive monoclonal antibody treatments wait at least 90 days before being vaccinated.  Currently, there are no data on the safety and efficacy of mRNA COVID-19 vaccines in persons who received monoclonal antibodies or convalescent plasma as part of COVID-19 treatment. Based on the estimated half-life of such therapies as well as evidence suggesting that reinfection is uncommon in the 90 days after initial infection, vaccination should be deferred for at least 90 days, as a precautionary measure until additional information becomes available, to avoid interference of the antibody treatment with vaccine-induced immune responses. If you have any questions or concerns after the infusion please call the Advanced Practice Provider on call at [phone]. This number is ONLY intended for your use regarding questions or concerns about the infusion post-treatment side-effects.  Please do not provide this number to others for use. For return to work notes please contact your primary care provider.   If someone you know is interested in receiving treatment please have them call the COVID hotline at [phone].   

## 2020-02-16 NOTE — Progress Notes (Signed)
  Diagnosis: COVID-19  Physician: Dr Joya Gaskins  Procedure: [facility] Infusion Clinic Med: casirivimab\imdevimab infusion - Provided patient with casirivimab\imdevimab fact sheet for patients, parents and caregivers prior to infusion.  Complications: No immediate complications noted.  Discharge: Discharged home   Ashley Murrain 02/16/2020

## 2020-02-16 NOTE — Progress Notes (Signed)
Patient reviewed Fact Sheet for Patients, Parents, and Caregivers for Emergency Use Authorization (EUA) of bamlanivimab and etesevimab for the Treatment of Coronavirus. Patient also reviewed and is agreeable to the estimated cost of treatment. Patient is agreeable to proceed.   

## 2020-04-12 ENCOUNTER — Ambulatory Visit (INDEPENDENT_AMBULATORY_CARE_PROVIDER_SITE_OTHER)
Admission: RE | Admit: 2020-04-12 | Discharge: 2020-04-12 | Disposition: A | Discharge status: Home / Self Care | Payer: 59 | Source: Ambulatory Visit | Attending: Internal Medicine | Admitting: Internal Medicine

## 2020-04-12 ENCOUNTER — Ambulatory Visit (INDEPENDENT_AMBULATORY_CARE_PROVIDER_SITE_OTHER): Payer: 59 | Admitting: Internal Medicine

## 2020-04-12 ENCOUNTER — Other Ambulatory Visit: Payer: Self-pay

## 2020-04-12 VITALS — BP 120/70 | HR 68 | Temp 98.1°F | Wt 215.0 lb

## 2020-04-12 DIAGNOSIS — M25561 Pain in right knee: Secondary | ICD-10-CM | POA: Diagnosis not present

## 2020-04-12 DIAGNOSIS — G8929 Other chronic pain: Secondary | ICD-10-CM

## 2020-04-12 DIAGNOSIS — E6609 Other obesity due to excess calories: Secondary | ICD-10-CM | POA: Diagnosis not present

## 2020-04-12 MED ORDER — DEXAMETHASONE SODIUM PHOSPHATE 100 MG/10ML IJ SOLN
10.0000 mg | Freq: Once | INTRAMUSCULAR | Status: AC
Start: 2020-04-12 — End: 2020-04-12
  Administered 2020-04-12: 10 mg via INTRAMUSCULAR

## 2020-04-12 MED ORDER — QSYMIA 7.5-46 MG PO CP24: 1.0000 | ORAL_CAPSULE | ORAL | 0 refills | Status: DC

## 2020-04-12 NOTE — Patient Instructions (Signed)
Journal for Nurse Practitioners, 15(4), 263-267. Retrieved December 08, 2017 from [email or web address].com/[email or web address]">  Knee Exercises Ask your health care provider which exercises are safe for you. Do exercises exactly as told by your health care provider and adjust them as directed. It is normal to feel mild stretching, pulling, tightness, or discomfort as you do these exercises. Stop right away if you feel sudden pain or your pain gets worse. Do not begin these exercises until told by your health care provider. Stretching and range-of-motion exercises These exercises warm up your muscles and joints and improve the movement and flexibility of your knee. These exercises also help to relieve pain and swelling. Knee extension, prone 1. Lie on your abdomen (prone position) on a bed. 2. Place your left / right knee just beyond the edge of the surface so your knee is not on the bed. You can put a towel under your left / right thigh just above your kneecap for comfort. 3. Relax your leg muscles and allow gravity to straighten your knee (extension). You should feel a stretch behind your left / right knee. 4. Hold this position for __________ seconds. 5. Scoot up so your knee is supported between repetitions. Repeat __________ times. Complete this exercise __________ times a day. Knee flexion, active 1. Lie on your back with both legs straight. If this causes back discomfort, bend your left / right knee so your foot is flat on the floor. 2. Slowly slide your left / right heel back toward your buttocks. Stop when you feel a gentle stretch in the front of your knee or thigh (flexion). 3. Hold this position for __________ seconds. 4. Slowly slide your left / right heel back to the starting position. Repeat __________ times. Complete this exercise __________ times a day.   Quadriceps stretch, prone 1. Lie on your abdomen on a firm surface, such as a bed or padded floor. 2. Bend your left / right knee and hold  your ankle. If you cannot reach your ankle or pant leg, loop a belt around your foot and grab the belt instead. 3. Gently pull your heel toward your buttocks. Your knee should not slide out to the side. You should feel a stretch in the front of your thigh and knee (quadriceps). 4. Hold this position for __________ seconds. Repeat __________ times. Complete this exercise __________ times a day.   Hamstring, supine 1. Lie on your back (supine position). 2. Loop a belt or towel over the ball of your left / right foot. The ball of your foot is on the walking surface, right under your toes. 3. Straighten your left / right knee and slowly pull on the belt to raise your leg until you feel a gentle stretch behind your knee (hamstring). ? Do not let your knee bend while you do this. ? Keep your other leg flat on the floor. 4. Hold this position for __________ seconds. Repeat __________ times. Complete this exercise __________ times a day. Strengthening exercises These exercises build strength and endurance in your knee. Endurance is the ability to use your muscles for a long time, even after they get tired. Quadriceps, isometric This exercise stretches the muscles in front of your thigh (quadriceps) without moving your knee joint (isometric). 1. Lie on your back with your left / right leg extended and your other knee bent. Put a rolled towel or small pillow under your knee if told by your health care provider. 2. Slowly tense the muscles in the front of your   left / right thigh. You should see your kneecap slide up toward your hip or see increased dimpling just above the knee. This motion will push the back of the knee toward the floor. 3. For __________ seconds, hold the muscle as tight as you can without increasing your pain. 4. Relax the muscles slowly and completely. Repeat __________ times. Complete this exercise __________ times a day.   Straight leg raises This exercise stretches the muscles in  front of your thigh (quadriceps) and the muscles that move your hips (hip flexors). 1. Lie on your back with your left / right leg extended and your other knee bent. 2. Tense the muscles in the front of your left / right thigh. You should see your kneecap slide up or see increased dimpling just above the knee. Your thigh may even shake a bit. 3. Keep these muscles tight as you raise your leg 4-6 inches (10-15 cm) off the floor. Do not let your knee bend. 4. Hold this position for __________ seconds. 5. Keep these muscles tense as you lower your leg. 6. Relax your muscles slowly and completely after each repetition. Repeat __________ times. Complete this exercise __________ times a day. Hamstring, isometric 1. Lie on your back on a firm surface. 2. Bend your left / right knee about __________ degrees. 3. Dig your left / right heel into the surface as if you are trying to pull it toward your buttocks. Tighten the muscles in the back of your thighs (hamstring) to "dig" as hard as you can without increasing any pain. 4. Hold this position for __________ seconds. 5. Release the tension gradually and allow your muscles to relax completely for __________ seconds after each repetition. Repeat __________ times. Complete this exercise __________ times a day. Hamstring curls If told by your health care provider, do this exercise while wearing ankle weights. Begin with __________ lb weights. Then increase the weight by 1 lb (0.5 kg) increments. Do not wear ankle weights that are more than __________ lb. 1. Lie on your abdomen with your legs straight. 2. Bend your left / right knee as far as you can without feeling pain. Keep your hips flat against the floor. 3. Hold this position for __________ seconds. 4. Slowly lower your leg to the starting position. Repeat __________ times. Complete this exercise __________ times a day.   Squats This exercise strengthens the muscles in front of your thigh and knee  (quadriceps). 1. Stand in front of a table, with your feet and knees pointing straight ahead. You may rest your hands on the table for balance but not for support. 2. Slowly bend your knees and lower your hips like you are going to sit in a chair. ? Keep your weight over your heels, not over your toes. ? Keep your lower legs upright so they are parallel with the table legs. ? Do not let your hips go lower than your knees. ? Do not bend lower than told by your health care provider. ? If your knee pain increases, do not bend as low. 3. Hold the squat position for __________ seconds. 4. Slowly push with your legs to return to standing. Do not use your hands to pull yourself to standing. Repeat __________ times. Complete this exercise __________ times a day. Wall slides This exercise strengthens the muscles in front of your thigh and knee (quadriceps). 1. Lean your back against a smooth wall or door, and walk your feet out 18-24 inches (46-61 cm) from it. 2.   Place your feet hip-width apart. 3. Slowly slide down the wall or door until your knees bend __________ degrees. Keep your knees over your heels, not over your toes. Keep your knees in line with your hips. 4. Hold this position for __________ seconds. Repeat __________ times. Complete this exercise __________ times a day.   Straight leg raises This exercise strengthens the muscles that rotate the leg at the hip and move it away from your body (hip abductors). 1. Lie on your side with your left / right leg in the top position. Lie so your head, shoulder, knee, and hip line up. You may bend your bottom knee to help you keep your balance. 2. Roll your hips slightly forward so your hips are stacked directly over each other and your left / right knee is facing forward. 3. Leading with your heel, lift your top leg 4-6 inches (10-15 cm). You should feel the muscles in your outer hip lifting. ? Do not let your foot drift forward. ? Do not let your  knee roll toward the ceiling. 4. Hold this position for __________ seconds. 5. Slowly return your leg to the starting position. 6. Let your muscles relax completely after each repetition. Repeat __________ times. Complete this exercise __________ times a day.   Straight leg raises This exercise stretches the muscles that move your hips away from the front of the pelvis (hip extensors). 1. Lie on your abdomen on a firm surface. You can put a pillow under your hips if that is more comfortable. 2. Tense the muscles in your buttocks and lift your left / right leg about 4-6 inches (10-15 cm). Keep your knee straight as you lift your leg. 3. Hold this position for __________ seconds. 4. Slowly lower your leg to the starting position. 5. Let your leg relax completely after each repetition. Repeat __________ times. Complete this exercise __________ times a day. This information is not intended to replace advice given to you by your health care provider. Make sure you discuss any questions you have with your health care provider. Document Revised: 12/09/2017 Document Reviewed: 12/09/2017 Elsevier Patient Education  2021 [employer] Inc.  

## 2020-04-12 NOTE — Progress Notes (Unsigned)
Subjective:    Patient ID: Alexis Suarez, female    DOB: December 17, 1975, 45 y.o.   MRN: [id number]  HPI  Patient presents the clinic today with complaint of abnormal weight gain.  Her current weight is 215 pounds with a BMI of 31.75.  She would like to get started on Qsymia which she has taken in the past with good results.  Her BP today is 120/70.  She also reports right knee pain.  She reports this started 1 year ago.  She describes the pain as intermittently achy and sharp at times.  She has not noticed any associated swelling or bruising.  She denies any injury to the area.  She reports knee pain is interfering with her ability to work out.  Review of Systems      Past Medical History:  Diagnosis Date  . Asthma    ED eval in the past  . Sciatica     Current Outpatient Medications  Medication Sig Dispense Refill  . Minocycline HCl Micronized (AMZEEQ) 4 % FOAM Apply topically.    . Phentermine-Topiramate (QSYMIA) 7.5-46 MG CP24 Take 1 tablet by mouth every morning. (Patient not taking: Reported on 10/21/2019) 30 capsule 2  . pimecrolimus (ELIDEL) 1 % cream Apply topically 2 (two) times daily.    Marland Kitchen spironolactone (ALDACTONE) 100 MG tablet Take 50 mg by mouth daily.     No current facility-administered medications for this visit.    No Known Allergies  Family History  Problem Relation Age of Onset  . Thyroid disease Mother   . Hypertension Sister   . Miscarriages / Stillbirths Sister   . Stroke Sister   . Diabetes Father   . Prostate cancer Paternal Grandfather   . Cancer Paternal Grandfather        Prostate    Social History   Socioeconomic History  . Marital status: Single    Spouse name: Not on file  . Number of children: 1  . Years of education: Not on file  . Highest education level: Not on file  Occupational History  . Not on file  Tobacco Use  . Smoking status: Never Smoker  . Smokeless tobacco: Never Used  Vaping Use  . Vaping Use: Never used   Substance and Sexual Activity  . Alcohol use: Yes    Alcohol/week: 0.0 standard drinks    Comment: Rare  . Drug use: No  . Sexual activity: Yes    Partners: Male  Other Topics Concern  . Not on file  Social History Narrative   Lives in [place] with mother, son and boyfriend in a one story home. 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      Education: college degree and 1 year of grad school.      Diet - regular diet            Social Determinants of Health   Financial Resource Strain: Not on file  Food Insecurity: Not on file  Transportation Needs: Not on file  Physical Activity: Not on file  Stress: Not on file  Social Connections: Not on file  Intimate Partner Violence: Not on file     Constitutional: Patient reports abnormal weight gain.  Denies fever, malaise, fatigue, headache.  Respiratory: Denies difficulty breathing, shortness of breath, cough or sputum production.   Cardiovascular: Denies chest pain, chest tightness, palpitations or swelling in the hands or feet.  Musculoskeletal: Patient reports right knee pain.  Denies decrease in range of motion, difficulty with  gait, muscle pain or joint swelling.   No other specific complaints in a complete review of systems (except as listed in HPI above).  Objective:   Physical Exam  BP 120/70   Pulse 68   Temp 98.1 F (36.7 C) (Temporal)   Wt 215 lb (97.5 kg)   LMP 04/09/2012   BMI 31.75 kg/m   Wt Readings from Last 3 Encounters:  10/21/19 214 lb (97.1 kg)  06/09/19 213 lb 4.8 oz (96.8 kg)  03/29/19 209 lb 11.2 oz (95.1 kg)    General: Appears her stated age, obese, in NAD. Cardiovascular: Normal rate. Pulmonary/Chest: Normal effort. Musculoskeletal: Normal flexion and extension of the right knee.  Pain with palpation over the medial and lateral joint lines.  Negative Lachman's.  Negative McMurray.  No signs of joint swelling. No difficulty with gait.  Neurological: Alert and oriented.     BMET    Component  Value Date/Time   NA 136 10/21/2019 1019   NA 139 02/10/2014 0516   K 4.0 10/21/2019 1019   K 3.8 02/10/2014 0516   CL 104 10/21/2019 1019   CL 103 02/10/2014 0516   CO2 26 10/21/2019 1019   CO2 30 02/10/2014 0516   GLUCOSE 83 10/21/2019 1019   GLUCOSE 76 02/10/2014 0516   BUN 14 10/21/2019 1019   BUN 5 (L) 02/10/2014 0516   CREATININE 0.96 10/21/2019 1019   CREATININE 1.00 11/18/2018 0000   CALCIUM 9.5 10/21/2019 1019   CALCIUM 7.9 (L) 02/10/2014 0516   GFRNONAA 69 11/18/2018 0000   GFRAA 80 11/18/2018 0000    Lipid Panel     Component Value Date/Time   CHOL 170 11/18/2018 0000   TRIG 52 11/18/2018 0000   HDL 55 11/18/2018 0000   CHOLHDL 3.1 11/18/2018 0000   VLDL 10.0 09/24/2017 1539   LDLCALC 102 (H) 11/18/2018 0000    CBC    Component Value Date/Time   WBC 8.6 10/21/2019 1019   RBC 4.66 10/21/2019 1019   HGB 12.8 10/21/2019 1019   HGB 8.4 (L) 02/10/2014 1750   HCT 38.9 10/21/2019 1019   HCT 25.0 (L) 02/10/2014 0516   PLT 240.0 10/21/2019 1019   PLT 125 (L) 02/10/2014 0516   MCV 83.5 10/21/2019 1019   MCV 87 02/10/2014 0516   MCH 27.7 11/18/2018 0000   MCHC 32.8 10/21/2019 1019   RDW 13.2 10/21/2019 1019   RDW 14.4 02/10/2014 0516   LYMPHSABS 2.2 10/21/2019 1019   LYMPHSABS 1.6 02/10/2014 0516   MONOABS 0.6 10/21/2019 1019   MONOABS 0.9 02/10/2014 0516   EOSABS 0.4 10/21/2019 1019   EOSABS 0.2 02/10/2014 0516   BASOSABS 0.0 10/21/2019 1019   BASOSABS 0.0 02/10/2014 0516    Hgb A1C Lab Results  Component Value Date   HGBA1C 5.5 10/21/2019           Assessment & Plan:  Abnormal Weight Gain:  Encouraged low-carb, high-protein diet Restrict calories to 1500 cal/day Encouraged 150 minutes of light exercise weekly Qsymia refilled today  Chronic Right Knee Pain:  Decadron 10 mg IM today X-ray right knee today  We will follow-up after x-ray, RTC in 1 month for weight check and med refill Webb Silversmith, NP This visit occurred during the  SARS-CoV-2 public health emergency.  Safety protocols were in place, including screening questions prior to the visit, additional usage of staff PPE, and extensive cleaning of exam room while observing appropriate contact time as indicated for disinfecting solutions.

## 2020-04-13 ENCOUNTER — Encounter: Payer: Self-pay | Admitting: Internal Medicine

## 2020-04-17 ENCOUNTER — Encounter: Payer: 59 | Admitting: Internal Medicine

## 2020-04-24 ENCOUNTER — Encounter: Payer: 59 | Admitting: Internal Medicine

## 2020-06-09 ENCOUNTER — Ambulatory Visit (INDEPENDENT_AMBULATORY_CARE_PROVIDER_SITE_OTHER): Payer: 59 | Admitting: Internal Medicine

## 2020-06-09 ENCOUNTER — Encounter: Payer: Self-pay | Admitting: Internal Medicine

## 2020-06-09 ENCOUNTER — Other Ambulatory Visit: Payer: Self-pay

## 2020-06-09 VITALS — BP 122/74 | HR 80 | Temp 98.0°F | Ht 67.0 in | Wt 209.0 lb

## 2020-06-09 DIAGNOSIS — D2361 Other benign neoplasm of skin of right upper limb, including shoulder: Secondary | ICD-10-CM | POA: Diagnosis not present

## 2020-06-09 DIAGNOSIS — Z0001 Encounter for general adult medical examination with abnormal findings: Secondary | ICD-10-CM | POA: Diagnosis not present

## 2020-06-09 DIAGNOSIS — E6609 Other obesity due to excess calories: Secondary | ICD-10-CM

## 2020-06-09 DIAGNOSIS — Z1272 Encounter for screening for malignant neoplasm of vagina: Secondary | ICD-10-CM

## 2020-06-09 DIAGNOSIS — G8929 Other chronic pain: Secondary | ICD-10-CM

## 2020-06-09 DIAGNOSIS — M25561 Pain in right knee: Secondary | ICD-10-CM | POA: Diagnosis not present

## 2020-06-09 DIAGNOSIS — Z113 Encounter for screening for infections with a predominantly sexual mode of transmission: Secondary | ICD-10-CM

## 2020-06-09 MED ORDER — QSYMIA 7.5-46 MG PO CP24: 1.0000 | ORAL_CAPSULE | ORAL | 0 refills | Status: DC

## 2020-06-09 MED ORDER — TRIAMCINOLONE ACETONIDE 0.1 % EX CREA: 1.0000 "application " | TOPICAL_CREAM | Freq: Two times a day (BID) | CUTANEOUS | 0 refills | Status: DC

## 2020-06-09 NOTE — Patient Instructions (Signed)
Health Maintenance, Female Adopting a healthy lifestyle and getting preventive care are important in promoting health and wellness. Ask your health care provider about:  The right schedule for you to have regular tests and exams.  Things you can do on your own to prevent diseases and keep yourself healthy. What should I know about diet, weight, and exercise? Eat a healthy diet  Eat a diet that includes plenty of vegetables, fruits, low-fat dairy products, and lean protein.  Do not eat a lot of foods that are high in solid fats, added sugars, or sodium.   Maintain a healthy weight Body mass index (BMI) is used to identify weight problems. It estimates body fat based on height and weight. Your health care provider can help determine your BMI and help you achieve or maintain a healthy weight. Get regular exercise Get regular exercise. This is one of the most important things you can do for your health. Most adults should:  Exercise for at least 150 minutes each week. The exercise should increase your heart rate and make you sweat (moderate-intensity exercise).  Do strengthening exercises at least twice a week. This is in addition to the moderate-intensity exercise.  Spend less time sitting. Even light physical activity can be beneficial. Watch cholesterol and blood lipids Have your blood tested for lipids and cholesterol at 44 years of age, then have this test every 5 years. Have your cholesterol levels checked more often if:  Your lipid or cholesterol levels are high.  You are older than 44 years of age.  You are at high risk for heart disease. What should I know about cancer screening? Depending on your health history and family history, you may need to have cancer screening at various ages. This may include screening for:  Breast cancer.  Cervical cancer.  Colorectal cancer.  Skin cancer.  Lung cancer. What should I know about heart disease, diabetes, and high blood  pressure? Blood pressure and heart disease  High blood pressure causes heart disease and increases the risk of stroke. This is more likely to develop in people who have high blood pressure readings, are of African descent, or are overweight.  Have your blood pressure checked: ? Every 3-5 years if you are 18-39 years of age. ? Every year if you are 40 years old or older. Diabetes Have regular diabetes screenings. This checks your fasting blood sugar level. Have the screening done:  Once every three years after age 40 if you are at a normal weight and have a low risk for diabetes.  More often and at a younger age if you are overweight or have a high risk for diabetes. What should I know about preventing infection? Hepatitis B If you have a higher risk for hepatitis B, you should be screened for this virus. Talk with your health care provider to find out if you are at risk for hepatitis B infection. Hepatitis C Testing is recommended for:  Everyone born from 1945 through 1965.  Anyone with known risk factors for hepatitis C. Sexually transmitted infections (STIs)  Get screened for STIs, including gonorrhea and chlamydia, if: ? You are sexually active and are younger than 44 years of age. ? You are older than 44 years of age and your health care provider tells you that you are at risk for this type of infection. ? Your sexual activity has changed since you were last screened, and you are at increased risk for chlamydia or gonorrhea. Ask your health care provider   if you are at risk.  Ask your health care provider about whether you are at high risk for HIV. Your health care provider may recommend a prescription medicine to help prevent HIV infection. If you choose to take medicine to prevent HIV, you should first get tested for HIV. You should then be tested every 3 months for as long as you are taking the medicine. Pregnancy  If you are about to stop having your period (premenopausal) and  you may become pregnant, seek counseling before you get pregnant.  Take 400 to 800 micrograms (mcg) of folic acid every day if you become pregnant.  Ask for birth control (contraception) if you want to prevent pregnancy. Osteoporosis and menopause Osteoporosis is a disease in which the bones lose minerals and strength with aging. This can result in bone fractures. If you are 65 years old or older, or if you are at risk for osteoporosis and fractures, ask your health care provider if you should:  Be screened for bone loss.  Take a calcium or vitamin D supplement to lower your risk of fractures.  Be given hormone replacement therapy (HRT) to treat symptoms of menopause. Follow these instructions at home: Lifestyle  Do not use any products that contain nicotine or tobacco, such as cigarettes, e-cigarettes, and chewing tobacco. If you need help quitting, ask your health care provider.  Do not use street drugs.  Do not share needles.  Ask your health care provider for help if you need support or information about quitting drugs. Alcohol use  Do not drink alcohol if: ? Your health care provider tells you not to drink. ? You are pregnant, may be pregnant, or are planning to become pregnant.  If you drink alcohol: ? Limit how much you use to 0-1 drink a day. ? Limit intake if you are breastfeeding.  Be aware of how much alcohol is in your drink. In the [place] one drink equals one 12 oz bottle of beer (355 mL), one 5 oz glass of wine (148 mL), or one 1 oz glass of hard liquor (44 mL). General instructions  Schedule regular health, dental, and eye exams.  Stay current with your vaccines.  Tell your health care provider if: ? You often feel depressed. ? You have ever been abused or do not feel safe at home. Summary  Adopting a healthy lifestyle and getting preventive care are important in promoting health and wellness.  Follow your health care provider's instructions about healthy  diet, exercising, and getting tested or screened for diseases.  Follow your health care provider's instructions on monitoring your cholesterol and blood pressure. This information is not intended to replace advice given to you by your health care provider. Make sure you discuss any questions you have with your health care provider. Document Revised: 02/11/2018 Document Reviewed: 02/11/2018 Elsevier Patient Education  2021 [employer].  

## 2020-06-09 NOTE — Progress Notes (Signed)
Subjective:    Patient ID: Alexis Suarez, female    DOB: [birth year], 45 y.o.   MRN: [id number]  HPI  Patient presents the clinic today for her annual exam.  Chronic Right Knee Pain: Exacerbated by her weight.  She was started on Qsymia 04/2020.  She has been taking the medication as prescribed.  Her weight today is 209 lbs with a BMI of 32.73.  She has lost 6 pounds.  Flu: 01/2018 Tetanus: 04/2010 COVID: Pfizer x 2 Pap smear: 01/2014, partial hysterectomy Mammogram: 07/2019 Colon screening: 07/2014, 5 years Vision screen: annually Dentist: annually  Diet: She does eat meat. She consumes fruits and veggies. She tries to avoid fried foods. She drinks mostly water, ginger ale Exercise: Walking  Review of Systems      Past Medical History:  Diagnosis Date  . Asthma    ED eval in the past  . Sciatica     Current Outpatient Medications  Medication Sig Dispense Refill  . Phentermine-Topiramate (QSYMIA) 7.5-46 MG CP24 Take 1 tablet by mouth every morning. 30 capsule 0  . pimecrolimus (ELIDEL) 1 % cream Apply topically 2 (two) times daily.     No current facility-administered medications for this visit.    No Known Allergies  Family History  Problem Relation Age of Onset  . Thyroid disease Mother   . Hypertension Sister   . Miscarriages / Stillbirths Sister   . Stroke Sister   . Diabetes Father   . Prostate cancer Paternal Grandfather   . Cancer Paternal Grandfather        Prostate    Social History   Socioeconomic History  . Marital status: Single    Spouse name: Not on file  . Number of children: 1  . Years of education: Not on file  . Highest education level: Not on file  Occupational History  . Not on file  Tobacco Use  . Smoking status: Never Smoker  . Smokeless tobacco: Never Used  Vaping Use  . Vaping Use: Never used  Substance and Sexual Activity  . Alcohol use: Yes    Alcohol/week: 0.0 standard drinks    Comment: Rare  . Drug use: No  .  Sexual activity: Yes    Partners: Male  Other Topics Concern  . Not on file  Social History Narrative   Lives in [place] with mother, son and boyfriend in a one story home. 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      Education: college degree and 1 year of grad school.      Diet - regular diet            Social Determinants of Health   Financial Resource Strain: Not on file  Food Insecurity: Not on file  Transportation Needs: Not on file  Physical Activity: Not on file  Stress: Not on file  Social Connections: Not on file  Intimate Partner Violence: Not on file     Constitutional: Denies fever, malaise, fatigue, headache or abrupt weight changes.  HEENT: Denies eye pain, eye redness, ear pain, ringing in the ears, wax buildup, runny nose, nasal congestion, bloody nose, or sore throat. Respiratory: Denies difficulty breathing, shortness of breath, cough or sputum production.   Cardiovascular: Denies chest pain, chest tightness, palpitations or swelling in the hands or feet.  Gastrointestinal: Denies abdominal pain, bloating, constipation, diarrhea or blood in the stool.  GU: Denies urgency, frequency, pain with urination, burning sensation, blood in urine, odor or discharge. Musculoskeletal: Patient reports  chronic knee pain.  Denies decrease in range of motion, difficulty with gait, muscle pain or joint swelling.  Skin: Pt reports skin lesion right hand. Denies redness, rashes, or ulcercations.  Neurological: Denies dizziness, difficulty with memory, difficulty with speech or problems with balance and coordination.  Psych: Denies anxiety, depression, SI/HI.  No other specific complaints in a complete review of systems (except as listed in HPI above).  Objective:   Physical Exam  BP 122/74   Pulse 80   Temp 98 F (36.7 C) (Temporal)   Ht _0  (1.702 m)   Wt 209 lb (94.8 kg)   LMP 04/09/2012   BMI 32.73 kg/m   Wt Readings from Last 3 Encounters:  04/12/20 215 lb (97.5 kg)   10/21/19 214 lb (97.1 kg)  06/09/19 213 lb 4.8 oz (96.8 kg)    General: Appears her stated age, obese, in NAD. Skin: Warm, dry and intact. Dermatofibroma noted of right hand. HEENT: Head: normal shape and size; Eyes: sclera white and EOMs intact;  Neck:  Neck supple, trachea midline. No masses, lumps or thyromegaly present.  Cardiovascular: Normal rate and rhythm. S1,S2 noted.  No murmur, rubs or gallops noted. No JVD or BLE edema. No carotid bruits noted. Pulmonary/Chest: Normal effort and positive vesicular breath sounds. No respiratory distress. No wheezes, rales or ronchi noted.  Abdomen: Soft and nontender.  Pelvic: Normal female anatomy. Vaginal pap, no cervix. Adnexa non palpable. Musculoskeletal: . No difficulty with gait.  Neurological: Alert and oriented. Cranial nerves II-XII grossly intact. Coordination normal.  Psychiatric: Mood and affect normal. Behavior is normal. Judgment and thought content normal.     BMET    Component Value Date/Time   NA 136 10/21/2019 1019   NA 139 02/10/2014 0516   K 4.0 10/21/2019 1019   K 3.8 02/10/2014 0516   CL 104 10/21/2019 1019   CL 103 02/10/2014 0516   CO2 26 10/21/2019 1019   CO2 30 02/10/2014 0516   GLUCOSE 83 10/21/2019 1019   GLUCOSE 76 02/10/2014 0516   BUN 14 10/21/2019 1019   BUN 5 (L) 02/10/2014 0516   CREATININE 0.96 10/21/2019 1019   CREATININE 1.00 11/18/2018 0000   CALCIUM 9.5 10/21/2019 1019   CALCIUM 7.9 (L) 02/10/2014 0516   GFRNONAA 69 11/18/2018 0000   GFRAA 80 11/18/2018 0000    Lipid Panel     Component Value Date/Time   CHOL 170 11/18/2018 0000   TRIG 52 11/18/2018 0000   HDL 55 11/18/2018 0000   CHOLHDL 3.1 11/18/2018 0000   VLDL 10.0 09/24/2017 1539   LDLCALC 102 (H) 11/18/2018 0000    CBC    Component Value Date/Time   WBC 8.6 10/21/2019 1019   RBC 4.66 10/21/2019 1019   HGB 12.8 10/21/2019 1019   HGB 8.4 (L) 02/10/2014 1750   HCT 38.9 10/21/2019 1019   HCT 25.0 (L) 02/10/2014 0516    PLT 240.0 10/21/2019 1019   PLT 125 (L) 02/10/2014 0516   MCV 83.5 10/21/2019 1019   MCV 87 02/10/2014 0516   MCH 27.7 11/18/2018 0000   MCHC 32.8 10/21/2019 1019   RDW 13.2 10/21/2019 1019   RDW 14.4 02/10/2014 0516   LYMPHSABS 2.2 10/21/2019 1019   LYMPHSABS 1.6 02/10/2014 0516   MONOABS 0.6 10/21/2019 1019   MONOABS 0.9 02/10/2014 0516   EOSABS 0.4 10/21/2019 1019   EOSABS 0.2 02/10/2014 0516   BASOSABS 0.0 10/21/2019 1019   BASOSABS 0.0 02/10/2014 0516    Hgb A1C  Lab Results  Component Value Date   HGBA1C 5.5 10/21/2019            Assessment & Plan:   Preventative Health Maintenance:  Encouraged her to get a flu shot in the fall She declines tetanus Encouraged her to get a COVID booster Vaginal pap smear today with STD screening She was scheduled for mammogram for 07/2020 She declines referral to GI for repeat screening colonoscopy Encouraged her to consume a balanced diet exercise regimen Advised her to see an eye doctor and dentist annually  We will check CBC, c-Met, TSH, lipid and A1c today  Abnormal Weight Gain:  Qysemia refilled today Encouraged high protein, low carb diet and exercise for weight loss We will follow-up after labs, return precautions discussed Webb Silversmith, NP This visit occurred during the SARS-CoV-2 public health emergency.  Safety protocols were in place, including screening questions prior to the visit, additional usage of staff PPE, and extensive cleaning of exam room while observing appropriate contact time as indicated for disinfecting solutions.

## 2020-06-10 LAB — COMPREHENSIVE METABOLIC PANEL
AG Ratio: 1.5 (calc) (ref 1.0–2.5)
ALT: 10 U/L (ref 6–29)
AST: 11 U/L (ref 10–30)
Albumin: 4.2 g/dL (ref 3.6–5.1)
Alkaline phosphatase (APISO): 83 U/L (ref 31–125)
BUN: 10 mg/dL (ref 7–25)
CO2: 25 mmol/L (ref 20–32)
Calcium: 9.3 mg/dL (ref 8.6–10.2)
Chloride: 107 mmol/L (ref 98–110)
Creat: 1.05 mg/dL (ref 0.50–1.10)
Globulin: 2.8 g/dL (calc) (ref 1.9–3.7)
Glucose, Bld: 77 mg/dL (ref 65–99)
Potassium: 3.9 mmol/L (ref 3.5–5.3)
Sodium: 141 mmol/L (ref 135–146)
Total Bilirubin: 0.6 mg/dL (ref 0.2–1.2)
Total Protein: 7 g/dL (ref 6.1–8.1)

## 2020-06-10 LAB — CBC
HCT: 38.2 % (ref 35.0–45.0)
Hemoglobin: 12.6 g/dL (ref 11.7–15.5)
MCH: 27.3 pg (ref 27.0–33.0)
MCHC: 33 g/dL (ref 32.0–36.0)
MCV: 82.7 fL (ref 80.0–100.0)
MPV: 11.6 fL (ref 7.5–12.5)
Platelets: 275 10*3/uL (ref 140–400)
RBC: 4.62 10*6/uL (ref 3.80–5.10)
RDW: 13 % (ref 11.0–15.0)
WBC: 7.8 10*3/uL (ref 3.8–10.8)

## 2020-06-10 LAB — HEMOGLOBIN A1C
Hgb A1c MFr Bld: 5.1 % of total Hgb (ref <5.7)
Mean Plasma Glucose: 100 mg/dL
eAG (mmol/L): 5.5 mmol/L

## 2020-06-10 LAB — LIPID PANEL
Cholesterol: 154 mg/dL (ref <200)
HDL: 50 mg/dL (ref >=50)
LDL Cholesterol (Calc): 88 mg/dL (calc)
Non-HDL Cholesterol (Calc): 104 mg/dL (calc) (ref <130)
Total CHOL/HDL Ratio: 3.1 (calc) (ref <5.0)
Triglycerides: 73 mg/dL (ref <150)

## 2020-06-10 LAB — TSH: TSH: 1.41 mIU/L

## 2020-06-12 ENCOUNTER — Other Ambulatory Visit (HOSPITAL_COMMUNITY)
Admission: RE | Admit: 2020-06-12 | Discharge: 2020-06-12 | Disposition: A | Discharge status: Home / Self Care | Payer: 59 | Source: Ambulatory Visit | Attending: Internal Medicine | Admitting: Internal Medicine

## 2020-06-12 DIAGNOSIS — Z1272 Encounter for screening for malignant neoplasm of vagina: Secondary | ICD-10-CM | POA: Insufficient documentation

## 2020-06-12 DIAGNOSIS — Z113 Encounter for screening for infections with a predominantly sexual mode of transmission: Secondary | ICD-10-CM | POA: Diagnosis present

## 2020-06-12 NOTE — Addendum Note (Signed)
Addended by: Lurlean Nanny on: 06/12/2020 12:47 PM   Modules accepted: Orders

## 2020-06-15 ENCOUNTER — Encounter: Payer: Self-pay | Admitting: Internal Medicine

## 2020-06-15 LAB — CYTOLOGY - PAP
Comment: NEGATIVE
Diagnosis: UNDETERMINED — AB
High risk HPV: NEGATIVE

## 2020-09-20 ENCOUNTER — Other Ambulatory Visit: Payer: Self-pay

## 2020-09-20 ENCOUNTER — Ambulatory Visit (INDEPENDENT_AMBULATORY_CARE_PROVIDER_SITE_OTHER): Payer: 59 | Admitting: Nurse Practitioner

## 2020-09-20 ENCOUNTER — Encounter: Payer: Self-pay | Admitting: Nurse Practitioner

## 2020-09-20 DIAGNOSIS — M25561 Pain in right knee: Secondary | ICD-10-CM

## 2020-09-20 DIAGNOSIS — G8929 Other chronic pain: Secondary | ICD-10-CM | POA: Diagnosis not present

## 2020-09-20 NOTE — Progress Notes (Signed)
Subjective:  Patient ID: Alexis Suarez, female    DOB: April 07, 1975  Age: 45 y.o. MRN: [id number]  CC: Establish Care (TOC-Baity/Pt would like to discuss right knee pain that she has had for 1-2 years. Pt states the pain comes and goes but is worse when going up and down steps. )  HPI  Chronic pain of right knee Onset 12yrs ago, worsening, has difficulty with going up and down stairs. Denies any previous injury, minimal improvement with use of NSAIDs, tylenol and steroid injection. Normal knee x-ray completed 04/2020  Advised to use hinged knee brace and referred to outpatient PT. Advised to monitor daily exercise: use stationery bike or swimming Consider MRI knee if no improvement   Reviewed past Medical, Social and Family history today.  Outpatient Medications Prior to Visit  Medication Sig Dispense Refill   clindamycin (CLEOCIN) 300 MG capsule Take by mouth.     Phentermine-Topiramate (QSYMIA) 7.5-46 MG CP24 Take 1 tablet by mouth every morning. 30 capsule 0   pimecrolimus (ELIDEL) 1 % cream Apply topically 2 (two) times daily.     triamcinolone cream (KENALOG) 0.1 % Apply 1 application topically 2 (two) times daily. 30 g 0   No facility-administered medications prior to visit.    ROS See HPI  Objective:  BP 100/64 (BP Location: Left Arm, Patient Position: Sitting, Cuff Size: Large)   Temp (!) 97.3 F (36.3 C) (Temporal)   Ht 5\' 8"  (1.727 m)   Wt 216 lb (98 kg)   LMP 04/09/2012   BMI 32.84 kg/m   Physical Exam Vitals reviewed.  Cardiovascular:     Rate and Rhythm: Normal rate.     Pulses: Normal pulses.  Musculoskeletal:        General: Tenderness present. No swelling, deformity or signs of injury.     Right knee: Crepitus present. No swelling, effusion, erythema or bony tenderness. Normal range of motion. Tenderness present over the medial joint line. No lateral joint line or patellar tendon tenderness. Normal pulse.     Instability Tests: Anterior drawer test  negative. Posterior drawer test negative.     Left knee: Crepitus present. No swelling, effusion, erythema or bony tenderness. Normal range of motion. Tenderness present over the medial joint line. No lateral joint line or patellar tendon tenderness. Normal pulse.     Instability Tests: Anterior drawer test negative. Posterior drawer test negative.     Right lower leg: Normal. No edema.     Left lower leg: Normal. No edema.  Neurological:     Mental Status: She is alert.   Assessment & Plan:  This visit occurred during the SARS-CoV-2 public health emergency.  Safety protocols were in place, including screening questions prior to the visit, additional usage of staff PPE, and extensive cleaning of exam room while observing appropriate contact time as indicated for disinfecting solutions.   Alexis Suarez was seen today for establish care.  Diagnoses and all orders for this visit:  Chronic pain of right knee -     Ambulatory referral to Physical Therapy  Problem List Items Addressed This Visit       Other   Chronic pain of right knee    Onset 23yrs ago, worsening, has difficulty with going up and down stairs. Denies any previous injury, minimal improvement with use of NSAIDs, tylenol and steroid injection. Normal knee x-ray completed 04/2020  Advised to use hinged knee brace and referred to outpatient PT. Advised to monitor daily exercise: use stationery bike or  swimming Consider MRI knee if no improvement       Relevant Orders   Ambulatory referral to Physical Therapy    Follow-up: Return if symptoms worsen or fail to improve.  Wilfred Lacy, NP

## 2020-09-20 NOTE — Assessment & Plan Note (Addendum)
Onset 49yrs ago, worsening, has difficulty with going up and down stairs. Denies any previous injury, minimal improvement with use of NSAIDs, tylenol and steroid injection. Normal knee x-ray completed 04/2020  Advised to use hinged knee brace and referred to outpatient PT. Advised to monitor daily exercise: use stationery bike or swimming Consider MRI knee if no improvement

## 2020-09-20 NOTE — Patient Instructions (Signed)
You will be contacted to schedule appt with PT. Chronic Knee Pain, Adult Knee pain that lasts longer than 3 months is called chronic knee pain. You may have pain in one or both knees. Symptoms of chronic knee pain may also include swelling and stiffness. The most common cause is age-related wear and tear (osteoarthritis) of your knee joint. Many conditions can cause chronic knee pain. Treatment depends on the cause. The main treatments are physical therapy and weight loss. It may also be treated with medicines, injections, a knee sleeve or brace, and by using crutches. Rest, ice, pressure (compression), and elevation, also known as RICE therapy, may also be recommended. Follow these instructions at home: If you have a knee sleeve or brace:  Wear the knee sleeve or brace as told by your doctor. Take it off only as told by your doctor. Loosen it if your toes: Tingle. Become numb. Turn cold and blue. Keep it clean. If the sleeve or brace is not waterproof: Do not let it get wet. Ask your doctor if you may take it off when you take a bath or shower. If not, cover it with a watertight covering.  Managing pain, stiffness, and swelling     If told, put heat on your knee. Do this as often as told by your doctor. Use the heat source that your doctor recommends, such as a moist heat pack or a heating pad. If you have a removable knee sleeve or brace, take it off as told by your doctor. Place a towel between your skin and the heat source. Leave the heat on for 20-30 minutes. Take off the heat if your skin turns bright red. This is very important. If you cannot feel pain, heat, or cold, you have a greater risk of getting burned. If told, put ice on your knee. To do this: If you have a removable knee sleeve or brace, take it off as told by your doctor. Put ice in a plastic bag. Place a towel between your skin and the bag. Leave the ice on for 20 minutes, 2-3 times a day. Take off the ice if your  skin turns bright red. This is very important. If you cannot feel pain, heat, or cold, you have a greater risk of damage to the area. Move your toes often. Raise the injured area above the level of your heart while you are sitting or lying down. Activity Avoid activities where both feet leave the ground at the same time (high-impact activities). Examples are running, jumping rope, and doing jumping jacks. Follow the exercise plan that your doctor makes for you. Your doctor may suggest that you: Avoid activities that make knee pain worse. You may need to change the exercises that you do, the sports that you participate in, or your job duties. Wear shoes with cushioned soles. Avoid sports that require running and sudden changes in direction. Do exercises or physical therapy. This is planned to match your needs and your abilities. Do exercises that increase your balance and strength, such as tai chi and yoga. Do not use your injured knee to support your body weight until your doctor says that you can. Use crutches as told by your doctor. Return to your normal activities when your doctor says that it is safe. General instructions Take over-the-counter and prescription medicines only as told by your doctor. If you are overweight, work with your doctor and a food expert (dietitian) to set goals to lose weight. Being overweight can make  your knee hurt more. Do not smoke or use any products that contain nicotine or tobacco. If you need help quitting, ask your doctor. Keep all follow-up visits. Contact a doctor if: You have knee pain that is not getting better or gets worse. You are not able to do your exercises due to knee pain. Get help right away if: Your knee swells and the swelling gets worse. You cannot move your knee. You have very bad knee pain. Summary Knee pain that lasts more than 3 months is called chronic knee pain. The main treatments for chronic knee pain are physical therapy and  weight loss. You may also need to take medicines, wear a knee sleeve or brace, use crutches, and put ice or heat on your knee. Lose weight if you are overweight. Work with your doctor and a food expert (dietitian) to help you set goals to lose weight. Being overweight can make your knee hurt more. Follow the exercise plan that your doctor makes for you. This information is not intended to replace advice given to you by your health care provider. Make sure you discuss any questions you have with your healthcare provider. Document Revised: 08/04/2019 Document Reviewed: 08/04/2019 Elsevier Patient Education  2022 [employer].

## 2020-10-12 ENCOUNTER — Ambulatory Visit (INDEPENDENT_AMBULATORY_CARE_PROVIDER_SITE_OTHER): Payer: 59 | Admitting: Rehabilitative and Restorative Service Providers"

## 2020-10-12 ENCOUNTER — Encounter: Payer: Self-pay | Admitting: Rehabilitative and Restorative Service Providers"

## 2020-10-12 ENCOUNTER — Other Ambulatory Visit: Payer: Self-pay

## 2020-10-12 DIAGNOSIS — M25561 Pain in right knee: Secondary | ICD-10-CM | POA: Diagnosis not present

## 2020-10-12 DIAGNOSIS — G8929 Other chronic pain: Secondary | ICD-10-CM

## 2020-10-12 DIAGNOSIS — M6281 Muscle weakness (generalized): Secondary | ICD-10-CM | POA: Diagnosis not present

## 2020-10-12 DIAGNOSIS — R262 Difficulty in walking, not elsewhere classified: Secondary | ICD-10-CM

## 2020-10-12 NOTE — Patient Instructions (Signed)
Access Code: [email or web address] URL: [email or web address] Date: 10/12/2020 Prepared by: Scot Jun  Exercises Seated Straight Leg Heel Taps - 2 x daily - 7 x weekly - 3 sets - 10 reps Sidelying Hip Abduction - 2 x daily - 7 x weekly - 3 sets - 10 reps Supine Bridge - 2 x daily - 7 x weekly - 10 reps - 3 sets - 2 hold Sit to Stand - 2 x daily - 7 x weekly - 3 sets - 10 reps

## 2020-10-12 NOTE — Therapy (Addendum)
[facility] [address] [place], [place], [address] Phone: [phone]   Fax:  [phone]  Physical Therapy Evaluation /Discharge   Patient Details  Name: Alexis Suarez MRN: [id number] Date of Birth: 22-Jul-1975 Referring Provider (PT): Nche, Charlene Brooke, NP   Encounter Date: 10/12/2020   PT End of Session - 10/12/20 0943     Visit Number 1    Number of Visits 20    Date for PT Re-Evaluation 12/21/20    Authorization Type [employer] no pre auth required    Progress Note Due on Visit 10    PT Start Time 0943    PT Stop Time 1011    PT Time Calculation (min) 28 min    Activity Tolerance Patient tolerated treatment well    Behavior During Therapy [facility] for tasks assessed/performed             Past Medical History:  Diagnosis Date   Asthma    ED eval in the past   Sciatica     Past Surgical History:  Procedure Laterality Date   ABDOMINAL HYSTERECTOMY  2015   partial   BREAST SURGERY  2000   breast reduction   CESAREAN SECTION  2012   DILATION AND CURETTAGE OF UTERUS     LIPOSUCTION  01/10/2019   REDUCTION MAMMAPLASTY Bilateral 2000   TONSILECTOMY/ADENOIDECTOMY WITH MYRINGOTOMY      There were no vitals filed for this visit.    Subjective Assessment - 10/12/20 0946     Subjective Chief complaint of Rt knee pain.  Pt. indicated she previously worked out routinely. Noticed having pain in Rt knee /ankle/leg after activity at times.  Pt. stated in last year she noticed having more pain c steps and felt giving way/weakness at times.  Pt. stated bedroom on second floor with laundry as well.    Patient Stated Goals Reduce pain, get stronger    Currently in Pain? No/denies    Pain Score 0-No pain   pain at worst 8/10   Pain Location Knee    Pain Orientation Right    Pain Descriptors / Indicators Sharp    Pain Type Chronic pain    Pain Onset More than a month ago    Pain Frequency Intermittent    Aggravating Factors   stairs, wearing heels, general weakness noted at times    Pain Relieving Factors OTC medicine                [facility] PT Assessment - 10/12/20 0001       Assessment   Medical Diagnosis Chronic Rt knee pain    Referring Provider (PT) Nche, Charlene Brooke, NP    Onset Date/Surgical Date 10/13/19    Hand Dominance Right      Precautions   Precautions None      Restrictions   Weight Bearing Restrictions No      Balance Screen   Has the patient fallen in the past 6 months No    Has the patient had a decrease in activity level because of a fear of falling?  No    Is the patient reluctant to leave their home because of a fear of falling?  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  Leisure Walking (limited now)      Observation/Other Assessments   Focus on Therapeutic Outcomes (FOTO)  intake 59 %, predicted 76%      Functional Tests   Functional tests Sit to Stand;Single leg stance      Single Leg Stance   Comments bilateral LE 20 seconds each      Sit to Stand   Comments 18 inch chair, no difficulty      ROM / Strength   AROM / PROM / Strength Strength;PROM;AROM      AROM   Overall AROM Comments bilateral knee AROM WFL no complaints    AROM Assessment Site Knee    Right/Left Knee Left;Right      PROM   Overall PROM Comments No complaints c overpressure    PROM Assessment Site Knee    Right/Left Knee Right;Left      Strength   Overall Strength Comments Mild pain c Rt knee extension MMT. Dynamometry recording in lbs in same positioning as MMT    Strength Assessment Site Hip;Knee;Ankle    Right/Left Hip Left;Right    Right Hip Flexion 5/5    Right Hip ABduction 4/5    Left Hip Flexion 5/5    Right/Left Knee Left;Right    Right Knee Flexion 5/5    Right Knee Extension 4/5   35.6, 38  lbs   Left Knee Flexion 5/5    Left Knee Extension 5/5   39.5, 41.2 lbs   Right/Left Ankle Left;Right    Right Ankle Dorsiflexion 5/5    Left Ankle Dorsiflexion 5/5      Palpation   Palpation comment No specific complaints      Special Tests   Other special tests Poor control in eccentric lowering on Rt leg      Ambulation/Gait   Gait Comments Independent, no complaints                        Objective measurements completed on examination: See above findings.       [facility] Adult PT Treatment/Exercise - 10/12/20 0001       Exercises   Exercises Other Exercises;Knee/Hip    Other Exercises  HEP instruction/performance c cues for techniques, handout provided.  Trial set performed of each for comprehension and symptom assessment.  Consiting of supine bridge, sidelying hip abduction, seated SLR, sit to stand eccentric control from chair                    PT Education - 10/12/20 0943     Education Details HEP, POC    Person(s) Educated Patient    Methods Explanation;Demonstration;Verbal cues;Handout    Comprehension Verbalized understanding;Returned demonstration              PT Short Term Goals - 10/12/20 0942       PT SHORT TERM GOAL #1   Title Patient will demonstrate independent use of home exercise program to maintain progress from in clinic treatments.    Time 3    Period Weeks    Status New    Target Date 11/02/20               PT Long Term Goals - 10/12/20 1014       PT LONG TERM GOAL #1   Title Patient will demonstrate/report pain at worst less than or equal to 2/10 to facilitate minimal limitation in daily activity secondary to pain symptoms.    Time 10  Period Weeks    Status New    Target Date 12/21/20      PT LONG TERM GOAL #2   Title Patient will demonstrate independent use of home exercise program to facilitate ability to maintain/progress functional gains from skilled physical therapy services.    Time 10     Period Weeks    Status New    Target Date 12/21/20      PT LONG TERM GOAL #3   Title Pt. will demonstrate FOTO outcome > or = 76% to indicated reduced disability due to condition.    Time 10    Period Weeks    Status New    Target Date 12/21/20      PT LONG TERM GOAL #4   Title Patient will demonstrate Rt LE MMT 5/5, knee extension dynamometry within 10 % of Lt to facilitate ability to perform usual standing, walking, stairs at PLOF s limitation due to symptoms.    Time 10    Period Weeks    Status New    Target Date 12/21/20      PT LONG TERM GOAL #5   Title Pt. will demonstrate descending eccentric control on Rt leg on stair normal without complaints for household navigation.    Time 10    Period Weeks    Status New    Target Date 12/21/20      Additional Long Term Goals   Additional Long Term Goals Yes      PT LONG TERM GOAL #6   Title Pt. will return to gym activity at PLOF s limitation.    Time 10    Period Weeks    Status New    Target Date 12/21/20                    Plan - 10/12/20 0941     Clinical Impression Statement Patient is a 45 y.o. who comes to clinic with complaints of Rt knee pain with mobility, strength and movement coordination deficits that impair their ability to perform usual daily and recreational functional activities without increase difficulty/symptoms at this time.  Patient to benefit from skilled PT services to address impairments and limitations to improve to previous level of function without restriction secondary to condition.    Examination-Activity Limitations Squat;Stairs;Carry;Stand;Lift    Examination-Participation Restrictions Other;Cleaning;Laundry;Community Activity   workouts   Stability/Clinical Decision Making Stable/Uncomplicated    Clinical Decision Making Low    Rehab Potential Good    PT Frequency --   1-2x/week   PT Duration Other (comment)   10 weeks   PT Treatment/Interventions ADLs/Self Care Home  Management;Cryotherapy;Electrical Stimulation;Iontophoresis 45m/ml Dexamethasone;Moist Heat;Balance training;Therapeutic exercise;Therapeutic activities;Functional mobility training;Stair training;Gait training;Ultrasound;Neuromuscular re-education;Patient/family education;Passive range of motion;Dry needling;Spinal Manipulations;Joint Manipulations;Taping;Vasopneumatic Device;Manual techniques    PT Next Visit Plan Progress quad strength, dynamic balance improvements (eccentric LAQ machine for gym )    PT Home Exercise Plan [phone]   Consulted and Agree with Plan of Care Patient             Patient will benefit from skilled therapeutic intervention in order to improve the following deficits and impairments:  Pain, Decreased strength, Decreased mobility, Difficulty walking, Decreased balance, Decreased coordination, Impaired perceived functional ability, Improper body mechanics, Decreased endurance  Visit Diagnosis: Chronic pain of right knee  Muscle weakness (generalized)  Difficulty in walking, not elsewhere classified     Problem List Patient Active Problem List   Diagnosis Date Noted   Chronic pain of  right knee 09/20/2020   H/O: hysterectomy 04/15/2015   Constipation 01/07/2014   Scot Jun, PT, DPT, OCS, ATC 10/12/20  10:17 AM  PHYSICAL THERAPY DISCHARGE SUMMARY  Visits from Start of Care: 1  Current functional level related to goals / functional outcomes: See note   Remaining deficits: See note   Education / Equipment: HEP   Patient agrees to discharge. Patient goals were not met. Patient is being discharged due to not returning since the last visit.  Scot Jun, PT, DPT, OCS, ATC 12/11/20  9:50 AM     [facility] [address] [place], [place], [address] Phone: [phone]   Fax:  [phone]  Name: Alexis Suarez MRN: [id number] Date of Birth: 06/26/1975

## 2020-10-18 ENCOUNTER — Encounter: Payer: 59 | Admitting: Physical Therapy

## 2020-10-26 ENCOUNTER — Encounter: Payer: 59 | Admitting: Rehabilitative and Restorative Service Providers"

## 2020-10-26 ENCOUNTER — Telehealth (INDEPENDENT_AMBULATORY_CARE_PROVIDER_SITE_OTHER): Payer: 59 | Admitting: Family Medicine

## 2020-10-26 ENCOUNTER — Encounter: Payer: Self-pay | Admitting: Family Medicine

## 2020-10-26 VITALS — Temp 98.0°F | Wt 211.0 lb

## 2020-10-26 DIAGNOSIS — R0981 Nasal congestion: Secondary | ICD-10-CM

## 2020-10-26 DIAGNOSIS — R059 Cough, unspecified: Secondary | ICD-10-CM

## 2020-10-26 MED ORDER — BENZONATATE 200 MG PO CAPS: 200.0000 mg | ORAL_CAPSULE | Freq: Two times a day (BID) | ORAL | 0 refills | Status: DC | PRN

## 2020-10-26 NOTE — Patient Instructions (Signed)
  HOME CARE TIPS:  -[place] testing information: [email or web address]/ OR [phone] Most pharmacies also offer testing and home test kits. If the Covid19 test is positive, please make a prompt follow up visit with your primary care office or with [facility] to discuss treatment options. Treatments for Covid19 are best given early in the course of the illness.   -I sent the medication(s) we discussed to your pharmacy: Meds ordered this encounter  Medications   benzonatate (TESSALON) 200 MG capsule    Sig: Take 1 capsule (200 mg total) by mouth 2 (two) times daily as needed for cough.    Dispense:  20 capsule    Refill:  0    -start allegra once daily and flonase 2 sprays each nostril daily for 2 weeks  -can use tylenol or aleve if needed for fevers, aches and pains per instructions  -can use nasal saline a few times per day if you have nasal congestion; sometimes  a short course of Afrin nasal spray for 3 days can help with symptoms as well  -stay hydrated, drink plenty of fluids and eat small healthy meals - avoid dairy  -can take 1000 IU (79mg) Vit D3 and 100-500 mg of Vit C daily per instructions  -If the Covid test is positive, check out the [facility] for more information on home care, transmission and treatment for COVID19  -follow up with your doctor in 2-3 days unless improving and feeling better  -stay home while sick, except to seek medical care. If you have COVID19, ideally it would be best to stay home for a full 10 days since the onset of symptoms PLUS one day of no fever and feeling better. Wear a good mask that fits snugly (such as N95 or KN95) if around others to reduce the risk of transmission.  It was nice to meet you today, and I really hope you are feeling better soon. I help [place] out with telemedicine visits on Tuesdays and Thursdays and am available for visits on those days. If you have any concerns or  questions following this visit please schedule a follow up visit with your Primary Care doctor or seek care at a local urgent care clinic to avoid delays in care.    Seek in person care or schedule a follow up video visit promptly if your symptoms worsen, new concerns arise or you are not improving with treatment. Call 911 and/or seek emergency care if your symptoms are severe or life threatening.

## 2020-10-26 NOTE — Progress Notes (Signed)
Virtual Visit via Video Note  I connected with Alexis Suarez  on 10/26/20 at  5:40 PM EDT by a video enabled telemedicine application and verified that I am speaking with the correct person using two identifiers.  Location patient: car, [place] Location provider:work or home office Persons participating in the virtual visit: patient, provider  I discussed the limitations of evaluation and management by telemedicine and the availability of in person appointments. The patient expressed understanding and agreed to proceed.   HPI:  Acute telemedicine visit for upper resp symptoms: -Onset: 2 days -had covid testing the day symptoms started which was negative -Symptoms include:nasal congestion, cough, sneezing, poor appetite -Denies:fever, CP, SOB, NVD, inability to eat/drink/get out of bed -someone at work was sick last week with covid -Has tried:mucinex and ibuprofen -Pertinent past medical history: see below - reports asthma was as a child -Pertinent medication allergies: No Known Allergies -denies any chance of pregnancy -COVID-19 vaccine status:2 doses  ROS: See pertinent positives and negatives per HPI.  Past Medical History:  Diagnosis Date   Asthma    ED eval in the past   Sciatica     Past Surgical History:  Procedure Laterality Date   ABDOMINAL HYSTERECTOMY  2015   partial   BREAST SURGERY  2000   breast reduction   CESAREAN SECTION  2012   DILATION AND CURETTAGE OF UTERUS     LIPOSUCTION  01/10/2019   REDUCTION MAMMAPLASTY Bilateral 2000   TONSILECTOMY/ADENOIDECTOMY WITH MYRINGOTOMY       Current Outpatient Medications:    benzonatate (TESSALON) 200 MG capsule, Take 1 capsule (200 mg total) by mouth 2 (two) times daily as needed for cough., Disp: 20 capsule, Rfl: 0   Phentermine-Topiramate (QSYMIA) 7.5-46 MG CP24, Take 1 tablet by mouth every morning., Disp: 30 capsule, Rfl: 0   pimecrolimus (ELIDEL) 1 % cream, Apply topically 2 (two) times daily., Disp: , Rfl:     triamcinolone cream (KENALOG) 0.1 %, Apply 1 application topically 2 (two) times daily., Disp: 30 g, Rfl: 0  EXAM:  VITALS per patient if applicable:  GENERAL: alert, oriented, appears well and in no acute distress  HEENT: atraumatic, conjunttiva clear, no obvious abnormalities on inspection of external nose and ears  NECK: normal movements of the head and neck  LUNGS: on inspection no signs of respiratory distress, breathing rate appears normal, no obvious gross SOB, gasping or wheezing  CV: no obvious cyanosis  MS: moves all visible extremities without noticeable abnormality  PSYCH/NEURO: pleasant and cooperative, no obvious depression or anxiety, speech and thought processing grossly intact  ASSESSMENT AND PLAN:  Discussed the following assessment and plan:  Nasal congestion  Cough  -we discussed possible serious and likely etiologies, options for evaluation and workup, limitations of telemedicine visit vs in person visit, treatment, treatment risks and precautions. Pt is agreeable to treatment via telemedicine at this moment.  Query viral upper respiratory illness, allergic rhinitis, COVID-19 with false negative early testing versus other.  She has opted to try Allegra, Flonase, Tessalon for cough and retesting for COVID.  Other symptomatic care measures summarized in patient instructions.   Advised to seek prompt in person care if worsening, new symptoms arise, or if is not improving with treatment. Discussed options for inperson care if PCP office not available.    I discussed the assessment and treatment plan with the patient. The patient was provided an opportunity to ask questions and all were answered. The patient agreed with the plan and demonstrated an  understanding of the instructions.     Lucretia Kern, DO

## 2020-11-02 ENCOUNTER — Ambulatory Visit: Payer: 59

## 2020-11-02 ENCOUNTER — Encounter: Payer: 59 | Admitting: Rehabilitative and Restorative Service Providers"

## 2020-11-03 ENCOUNTER — Ambulatory Visit: Payer: 59

## 2020-11-07 ENCOUNTER — Telehealth: Payer: Self-pay | Admitting: Rehabilitative and Restorative Service Providers"

## 2020-11-07 ENCOUNTER — Encounter: Payer: 59 | Admitting: Rehabilitative and Restorative Service Providers"

## 2020-11-07 NOTE — Telephone Encounter (Signed)
Called and left voice message about missed appointment today.  No additional visits scheduled at this time.  Scot Jun, PT, DPT, OCS, ATC 11/07/20  9:47 AM  w

## 2020-12-01 ENCOUNTER — Encounter: Payer: Self-pay | Admitting: General Surgery

## 2020-12-19 ENCOUNTER — Other Ambulatory Visit: Payer: Self-pay

## 2020-12-19 ENCOUNTER — Ambulatory Visit (INDEPENDENT_AMBULATORY_CARE_PROVIDER_SITE_OTHER): Payer: 59 | Admitting: Nurse Practitioner

## 2020-12-19 ENCOUNTER — Encounter: Payer: Self-pay | Admitting: Nurse Practitioner

## 2020-12-19 VITALS — BP 120/60 | Temp 97.3°F | Ht 68.0 in | Wt 218.0 lb

## 2020-12-19 DIAGNOSIS — E6609 Other obesity due to excess calories: Secondary | ICD-10-CM | POA: Diagnosis not present

## 2020-12-19 DIAGNOSIS — Z23 Encounter for immunization: Secondary | ICD-10-CM | POA: Diagnosis not present

## 2020-12-19 DIAGNOSIS — Z6833 Body mass index (BMI) 33.0-33.9, adult: Secondary | ICD-10-CM

## 2020-12-19 DIAGNOSIS — E669 Obesity, unspecified: Secondary | ICD-10-CM | POA: Insufficient documentation

## 2020-12-19 MED ORDER — QSYMIA 7.5-46 MG PO CP24: 1.0000 | ORAL_CAPSULE | ORAL | 0 refills | Status: DC

## 2020-12-19 NOTE — Progress Notes (Signed)
Subjective:  Patient ID: Alexis Suarez, female    DOB: 1975-05-04  Age: 45 y.o. MRN: [id number]  CC: Acute Visit (Pt would like to discuss weight loss options. Pt was previously taking Qsymia but needs new Rx./Flu vaccine given today/Declines Tdap vaccine. )  HPI  Class 1 obesity due to excess calories with body mass index (BMI) of 33.0 to 33.9 in adult Previous use of phentermine with no weight loss noted Lost 6pounds in 77months with Qsymia, last used 02/22 to 05/22. Denies any adverse side effects. Previous weight loss attempts 2021 with keto diet x 72months and low carb diet x 28months. Both attempts were combined with high intensity exercise regimen. She  Lost about 15lbs. No FHx of cardiomyopathy or CAD or CVA S/p hysterectomy, no tobacco use. Denies any hx of anxiety or depression. Current exercise regimen: walking 51mile 3x/week She has not made any dietary changes. Wt Readings from Last 3 Encounters:  12/19/20 218 lb (98.9 kg)  10/26/20 211 lb (95.7 kg)  09/20/20 216 lb (98 kg)   BP Readings from Last 3 Encounters:  12/19/20 120/60  09/20/20 100/64  06/09/20 122/74   Advised about potential side effects of Qsymia, use of qsymia for 3-32months only, need for monthly appts with me,  importance of incorporating lifestyle modifications (increase exercise and decrease daily caloric intake-provided printed material) Advised to use myfitnesspal app. She agreed to nutritionist referral. Order entered. Weight loss goal is 1lb per week. A total of 24lbs in 24months. Qsymia rx sent F/up in 15month  Reviewed past Medical, Social and Family history today.  Outpatient Medications Prior to Visit  Medication Sig Dispense Refill   benzonatate (TESSALON) 200 MG capsule Take 1 capsule (200 mg total) by mouth 2 (two) times daily as needed for cough. (Patient not taking: Reported on 12/19/2020) 20 capsule 0   Phentermine-Topiramate (QSYMIA) 7.5-46 MG CP24 Take 1 tablet by mouth every morning.  (Patient not taking: Reported on 12/19/2020) 30 capsule 0   pimecrolimus (ELIDEL) 1 % cream Apply topically 2 (two) times daily. (Patient not taking: Reported on 12/19/2020)     triamcinolone cream (KENALOG) 0.1 % Apply 1 application topically 2 (two) times daily. (Patient not taking: Reported on 12/19/2020) 30 g 0   No facility-administered medications prior to visit.    ROS See HPI  Objective:  BP 120/60 (BP Location: Right Arm, Patient Position: Sitting, Cuff Size: Normal)   Temp (!) 97.3 F (36.3 C) (Temporal)   Ht 5\' 8"  (1.727 m)   Wt 218 lb (98.9 kg)   LMP 04/09/2012   BMI 33.15 kg/m   Physical Exam  Assessment & Plan:  This visit occurred during the SARS-CoV-2 public health emergency.  Safety protocols were in place, including screening questions prior to the visit, additional usage of staff PPE, and extensive cleaning of exam room while observing appropriate contact time as indicated for disinfecting solutions.   [name] was seen today for acute visit.  Diagnoses and all orders for this visit:  Flu vaccine need -     Flu Vaccine QUAD 6+ mos PF IM (Fluarix Quad PF)  Class 1 obesity due to excess calories with body mass index (BMI) of 33.0 to 33.9 in adult, unspecified whether serious comorbidity present -     Phentermine-Topiramate (QSYMIA) 7.5-46 MG CP24; Take 1 tablet by mouth every morning. -     Amb ref to Medical Nutrition Therapy-MNT   Problem List Items Addressed This Visit  Other   Class 1 obesity due to excess calories with body mass index (BMI) of 33.0 to 33.9 in adult    Previous use of phentermine with no weight loss noted Lost 6pounds in 27months with Qsymia, last used 02/22 to 05/22. Denies any adverse side effects. Previous weight loss attempts 2021 with keto diet x 25months and low carb diet x 32months. Both attempts were combined with high intensity exercise regimen. She  Lost about 15lbs. No FHx of cardiomyopathy or CAD or CVA S/p  hysterectomy, no tobacco use. Denies any hx of anxiety or depression. Current exercise regimen: walking 3mile 3x/week She has not made any dietary changes. Wt Readings from Last 3 Encounters:  12/19/20 218 lb (98.9 kg)  10/26/20 211 lb (95.7 kg)  09/20/20 216 lb (98 kg)   BP Readings from Last 3 Encounters:  12/19/20 120/60  09/20/20 100/64  06/09/20 122/74  Advised about potential side effects of Qsymia, use of qsymia for 3-43months only, need for monthly appts with me,  importance of incorporating lifestyle modifications (increase exercise and decrease daily caloric intake-provided printed material) Advised to use myfitnesspal app. She agreed to nutritionist referral. Order entered. Weight loss goal is 1lb per week. A total of 24lbs in 75months. Qsymia rx sent F/up in 48month      Relevant Medications   Phentermine-Topiramate (QSYMIA) 7.5-46 MG CP24   Other Relevant Orders   Amb ref to Medical Nutrition Therapy-MNT   Other Visit Diagnoses     Flu vaccine need    -  Primary   Relevant Orders   Flu Vaccine QUAD 6+ mos PF IM (Fluarix Quad PF) (Completed)       I have spent 39mins with this patient regarding history taking, documentation, review of labs, formulating plan and discussing treatment options with patient.  Follow-up: Return in about 4 weeks (around 01/16/2021) for weight management.  Wilfred Lacy, NP

## 2020-12-19 NOTE — Patient Instructions (Addendum)
Use myfitnesspal app. You will be contacted to schedule appt with nutritionist. Weight loss goal is to loss 1lb per week. A total of 24lbs in 28months.  Calorie Counting for Weight Loss Calories are units of energy. Your body needs a certain number of calories from food to keep going throughout the day. When you eat or drink more calories than your body needs, your body stores the extra calories mostly as fat. When you eat or drink fewer calories than your body needs, your body burns fat to get the energy it needs. Calorie counting means keeping track of how many calories you eat and drink each day. Calorie counting can be helpful if you need to lose weight. If you eat fewer calories than your body needs, you should lose weight. Ask your health care provider what a healthy weight is for you. For calorie counting to work, you will need to eat the right number of calories each day to lose a healthy amount of weight per week. A dietitian can help you figure out how many calories you need in a day and will suggest ways to reach your calorie goal. A healthy amount of weight to lose each week is usually 1-2 lb (0.5-0.9 kg). This usually means that your daily calorie intake should be reduced by 500-750 calories. Eating 1,200-1,500 calories a day can help most women lose weight. Eating 1,500-1,800 calories a day can help most men lose weight. What do I need to know about calorie counting? Work with your health care provider or dietitian to determine how many calories you should get each day. To meet your daily calorie goal, you will need to: Find out how many calories are in each food that you would like to eat. Try to do this before you eat. Decide how much of the food you plan to eat. Keep a food log. Do this by writing down what you ate and how many calories it had. To successfully lose weight, it is important to balance calorie counting with a healthy lifestyle that includes regular activity. Where do I  find calorie information? The number of calories in a food can be found on a Nutrition Facts label. If a food does not have a Nutrition Facts label, try to look up the calories online or ask your dietitian for help. Remember that calories are listed per serving. If you choose to have more than one serving of a food, you will have to multiply the calories per serving by the number of servings you plan to eat. For example, the label on a package of bread might say that a serving size is 1 slice and that there are 90 calories in a serving. If you eat 1 slice, you will have eaten 90 calories. If you eat 2 slices, you will have eaten 180 calories. How do I keep a food log? After each time that you eat, record the following in your food log as soon as possible: What you ate. Be sure to include toppings, sauces, and other extras on the food. How much you ate. This can be measured in cups, ounces, or number of items. How many calories were in each food and drink. The total number of calories in the food you ate. Keep your food log near you, such as in a pocket-sized notebook or on an app or website on your mobile phone. Some programs will calculate calories for you and show you how many calories you have left to meet your daily goal.  What are some portion-control tips? Know how many calories are in a serving. This will help you know how many servings you can have of a certain food. Use a measuring cup to measure serving sizes. You could also try weighing out portions on a kitchen scale. With time, you will be able to estimate serving sizes for some foods. Take time to put servings of different foods on your favorite plates or in your favorite bowls and cups so you know what a serving looks like. Try not to eat straight from a food's packaging, such as from a bag or box. Eating straight from the package makes it hard to see how much you are eating and can lead to overeating. Put the amount you would like to eat  in a cup or on a plate to make sure you are eating the right portion. Use smaller plates, glasses, and bowls for smaller portions and to prevent overeating. Try not to multitask. For example, avoid watching TV or using your computer while eating. If it is time to eat, sit down at a table and enjoy your food. This will help you recognize when you are full. It will also help you be more mindful of what and how much you are eating. What are tips for following this plan? Reading food labels Check the calorie count compared with the serving size. The serving size may be smaller than what you are used to eating. Check the source of the calories. Try to choose foods that are high in protein, fiber, and vitamins, and low in saturated fat, trans fat, and sodium. Shopping Read nutrition labels while you shop. This will help you make healthy decisions about which foods to buy. Pay attention to nutrition labels for low-fat or fat-free foods. These foods sometimes have the same number of calories or more calories than the full-fat versions. They also often have added sugar, starch, or salt to make up for flavor that was removed with the fat. Make a grocery list of lower-calorie foods and stick to it. Cooking Try to cook your favorite foods in a healthier way. For example, try baking instead of frying. Use low-fat dairy products. Meal planning Use more fruits and vegetables. One-half of your plate should be fruits and vegetables. Include lean proteins, such as chicken, [place], and fish. Lifestyle Each week, aim to do one of the following: 150 minutes of moderate exercise, such as walking. 75 minutes of vigorous exercise, such as running. General information Know how many calories are in the foods you eat most often. This will help you calculate calorie counts faster. Find a way of tracking calories that works for you. Get creative. Try different apps or programs if writing down calories does not work for  you. What foods should I eat?  Eat nutritious foods. It is better to have a nutritious, high-calorie food, such as an avocado, than a food with few nutrients, such as a bag of potato chips. Use your calories on foods and drinks that will fill you up and will not leave you hungry soon after eating. Examples of foods that fill you up are nuts and nut butters, vegetables, lean proteins, and high-fiber foods such as whole grains. High-fiber foods are foods with more than 5 g of fiber per serving. Pay attention to calories in drinks. Low-calorie drinks include water and unsweetened drinks. The items listed above may not be a complete list of foods and beverages you can eat. Contact a dietitian for more information.  What foods should I limit? Limit foods or drinks that are not good sources of vitamins, minerals, or protein or that are high in unhealthy fats. These include: Candy. Other sweets. Sodas, specialty coffee drinks, alcohol, and juice. The items listed above may not be a complete list of foods and beverages you should avoid. Contact a dietitian for more information. How do I count calories when eating out? Pay attention to portions. Often, portions are much larger when eating out. Try these tips to keep portions smaller: Consider sharing a meal instead of getting your own. If you get your own meal, eat only half of it. Before you start eating, ask for a container and put half of your meal into it. When available, consider ordering smaller portions from the menu instead of full portions. Pay attention to your food and drink choices. Knowing the way food is cooked and what is included with the meal can help you eat fewer calories. If calories are listed on the menu, choose the lower-calorie options. Choose dishes that include vegetables, fruits, whole grains, low-fat dairy products, and lean proteins. Choose items that are boiled, broiled, grilled, or steamed. Avoid items that are buttered,  battered, fried, or served with cream sauce. Items labeled as crispy are usually fried, unless stated otherwise. Choose water, low-fat milk, unsweetened iced tea, or other drinks without added sugar. If you want an alcoholic beverage, choose a lower-calorie option, such as a glass of wine or light beer. Ask for dressings, sauces, and syrups on the side. These are usually high in calories, so you should limit the amount you eat. If you want a salad, choose a garden salad and ask for grilled meats. Avoid extra toppings such as bacon, cheese, or fried items. Ask for the dressing on the side, or ask for olive oil and vinegar or lemon to use as dressing. Estimate how many servings of a food you are given. Knowing serving sizes will help you be aware of how much food you are eating at restaurants. Where to find more information Centers for Disease Control and Prevention: http://www.wolf.info/ U.S. Department of Agriculture: http://www.wilson-mendoza.org/ Summary Calorie counting means keeping track of how many calories you eat and drink each day. If you eat fewer calories than your body needs, you should lose weight. A healthy amount of weight to lose per week is usually 1-2 lb (0.5-0.9 kg). This usually means reducing your daily calorie intake by 500-750 calories. The number of calories in a food can be found on a Nutrition Facts label. If a food does not have a Nutrition Facts label, try to look up the calories online or ask your dietitian for help. Use smaller plates, glasses, and bowls for smaller portions and to prevent overeating. Use your calories on foods and drinks that will fill you up and not leave you hungry shortly after a meal. This information is not intended to replace advice given to you by your health care provider. Make sure you discuss any questions you have with your health care provider. Document Revised: 04/01/2019 Document Reviewed: 04/01/2019 Elsevier Patient Education  2022 [place].  How to Increase  Your Level of Physical Activity Getting regular physical activity is important for your overall health and well-being. Most people do not get enough exercise. There are easy ways to increase your level of physical activity, even if you have not been very active in the past or if you are just starting out. What are the benefits of physical activity? Physical activity  has many short-term and long-term benefits. Being active on a regular basis can improve your physical and mental health as well as provide other benefits. Physical health benefits Helping you lose weight or maintain a healthy weight. Strengthening your muscles and bones. Reducing your risk of certain long-term (chronic) diseases, including heart disease, cancer, and diabetes. Being able to move around more easily and for longer periods of time without getting tired (increased endurance or stamina). Improving your ability to fight off illness (enhanced immunity). Being able to sleep better. Helping you stay healthy as you get older, including: Helping you stay mobile, or capable of walking and moving around. Preventing accidents, such as falls. Increasing life expectancy. Mental health benefits Boosting your mood and improving your self-esteem. Lowering your chance of having mental health problems, such as depression or anxiety. Helping you feel good about your body. Other benefits Finding new sources of fun and enjoyment. Meeting new people who share a common interest. Before you begin If you have a chronic illness or have not been active for a while, check with your health care provider about how to get started. Ask your health care provider what activities are safe for you. Start out slowly. Walking or doing some simple chair exercises is a good place to start, especially if you have not been active before or for a long time. Set goals that you can work toward. Ask your health care provider how much exercise is best for you. In  general, most adults should: Do moderate-intensity exercise for at least 150 minutes each week (30 minutes on most days of the week) or vigorous exercise for at least 75 minutes each week, or a combination of these. Moderate-intensity exercise can include walking at a quick pace, biking, yoga, water aerobics, or gardening. Vigorous exercise involves activities that take more effort, such as jogging or running, playing sports, swimming laps, or jumping rope. Do strength exercises on at least 2 days each week. This can include weight lifting, body weight exercises, and resistance-band exercises. How to be more physically active Make a plan  Try to find activities that you enjoy. You are more likely to commit to an exercise routine if it does not feel like a chore. If you have bone or joint problems, choose low-impact exercises, like walking or swimming. Use these tips for being successful with an exercise plan: Find a workout partner for accountability. Join a group or class, such as an aerobics class, cycling class, or sports team. Make family time active. Go for a walk, bike, or swim. Include a variety of exercises each week. Consider using a fitness tracker, such as a mobile phone app or a device worn like a watch, that will count the number of steps you take each day. Many people strive to reach 10,000 steps a day. Find ways to be active in your daily routines Besides your formal exercise plans, you can find ways to do physical activity during your daily routines, such as: Walking or biking to work or to the store. Taking the stairs instead of the elevator. Parking farther away from the door at work or at the store. Planning walking meetings. Walking around while you are on the phone. Where to find more information Centers for Disease Control and Prevention: WorkDashboard.es President's Council on Fitness, Sports & Nutrition: www.fitness.gov ChooseMyPlate:  MassVoice.es Contact a health care provider if: You have headaches, muscle aches, or joint pain that is concerning. You feel dizzy or light-headed while exercising. You faint. You  feel your heart skipping, racing, or fluttering. You have chest pain while exercising. Summary Exercise benefits your mind and body at any age, even if you are just starting out. If you have a chronic illness or have not been active for a while, check with your health care provider before increasing your physical activity. Choose activities that are safe and enjoyable for you. Ask your health care provider what activities are safe for you. Start slowly. Tell your health care provider if you have problems as you start to increase your activity level. This information is not intended to replace advice given to you by your health care provider. Make sure you discuss any questions you have with your health care provider. Document Revised: 06/16/2020 Document Reviewed: 06/16/2020 Elsevier Patient Education  [place].

## 2020-12-19 NOTE — Assessment & Plan Note (Addendum)
Previous use of phentermine with no weight loss noted Lost 6pounds in 59months with Qsymia, last used 02/22 to 05/22. Denies any adverse side effects. Previous weight loss attempts 2021 with keto diet x 42months and low carb diet x 18months. Both attempts were combined with high intensity exercise regimen. She  Lost about 15lbs. No FHx of cardiomyopathy or CAD or CVA S/p hysterectomy, no tobacco use. Denies any hx of anxiety or depression. Current exercise regimen: walking 19mile 3x/week She has not made any dietary changes. Wt Readings from Last 3 Encounters:  12/19/20 218 lb (98.9 kg)  10/26/20 211 lb (95.7 kg)  09/20/20 216 lb (98 kg)   BP Readings from Last 3 Encounters:  12/19/20 120/60  09/20/20 100/64  06/09/20 122/74   Advised about potential side effects of Qsymia, use of qsymia for 3-1months only, need for monthly appts with me,  importance of incorporating lifestyle modifications (increase exercise and decrease daily caloric intake-provided printed material) Advised to use myfitnesspal app. She agreed to nutritionist referral. Order entered. Weight loss goal is 1lb per week. A total of 24lbs in 62months. Qsymia rx sent F/up in 66month

## 2021-01-12 ENCOUNTER — Encounter: Payer: Self-pay | Admitting: Nurse Practitioner

## 2021-01-12 ENCOUNTER — Other Ambulatory Visit: Payer: Self-pay

## 2021-01-12 ENCOUNTER — Other Ambulatory Visit (HOSPITAL_COMMUNITY)
Admission: RE | Admit: 2021-01-12 | Discharge: 2021-01-12 | Disposition: A | Discharge status: Home / Self Care | Payer: 59 | Source: Ambulatory Visit | Attending: Nurse Practitioner | Admitting: Nurse Practitioner

## 2021-01-12 ENCOUNTER — Ambulatory Visit (INDEPENDENT_AMBULATORY_CARE_PROVIDER_SITE_OTHER): Payer: 59 | Admitting: Nurse Practitioner

## 2021-01-12 VITALS — BP 116/68 | HR 68 | Temp 97.8°F | Wt 215.0 lb

## 2021-01-12 DIAGNOSIS — N760 Acute vaginitis: Secondary | ICD-10-CM

## 2021-01-12 DIAGNOSIS — Z6833 Body mass index (BMI) 33.0-33.9, adult: Secondary | ICD-10-CM

## 2021-01-12 DIAGNOSIS — E6609 Other obesity due to excess calories: Secondary | ICD-10-CM

## 2021-01-12 MED ORDER — QSYMIA 11.25-69 MG PO CP24: 1.0000 | ORAL_CAPSULE | Freq: Every morning | ORAL | 0 refills | Status: DC

## 2021-01-12 NOTE — Assessment & Plan Note (Addendum)
Denies any adverse effects with qsymia Exercise: cardio and weight training 3x/week Diet: decreased portion size and healthy choices 2lbs weight loss in last 3weeks. Wt Readings from Last 3 Encounters:  01/12/21 215 lb (97.5 kg)  12/19/20 218 lb (98.9 kg)  10/26/20 211 lb (95.7 kg)    increase dose to 11/96mg  F/up in 3months

## 2021-01-12 NOTE — Progress Notes (Signed)
Subjective:  Patient ID: Alexis Suarez, female    DOB: 07-13-1975  Age: 45 y.o. MRN: [id number]  CC: Acute Visit (Pt c/o possible yeast infection, white milky discharge x 3 days. Vicente Males itching, burning, or pain. Pt has not tried any otc medications. )  Vaginal Discharge The patient's primary symptoms include a genital odor and vaginal discharge. The patient's pertinent negatives include no genital itching, genital lesions, genital rash, missed menses, pelvic pain or vaginal bleeding. This is a new problem. The current episode started in the past 7 days. The problem occurs constantly. The problem has been unchanged. The patient is experiencing no pain. She is not pregnant. Pertinent negatives include no abdominal pain, back pain, chills, discolored urine, frequency, hematuria, painful intercourse or urgency. The vaginal discharge was milky and thick. There has been no bleeding. She has not been passing clots. She has not been passing tissue. Nothing aggravates the symptoms. She has tried nothing for the symptoms. She is sexually active. It is unknown whether or not her partner has an STD. She uses hysterectomy for contraception. There is no history of PID, an STD or vaginosis.   Class 1 obesity due to excess calories with body mass index (BMI) of 33.0 to 33.9 in adult Denies any adverse effects with qsymia Exercise: cardio and weight training 3x/week Diet: decreased portion size and healthy choices 2lbs weight loss in last 3weeks. Wt Readings from Last 3 Encounters:  01/12/21 215 lb (97.5 kg)  12/19/20 218 lb (98.9 kg)  10/26/20 211 lb (95.7 kg)    increase dose to 11/96mg  F/up in 2months  BP Readings from Last 3 Encounters:  01/12/21 116/68  12/19/20 120/60  09/20/20 100/64    Reviewed past Medical, Social and Family history today.  Outpatient Medications Prior to Visit  Medication Sig Dispense Refill   Phentermine-Topiramate (QSYMIA) 7.5-46 MG CP24 Take 1 tablet by mouth every  morning. 30 capsule 0   No facility-administered medications prior to visit.   ROS See HPI  Objective:  BP 116/68 (BP Location: Left Arm, Patient Position: Sitting, Cuff Size: Large)   Pulse 68   Temp 97.8 F (36.6 C) (Temporal)   Wt 215 lb (97.5 kg)   LMP 04/09/2012   SpO2 100%   BMI 32.69 kg/m   Physical Exam Vitals and nursing note reviewed. Exam conducted with a chaperone present.  Constitutional:      Appearance: She is obese.  Cardiovascular:     Rate and Rhythm: Normal rate.     Pulses: Normal pulses.  Pulmonary:     Effort: Pulmonary effort is normal.  Abdominal:     Hernia: There is no hernia in the left inguinal area or right inguinal area.  Genitourinary:    Exam position: Lithotomy position.     Labia:        Right: No rash or tenderness.        Left: No rash or tenderness.      Vagina: Vaginal discharge present. No erythema, tenderness, bleeding or lesions.     Adnexa: Right adnexa normal and left adnexa normal.     Comments: Cervix absent. S/p hysterectomy Lymphadenopathy:     Lower Body: No right inguinal adenopathy. No left inguinal adenopathy.  Neurological:     Mental Status: She is alert and oriented to person, place, and time.    Assessment & Plan:  This visit occurred during the SARS-CoV-2 public health emergency.  Safety protocols were in place, including screening questions prior to  the visit, additional usage of staff PPE, and extensive cleaning of exam room while observing appropriate contact time as indicated for disinfecting solutions.   Faline was seen today for acute visit.  Diagnoses and all orders for this visit:  Acute vaginitis -     Cervicovaginal ancillary only( [place])  Class 1 obesity due to excess calories with body mass index (BMI) of 33.0 to 33.9 in adult, unspecified whether serious comorbidity present -     Phentermine-Topiramate (QSYMIA) 11.25-69 MG CP24; Take 1 tablet by mouth in the morning. With  breakfast   Problem List Items Addressed This Visit       Other   Class 1 obesity due to excess calories with body mass index (BMI) of 33.0 to 33.9 in adult    Denies any adverse effects with qsymia Exercise: cardio and weight training 3x/week Diet: decreased portion size and healthy choices 2lbs weight loss in last 3weeks. Wt Readings from Last 3 Encounters:  01/12/21 215 lb (97.5 kg)  12/19/20 218 lb (98.9 kg)  10/26/20 211 lb (95.7 kg)    increase dose to 11/96mg  F/up in 100months      Relevant Medications   Phentermine-Topiramate (QSYMIA) 11.25-69 MG CP24 (Start on 01/19/2021)   Other Visit Diagnoses     Acute vaginitis    -  Primary   Relevant Orders   Cervicovaginal ancillary only( [place])       Follow-up: Return in about 4 weeks (around 02/09/2021) for weight management.  Wilfred Lacy, NP

## 2021-01-15 LAB — CERVICOVAGINAL ANCILLARY ONLY
Bacterial Vaginitis (gardnerella): NEGATIVE
Candida Glabrata: NEGATIVE
Candida Vaginitis: NEGATIVE
Chlamydia: NEGATIVE
Comment: NEGATIVE
Comment: NEGATIVE
Comment: NEGATIVE
Comment: NEGATIVE
Comment: NEGATIVE
Comment: NORMAL
Neisseria Gonorrhea: NEGATIVE
Trichomonas: NEGATIVE

## 2021-01-18 ENCOUNTER — Ambulatory Visit: Payer: 59 | Admitting: Nurse Practitioner

## 2021-03-06 ENCOUNTER — Ambulatory Visit: Payer: Self-pay | Admitting: Nurse Practitioner

## 2021-03-06 ENCOUNTER — Ambulatory Visit: Payer: 59 | Admitting: Skilled Nursing Facility1

## 2021-04-02 ENCOUNTER — Encounter: Payer: Self-pay | Admitting: Nurse Practitioner

## 2021-04-03 ENCOUNTER — Telehealth: Payer: Self-pay | Admitting: Nurse Practitioner

## 2021-04-03 NOTE — Telephone Encounter (Signed)
Received mychart regarding this and sent message to provider. Will contact the patient once the provider has responded.

## 2021-04-03 NOTE — Telephone Encounter (Signed)
Pt is wanting a call back concerning lab work her partner has recently gotten back. She is concerned she may have this. Please advise pt at [phone].

## 2021-04-06 ENCOUNTER — Other Ambulatory Visit: Payer: BC Managed Care – PPO

## 2021-04-11 ENCOUNTER — Ambulatory Visit: Payer: 59 | Admitting: Registered"

## 2021-05-03 ENCOUNTER — Other Ambulatory Visit: Payer: Self-pay | Admitting: Nurse Practitioner

## 2021-05-03 DIAGNOSIS — Z6833 Body mass index (BMI) 33.0-33.9, adult: Secondary | ICD-10-CM

## 2021-05-03 DIAGNOSIS — E6609 Other obesity due to excess calories: Secondary | ICD-10-CM

## 2021-05-03 NOTE — Telephone Encounter (Signed)
LVM for patient to return call ?Want to confirm patient is requesting this medication.  ?

## 2021-05-04 ENCOUNTER — Other Ambulatory Visit: Payer: Self-pay | Admitting: Nurse Practitioner

## 2021-05-04 DIAGNOSIS — E6609 Other obesity due to excess calories: Secondary | ICD-10-CM

## 2021-05-04 DIAGNOSIS — E66811 Obesity, class 1: Secondary | ICD-10-CM

## 2021-05-31 NOTE — Telephone Encounter (Signed)
Spoke with patient, states she did request medication but she will discuss with provider at her appointment on 06/04/21. Pt states she has new insurance and will bring that information in also.  ?

## 2021-06-04 ENCOUNTER — Encounter: Payer: BC Managed Care – PPO | Admitting: Nurse Practitioner

## 2021-06-04 NOTE — Progress Notes (Deleted)
? ?               Established Patient Visit ? ?Patient: Alexis Suarez   DOB: [birth year]   46 y.o. Female  MRN: [id number] ?Visit Date: 06/04/2021 ? ?Subjective:  ?  ?No chief complaint on file. ? ?HPI ?No problem-specific Assessment & Plan notes found for this encounter. ? ?Wt Readings from Last 3 Encounters:  ?01/12/21 215 lb (97.5 kg)  ?12/19/20 218 lb (98.9 kg)  ?10/26/20 211 lb (95.7 kg)  ?  ?Reviewed medical, surgical, and social history today ? ?Medications: ?Outpatient Medications Prior to Visit  ?Medication Sig  ? Phentermine-Topiramate (QSYMIA) 11.25-69 MG CP24 Take 1 tablet by mouth in the morning. With breakfast  ? ?No facility-administered medications prior to visit.  ? ?Reviewed past medical and social history.  ? ?ROS per HPI above ? ?{Show previous labs (optional):23779} ?   ?Objective:  ?LMP 04/09/2012  ? ?  ? ?Physical Exam  ?No results found for any visits on 06/04/21. ?   ?Assessment & Plan:  ?  ?Problem List Items Addressed This Visit   ?None ? ?No follow-ups on file. ? ?  ? ?Wilfred Lacy, NP ? ? ?

## 2021-06-05 NOTE — Progress Notes (Signed)
This encounter was created in error - please disregard. ?Not seen ?

## 2021-06-07 ENCOUNTER — Encounter: Payer: Self-pay | Admitting: Nurse Practitioner

## 2021-06-07 ENCOUNTER — Telehealth (INDEPENDENT_AMBULATORY_CARE_PROVIDER_SITE_OTHER): Payer: BC Managed Care – PPO | Admitting: Nurse Practitioner

## 2021-06-07 VITALS — Wt 206.0 lb

## 2021-06-07 DIAGNOSIS — Z6831 Body mass index (BMI) 31.0-31.9, adult: Secondary | ICD-10-CM

## 2021-06-07 DIAGNOSIS — E6609 Other obesity due to excess calories: Secondary | ICD-10-CM | POA: Diagnosis not present

## 2021-06-07 NOTE — Progress Notes (Signed)
Virtual Visit via Video Note ? ?I connected withNAME@ on 06/07/21 at  9:20 AM EDT by a video enabled telemedicine application and verified that I am speaking with the correct person using two identifiers. ? ?Location: ?Patient:Home ?Provider: Office ?Participants: patient and provider ? ?I discussed the limitations of evaluation and management by telemedicine and the availability of in person appointments. ?I also discussed with the patient that there may be a patient responsible charge related to this service. The patient expressed understanding and agreed to proceed. ? ?MC:NOBSJG management ? ?History of Present Illness: ?Obesity ? started Qsymia 11.25/'69mg'$  2weeks ago. Denies any adverse side effects. ?Lost 9lbs in last 57month ?Exercise: personal trainer 2x/week (cardio and weight training) walking 164me 2x/week ?Diet:admits to being inconsistent with amount of carbs and meal portions. Denies need for referral to nutritionist. ?Wt Readings from Last 3 Encounters:  ?06/07/21 215 lb (97.5 kg)  ?01/12/21 215 lb (97.5 kg)  ?12/19/20 218 lb (98.9 kg)  ? ?Advised about the importance of diet modification and consistency. ?Maintain current dose of qsymia ?F/up in 7m32month  ?Observations/Objective: ?Physical Exam ?Vitals reviewed.  ?Cardiovascular:  ?   Rate and Rhythm: Normal rate.  ?   Pulses: Normal pulses.  ?Pulmonary:  ?   Effort: Pulmonary effort is normal.  ?Musculoskeletal:  ?   Right lower leg: No edema.  ?   Left lower leg: No edema.  ?Neurological:  ?   Mental Status: She is alert and oriented to person, place, and time.  ?Psychiatric:     ?   Mood and Affect: Mood normal.     ?   Behavior: Behavior normal.     ?   Thought Content: Thought content normal.  ?  ?Assessment and Plan: ?ValLauris seen today for medication refill. ? ?Diagnoses and all orders for this visit: ? ?Class 1 obesity due to excess calories without serious comorbidity with body mass index (BMI) of 31.0 to 31.9 in adult ? ?Send BP reading  via mychart. ? ?Follow Up Instructions: ?See instructions above ?  ?I discussed the assessment and treatment plan with the patient. The patient was provided an opportunity to ask questions and all were answered. The patient agreed with the plan and demonstrated an understanding of the instructions. ?  ?The patient was advised to call back or seek an in-person evaluation if the symptoms worsen or if the condition fails to improve as anticipated. ? ?ChaWilfred LacyP  ?

## 2021-06-07 NOTE — Assessment & Plan Note (Addendum)
started Qsymia 11.25/'69mg'$  2weeks ago. Denies any adverse side effects. ?Lost 9lbs in last 74month ?Exercise: personal trainer 2x/week (cardio and weight training) walking 143me 2x/week ?Diet:admits to being inconsistent with amount of carbs and meal portions. Denies need for referral to nutritionist. ?Wt Readings from Last 3 Encounters:  ?06/07/21 215 lb (97.5 kg)  ?01/12/21 215 lb (97.5 kg)  ?12/19/20 218 lb (98.9 kg)  ? ?Advised about the importance of diet modification and consistency. ?Maintain current dose of qsymia ?F/up in 49m69month

## 2021-07-05 ENCOUNTER — Encounter: Payer: Self-pay | Admitting: Nurse Practitioner

## 2021-07-16 ENCOUNTER — Encounter: Payer: Self-pay | Admitting: Nurse Practitioner

## 2021-08-08 ENCOUNTER — Encounter: Payer: BC Managed Care – PPO | Admitting: Nurse Practitioner

## 2021-08-21 ENCOUNTER — Other Ambulatory Visit: Payer: Self-pay | Admitting: Nurse Practitioner

## 2021-08-21 ENCOUNTER — Encounter: Payer: Self-pay | Admitting: Nurse Practitioner

## 2021-08-21 DIAGNOSIS — E6609 Other obesity due to excess calories: Secondary | ICD-10-CM

## 2021-09-03 ENCOUNTER — Ambulatory Visit: Payer: BC Managed Care – PPO | Admitting: Internal Medicine

## 2021-09-03 NOTE — Progress Notes (Deleted)
Subjective:    Patient ID: Alexis Suarez, female    DOB: 09-14-1975, 46 y.o.   MRN: [id number]  HPI  Patient presents to clinic today to reestablish care and for management of the conditions listed below.  Chronic Right Knee Pain: She is currently not taking any medications for this.  She does not follow with orthopedics.  Review of Systems     Past Medical History:  Diagnosis Date   Asthma    ED eval in the past   Sciatica     Current Outpatient Medications  Medication Sig Dispense Refill   Phentermine-Topiramate (QSYMIA) 11.25-69 MG CP24 Take 1 tablet by mouth in the morning. With breakfast 30 capsule 0   No current facility-administered medications for this visit.    No Known Allergies  Family History  Problem Relation Age of Onset   Thyroid disease Mother    Hypertension Sister    65 / [place] Sister    Stroke Sister    Diabetes Father    Prostate cancer Paternal Grandfather    Cancer Paternal Grandfather        Prostate    Social History   Socioeconomic History   Marital status: Single    Spouse name: Not on file   Number of children: 1   Years of education: Not on file   Highest education level: Not on file  Occupational History   Not on file  Tobacco Use   Smoking status: Never   Smokeless tobacco: Never  Vaping Use   Vaping Use: Never used  Substance and Sexual Activity   Alcohol use: Yes    Alcohol/week: 0.0 standard drinks of alcohol    Comment: Rare   Drug use: No   Sexual activity: Yes    Partners: Male  Other Topics Concern   Not on file  Social History Narrative   Lives in [place] with mother, son and boyfriend in a one story home. 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      Education: college degree and 1 year of grad school.      Diet - regular diet            Social Determinants of Health   Financial Resource Strain: Low Risk  (06/17/2018)   Overall Financial Resource Strain (CARDIA)    Difficulty of Paying  Living Expenses: Not hard at all  Food Insecurity: No Food Insecurity (06/17/2018)   Hunger Vital Sign    Worried About Running Out of Food in the Last Year: Never true    Ran Out of Food in the Last Year: Never true  Transportation Needs: No Transportation Needs (06/17/2018)   PRAPARE - Hydrologist (Medical): No    Lack of Transportation (Non-Medical): No  Physical Activity: Sufficiently Active (06/17/2018)   Exercise Vital Sign    Days of Exercise per Week: 5 days    Minutes of Exercise per Session: 50 min  Stress: No Stress Concern Present (06/17/2018)   [place]    Feeling of Stress : Not at all  Social Connections: Moderately Integrated (06/17/2018)   Social Connection and Isolation Panel [NHANES]    Frequency of Communication with Friends and Family: More than three times a week    Frequency of Social Gatherings with Friends and Family: More than three times a week    Attends Religious Services: More than 4 times per year    Active Member of  Clubs or Organizations: Yes    Attends Archivist Meetings: More than 4 times per year    Marital Status: Never married  Intimate Partner Violence: Not At Risk (06/17/2018)   Humiliation, Afraid, Rape, and Kick questionnaire    Fear of Current or Ex-Partner: No    Emotionally Abused: No    Physically Abused: No    Sexually Abused: No     Constitutional: Denies fever, malaise, fatigue, headache or abrupt weight changes.  HEENT: Denies eye pain, eye redness, ear pain, ringing in the ears, wax buildup, runny nose, nasal congestion, bloody nose, or sore throat. Respiratory: Denies difficulty breathing, shortness of breath, cough or sputum production.   Cardiovascular: Denies chest pain, chest tightness, palpitations or swelling in the hands or feet.  Gastrointestinal: Denies abdominal pain, bloating, constipation, diarrhea or blood in the  stool.  GU: Denies urgency, frequency, pain with urination, burning sensation, blood in urine, odor or discharge. Musculoskeletal: Patient reports chronic right knee pain.  Denies decrease in range of motion, difficulty with gait, muscle pain or joint swelling.  Skin: Denies redness, rashes, lesions or ulcercations.  Neurological: Denies dizziness, difficulty with memory, difficulty with speech or problems with balance and coordination.  Psych: Denies anxiety, depression, SI/HI.  No other specific complaints in a complete review of systems (except as listed in HPI above).  Objective:   Physical Exam   LMP 04/09/2012  Wt Readings from Last 3 Encounters:  06/07/21 206 lb (93.4 kg)  01/12/21 215 lb (97.5 kg)  12/19/20 218 lb (98.9 kg)    General: Appears their stated age, well developed, well nourished in NAD. Skin: Warm, dry and intact. No rashes, lesions or ulcerations noted. HEENT: Head: normal shape and size; Eyes: sclera white, no icterus, conjunctiva pink, PERRLA and EOMs intact; Ears: Tm's gray and intact, normal light reflex; Nose: mucosa pink and moist, septum midline; Throat/Mouth: Teeth present, mucosa pink and moist, no exudate, lesions or ulcerations noted.  Neck:  Neck supple, trachea midline. No masses, lumps or thyromegaly present.  Cardiovascular: Normal rate and rhythm. S1,S2 noted.  No murmur, rubs or gallops noted. No JVD or BLE edema. No carotid bruits noted. Pulmonary/Chest: Normal effort and positive vesicular breath sounds. No respiratory distress. No wheezes, rales or ronchi noted.  Abdomen: Soft and nontender. Normal bowel sounds. No distention or masses noted. Liver, spleen and kidneys non palpable. Musculoskeletal: Normal range of motion. No signs of joint swelling. No difficulty with gait.  Neurological: Alert and oriented. Cranial nerves II-XII grossly intact. Coordination normal.  Psychiatric: Mood and affect normal. Behavior is normal. Judgment and thought  content normal.    BMET    Component Value Date/Time   NA 141 06/09/2020 1537   NA 139 02/10/2014 0516   K 3.9 06/09/2020 1537   K 3.8 02/10/2014 0516   CL 107 06/09/2020 1537   CL 103 02/10/2014 0516   CO2 25 06/09/2020 1537   CO2 30 02/10/2014 0516   GLUCOSE 77 06/09/2020 1537   GLUCOSE 76 02/10/2014 0516   BUN 10 06/09/2020 1537   BUN 5 (L) 02/10/2014 0516   CREATININE 1.05 06/09/2020 1537   CALCIUM 9.3 06/09/2020 1537   CALCIUM 7.9 (L) 02/10/2014 0516   GFRNONAA 69 11/18/2018 0000   GFRAA 80 11/18/2018 0000    Lipid Panel     Component Value Date/Time   CHOL 154 06/09/2020 1537   TRIG 73 06/09/2020 1537   HDL 50 06/09/2020 1537   CHOLHDL 3.1 06/09/2020  1537   VLDL 10.0 09/24/2017 1539   LDLCALC 88 06/09/2020 1537    CBC    Component Value Date/Time   WBC 7.8 06/09/2020 1537   RBC 4.62 06/09/2020 1537   HGB 12.6 06/09/2020 1537   HGB 8.4 (L) 02/10/2014 1750   HCT 38.2 06/09/2020 1537   HCT 25.0 (L) 02/10/2014 0516   PLT 275 06/09/2020 1537   PLT 125 (L) 02/10/2014 0516   MCV 82.7 06/09/2020 1537   MCV 87 02/10/2014 0516   MCH 27.3 06/09/2020 1537   MCHC 33.0 06/09/2020 1537   RDW 13.0 06/09/2020 1537   RDW 14.4 02/10/2014 0516   LYMPHSABS 2.2 10/21/2019 1019   LYMPHSABS 1.6 02/10/2014 0516   MONOABS 0.6 10/21/2019 1019   MONOABS 0.9 02/10/2014 0516   EOSABS 0.4 10/21/2019 1019   EOSABS 0.2 02/10/2014 0516   BASOSABS 0.0 10/21/2019 1019   BASOSABS 0.0 02/10/2014 0516    Hgb A1C Lab Results  Component Value Date   HGBA1C 5.1 06/09/2020           Assessment & Plan:     Webb Silversmith, NP

## 2021-09-25 DIAGNOSIS — L811 Chloasma: Secondary | ICD-10-CM | POA: Diagnosis not present

## 2021-09-25 DIAGNOSIS — L700 Acne vulgaris: Secondary | ICD-10-CM | POA: Diagnosis not present

## 2021-09-28 ENCOUNTER — Ambulatory Visit: Payer: BC Managed Care – PPO | Admitting: Internal Medicine

## 2021-09-28 NOTE — Progress Notes (Deleted)
Subjective:    Patient ID: Alexis Suarez, female    DOB: 09-08-1975, 46 y.o.   MRN: [id number]  HPI  Patient presents to clinic today to reestablish care with me and for management of the conditions listed below.  Chronic Right Knee Pain: She takes as needed with good relief of symptoms.  X-ray from 04/2020 reviewed.  She does not follow with orthopedics.  Review of Systems   Past Medical History:  Diagnosis Date   Asthma    ED eval in the past   Sciatica     Current Outpatient Medications  Medication Sig Dispense Refill   Phentermine-Topiramate (QSYMIA) 11.25-69 MG CP24 Take 1 tablet by mouth in the morning. With breakfast 30 capsule 0   No current facility-administered medications for this visit.    No Known Allergies  Family History  Problem Relation Age of Onset   Thyroid disease Mother    Hypertension Sister    2 / [place] Sister    Stroke Sister    Diabetes Father    Prostate cancer Paternal Grandfather    Cancer Paternal Grandfather        Prostate    Social History   Socioeconomic History   Marital status: Single    Spouse name: Not on file   Number of children: 1   Years of education: Not on file   Highest education level: Not on file  Occupational History   Not on file  Tobacco Use   Smoking status: Never   Smokeless tobacco: Never  Vaping Use   Vaping Use: Never used  Substance and Sexual Activity   Alcohol use: Yes    Alcohol/week: 0.0 standard drinks of alcohol    Comment: Rare   Drug use: No   Sexual activity: Yes    Partners: Male  Other Topics Concern   Not on file  Social History Narrative   Lives in [place] with mother, son and boyfriend in a one story home. 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      Education: college degree and 1 year of grad school.      Diet - regular diet            Social Determinants of Health   Financial Resource Strain: Low Risk  (06/17/2018)   Overall Financial Resource Strain (CARDIA)     Difficulty of Paying Living Expenses: Not hard at all  Food Insecurity: No Food Insecurity (06/17/2018)   Hunger Vital Sign    Worried About Running Out of Food in the Last Year: Never true    Ran Out of Food in the Last Year: Never true  Transportation Needs: No Transportation Needs (06/17/2018)   PRAPARE - Hydrologist (Medical): No    Lack of Transportation (Non-Medical): No  Physical Activity: Sufficiently Active (06/17/2018)   Exercise Vital Sign    Days of Exercise per Week: 5 days    Minutes of Exercise per Session: 50 min  Stress: No Stress Concern Present (06/17/2018)   [place]    Feeling of Stress : Not at all  Social Connections: Moderately Integrated (06/17/2018)   Social Connection and Isolation Panel [NHANES]    Frequency of Communication with Friends and Family: More than three times a week    Frequency of Social Gatherings with Friends and Family: More than three times a week    Attends Religious Services: More than 4 times per year  Active Member of Clubs or Organizations: Yes    Attends Archivist Meetings: More than 4 times per year    Marital Status: Never married  Intimate Partner Violence: Not At Risk (06/17/2018)   Humiliation, Afraid, Rape, and Kick questionnaire    Fear of Current or Ex-Partner: No    Emotionally Abused: No    Physically Abused: No    Sexually Abused: No     Constitutional: Denies fever, malaise, fatigue, headache or abrupt weight changes.  HEENT: Denies eye pain, eye redness, ear pain, ringing in the ears, wax buildup, runny nose, nasal congestion, bloody nose, or sore throat. Respiratory: Denies difficulty breathing, shortness of breath, cough or sputum production.   Cardiovascular: Denies chest pain, chest tightness, palpitations or swelling in the hands or feet.  Gastrointestinal: Denies abdominal pain, bloating, constipation,  diarrhea or blood in the stool.  GU: Denies urgency, frequency, pain with urination, burning sensation, blood in urine, odor or discharge. Musculoskeletal: Patient reports chronic right knee pain.  Denies decrease in range of motion, difficulty with gait, muscle pain or joint swelling.  Skin: Denies redness, rashes, lesions or ulcercations.  Neurological: Denies dizziness, difficulty with memory, difficulty with speech or problems with balance and coordination.  Psych: Denies anxiety, depression, SI/HI.  No other specific complaints in a complete review of systems (except as listed in HPI above).   Objective:   Physical Exam   LMP 04/09/2012  Wt Readings from Last 3 Encounters:  06/07/21 206 lb (93.4 kg)  01/12/21 215 lb (97.5 kg)  12/19/20 218 lb (98.9 kg)    General: Appears their stated age, well developed, well nourished in NAD. Skin: Warm, dry and intact. No rashes, lesions or ulcerations noted. HEENT: Head: normal shape and size; Eyes: sclera white, no icterus, conjunctiva pink, PERRLA and EOMs intact; Ears: Tm's gray and intact, normal light reflex; Nose: mucosa pink and moist, septum midline; Throat/Mouth: Teeth present, mucosa pink and moist, no exudate, lesions or ulcerations noted.  Neck:  Neck supple, trachea midline. No masses, lumps or thyromegaly present.  Cardiovascular: Normal rate and rhythm. S1,S2 noted.  No murmur, rubs or gallops noted. No JVD or BLE edema. No carotid bruits noted. Pulmonary/Chest: Normal effort and positive vesicular breath sounds. No respiratory distress. No wheezes, rales or ronchi noted.  Abdomen: Soft and nontender. Normal bowel sounds. No distention or masses noted. Liver, spleen and kidneys non palpable. Musculoskeletal: Normal range of motion. No signs of joint swelling. No difficulty with gait.  Neurological: Alert and oriented. Cranial nerves II-XII grossly intact. Coordination normal.  Psychiatric: Mood and affect normal. Behavior is  normal. Judgment and thought content normal.    BMET    Component Value Date/Time   NA 141 06/09/2020 1537   NA 139 02/10/2014 0516   K 3.9 06/09/2020 1537   K 3.8 02/10/2014 0516   CL 107 06/09/2020 1537   CL 103 02/10/2014 0516   CO2 25 06/09/2020 1537   CO2 30 02/10/2014 0516   GLUCOSE 77 06/09/2020 1537   GLUCOSE 76 02/10/2014 0516   BUN 10 06/09/2020 1537   BUN 5 (L) 02/10/2014 0516   CREATININE 1.05 06/09/2020 1537   CALCIUM 9.3 06/09/2020 1537   CALCIUM 7.9 (L) 02/10/2014 0516   GFRNONAA 69 11/18/2018 0000   GFRAA 80 11/18/2018 0000    Lipid Panel     Component Value Date/Time   CHOL 154 06/09/2020 1537   TRIG 73 06/09/2020 1537   HDL 50 06/09/2020 1537  CHOLHDL 3.1 06/09/2020 1537   VLDL 10.0 09/24/2017 1539   LDLCALC 88 06/09/2020 1537    CBC    Component Value Date/Time   WBC 7.8 06/09/2020 1537   RBC 4.62 06/09/2020 1537   HGB 12.6 06/09/2020 1537   HGB 8.4 (L) 02/10/2014 1750   HCT 38.2 06/09/2020 1537   HCT 25.0 (L) 02/10/2014 0516   PLT 275 06/09/2020 1537   PLT 125 (L) 02/10/2014 0516   MCV 82.7 06/09/2020 1537   MCV 87 02/10/2014 0516   MCH 27.3 06/09/2020 1537   MCHC 33.0 06/09/2020 1537   RDW 13.0 06/09/2020 1537   RDW 14.4 02/10/2014 0516   LYMPHSABS 2.2 10/21/2019 1019   LYMPHSABS 1.6 02/10/2014 0516   MONOABS 0.6 10/21/2019 1019   MONOABS 0.9 02/10/2014 0516   EOSABS 0.4 10/21/2019 1019   EOSABS 0.2 02/10/2014 0516   BASOSABS 0.0 10/21/2019 1019   BASOSABS 0.0 02/10/2014 0516    Hgb A1C Lab Results  Component Value Date   HGBA1C 5.1 06/09/2020           Assessment & Plan:    RTC in 6 months for annual exam Webb Silversmith, NP

## 2021-10-02 DIAGNOSIS — H00022 Hordeolum internum right lower eyelid: Secondary | ICD-10-CM | POA: Diagnosis not present

## 2021-10-12 ENCOUNTER — Other Ambulatory Visit: Payer: Self-pay | Admitting: Internal Medicine

## 2021-10-12 ENCOUNTER — Encounter: Payer: Self-pay | Admitting: Internal Medicine

## 2021-10-12 ENCOUNTER — Ambulatory Visit (INDEPENDENT_AMBULATORY_CARE_PROVIDER_SITE_OTHER): Payer: BC Managed Care – PPO | Admitting: Internal Medicine

## 2021-10-12 VITALS — BP 106/68 | HR 60 | Temp 97.5°F | Resp 16 | Ht 68.5 in | Wt 214.0 lb

## 2021-10-12 DIAGNOSIS — L708 Other acne: Secondary | ICD-10-CM

## 2021-10-12 DIAGNOSIS — Z1211 Encounter for screening for malignant neoplasm of colon: Secondary | ICD-10-CM

## 2021-10-12 DIAGNOSIS — Z6832 Body mass index (BMI) 32.0-32.9, adult: Secondary | ICD-10-CM

## 2021-10-12 DIAGNOSIS — M25561 Pain in right knee: Secondary | ICD-10-CM

## 2021-10-12 DIAGNOSIS — E6609 Other obesity due to excess calories: Secondary | ICD-10-CM | POA: Insufficient documentation

## 2021-10-12 DIAGNOSIS — L709 Acne, unspecified: Secondary | ICD-10-CM | POA: Insufficient documentation

## 2021-10-12 DIAGNOSIS — G8929 Other chronic pain: Secondary | ICD-10-CM

## 2021-10-12 MED ORDER — CLINDAMYCIN PHOS-BENZOYL PEROX 1-5 % EX GEL: Freq: Two times a day (BID) | CUTANEOUS | 0 refills | Status: DC

## 2021-10-12 MED ORDER — PHENTERMINE-TOPIRAMATE ER 15-92 MG PO CP24: 1.0000 | ORAL_CAPSULE | Freq: Every day | ORAL | 0 refills | Status: DC

## 2021-10-12 NOTE — Progress Notes (Signed)
Subjective:    Patient ID: Alexis Suarez, female    DOB: Mar 24, 1975, 46 y.o.   MRN: [id number]  HPI  Patient presents to clinic today for follow-up of chronic conditions.  Chronic Right Knee Pain: Currently not an issue.  X-ray from 04/2020 reviewed.  She does not follow with orthopedics.  Acne: She has seen dermatology for the same. She is taking Aldactone but she has not noticed that it helped. She would like to try something different.  She would also like to get started back on Qsymia.  Her weight today is 214 pounds with a BMI of 32.07.  Review of Systems     Past Medical History:  Diagnosis Date   Asthma    ED eval in the past   Sciatica     Current Outpatient Medications  Medication Sig Dispense Refill   Phentermine-Topiramate (QSYMIA) 11.25-69 MG CP24 Take 1 tablet by mouth in the morning. With breakfast 30 capsule 0   No current facility-administered medications for this visit.    No Known Allergies  Family History  Problem Relation Age of Onset   Thyroid disease Mother    Hypertension Sister    3 / [place] Sister    Stroke Sister    Diabetes Father    Prostate cancer Paternal Grandfather    Cancer Paternal Grandfather        Prostate    Social History   Socioeconomic History   Marital status: Single    Spouse name: Not on file   Number of children: 1   Years of education: Not on file   Highest education level: Not on file  Occupational History   Not on file  Tobacco Use   Smoking status: Never   Smokeless tobacco: Never  Vaping Use   Vaping Use: Never used  Substance and Sexual Activity   Alcohol use: Yes    Alcohol/week: 0.0 standard drinks of alcohol    Comment: Rare   Drug use: No   Sexual activity: Yes    Partners: Male  Other Topics Concern   Not on file  Social History Narrative   Lives in [place] with mother, son and boyfriend in a one story home. 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      Education: college degree  and 1 year of grad school.      Diet - regular diet            Social Determinants of Health   Financial Resource Strain: Low Risk  (06/17/2018)   Overall Financial Resource Strain (CARDIA)    Difficulty of Paying Living Expenses: Not hard at all  Food Insecurity: No Food Insecurity (06/17/2018)   Hunger Vital Sign    Worried About Running Out of Food in the Last Year: Never true    Ran Out of Food in the Last Year: Never true  Transportation Needs: No Transportation Needs (06/17/2018)   PRAPARE - Hydrologist (Medical): No    Lack of Transportation (Non-Medical): No  Physical Activity: Sufficiently Active (06/17/2018)   Exercise Vital Sign    Days of Exercise per Week: 5 days    Minutes of Exercise per Session: 50 min  Stress: No Stress Concern Present (06/17/2018)   [place]    Feeling of Stress : Not at all  Social Connections: Moderately Integrated (06/17/2018)   Social Connection and Isolation Panel [NHANES]    Frequency of Communication  with Friends and Family: More than three times a week    Frequency of Social Gatherings with Friends and Family: More than three times a week    Attends Religious Services: More than 4 times per year    Active Member of [employer] or Organizations: Yes    Attends Archivist Meetings: More than 4 times per year    Marital Status: Never married  Intimate Partner Violence: Not At Risk (06/17/2018)   Humiliation, Afraid, Rape, and Kick questionnaire    Fear of Current or Ex-Partner: No    Emotionally Abused: No    Physically Abused: No    Sexually Abused: No     Constitutional: Patient reports abnormal weight gain.  Denies fever, malaise, fatigue, headache.  HEENT: Denies eye pain, eye redness, ear pain, ringing in the ears, wax buildup, runny nose, nasal congestion, bloody nose, or sore throat. Respiratory: Denies difficulty breathing,  shortness of breath, cough or sputum production.   Cardiovascular: Denies chest pain, chest tightness, palpitations or swelling in the hands or feet.  Gastrointestinal: Denies abdominal pain, bloating, constipation, diarrhea or blood in the stool.  GU: Denies urgency, frequency, pain with urination, burning sensation, blood in urine, odor or discharge. Musculoskeletal: Patient reports chronic right knee pain.  Denies decrease in range of motion, difficulty with gait, muscle pain or joint swelling.  Skin: Pt reports acne. Denies redness, rashes, or ulcercations.  Neurological: Denies dizziness, difficulty with memory, difficulty with speech or problems with balance and coordination.  Psych: Denies anxiety, depression, SI/HI.  No other specific complaints in a complete review of systems (except as listed in HPI above).  Objective:   Physical Exam  BP 106/68 (BP Location: Left Arm, Patient Position: Sitting, Cuff Size: Normal)   Pulse 60   Temp (!) 97.5 F (36.4 C) (Temporal)   Resp 16   Ht 5' 8.5" (1.74 m)   Wt 214 lb (97.1 kg)   LMP 04/09/2012   BMI 32.07 kg/m   Wt Readings from Last 3 Encounters:  06/07/21 206 lb (93.4 kg)  01/12/21 215 lb (97.5 kg)  12/19/20 218 lb (98.9 kg)    General: Appears her stated age, obese, in NAD. Skin: Warm, dry and intact.  Acne noted on face. Cardiovascular: Normal rate and rhythm. S1,S2 noted.  No murmur, rubs or gallops noted.  Pulmonary/Chest: Normal effort and positive vesicular breath sounds. No respiratory distress. No wheezes, rales or ronchi noted.  Musculoskeletal: No difficulty with gait.  Neurological: Alert and oriented.  Psychiatric: Mood and affect normal. Behavior is normal. Judgment and thought content normal.    BMET    Component Value Date/Time   NA 141 06/09/2020 1537   NA 139 02/10/2014 0516   K 3.9 06/09/2020 1537   K 3.8 02/10/2014 0516   CL 107 06/09/2020 1537   CL 103 02/10/2014 0516   CO2 25 06/09/2020 1537    CO2 30 02/10/2014 0516   GLUCOSE 77 06/09/2020 1537   GLUCOSE 76 02/10/2014 0516   BUN 10 06/09/2020 1537   BUN 5 (L) 02/10/2014 0516   CREATININE 1.05 06/09/2020 1537   CALCIUM 9.3 06/09/2020 1537   CALCIUM 7.9 (L) 02/10/2014 0516   GFRNONAA 69 11/18/2018 0000   GFRAA 80 11/18/2018 0000    Lipid Panel     Component Value Date/Time   CHOL 154 06/09/2020 1537   TRIG 73 06/09/2020 1537   HDL 50 06/09/2020 1537   CHOLHDL 3.1 06/09/2020 1537   VLDL 10.0 09/24/2017  1539   LDLCALC 88 06/09/2020 1537    CBC    Component Value Date/Time   WBC 7.8 06/09/2020 1537   RBC 4.62 06/09/2020 1537   HGB 12.6 06/09/2020 1537   HGB 8.4 (L) 02/10/2014 1750   HCT 38.2 06/09/2020 1537   HCT 25.0 (L) 02/10/2014 0516   PLT 275 06/09/2020 1537   PLT 125 (L) 02/10/2014 0516   MCV 82.7 06/09/2020 1537   MCV 87 02/10/2014 0516   MCH 27.3 06/09/2020 1537   MCHC 33.0 06/09/2020 1537   RDW 13.0 06/09/2020 1537   RDW 14.4 02/10/2014 0516   LYMPHSABS 2.2 10/21/2019 1019   LYMPHSABS 1.6 02/10/2014 0516   MONOABS 0.6 10/21/2019 1019   MONOABS 0.9 02/10/2014 0516   EOSABS 0.4 10/21/2019 1019   EOSABS 0.2 02/10/2014 0516   BASOSABS 0.0 10/21/2019 1019   BASOSABS 0.0 02/10/2014 0516    Hgb A1C Lab Results  Component Value Date   HGBA1C 5.1 06/09/2020           Assessment & Plan:     RTC in 6 months for your annual exam Webb Silversmith, NP

## 2021-10-12 NOTE — Patient Instructions (Signed)
Acne  Acne is a skin problem that causes pimples and other skin changes. The skin has many tiny openings called pores. Each pore contains an oil gland. Oil glands make an oily substance that is called sebum. Acne occurs when the pores in the skin get blocked. The pores may become infected with bacteria, or they may become red, sore, and swollen. Acne is a common skin problem, especially for teenagers. It often occurs on the face, neck, chest, upper arms, and back. Acne usually goes away over time. What are the causes? Acne is caused when oil glands get blocked with sebum, dead skin cells, and dirt. The bacteria that are normally found in the oil glands then multiply and cause inflammation. Acne is commonly triggered by changes in your hormones. These hormonal changes can cause the oil glands to get bigger and to make more sebum. Factors that can make acne worse include: Hormone changes during: Adolescence. Women's menstrual cycles. Pregnancy. Oil-based cosmetics and hair products. Stress. Hormone problems that are caused by certain diseases. Certain medicines. Pressure from headbands, backpacks, or shoulder pads. Exposure to certain oils and chemicals. Eating a diet high in carbohydrates that quickly turn to sugar. These include dairy products, desserts, and chocolates. What increases the risk? This condition is more likely to develop in: Teenagers. People who have a family history of acne. What are the signs or symptoms? Symptoms include: Small, red bumps (pimples or papules). Whiteheads. Blackheads. Small, pus-filled pimples (pustules). Big, red pimples or pustules that feel tender. More severe acne can cause: An abscess. This is an infected area that contains a collection of pus. Cysts. These are hard, painful, fluid-filled sacs. Scars. These can happen after large pimples heal. How is this diagnosed? This condition is diagnosed with a medical history and physical exam. Blood  tests may also be done. How is this treated? Treatment for this condition can vary depending on the severity of your acne. Treatment may include: Creams and lotions that prevent oil glands from clogging. Creams and lotions that treat or prevent infections and inflammation. Antibiotic medicines that are applied to the skin or taken as a pill. Pills that decrease sebum production. Birth control pills. Light or laser treatments. Injections of medicine into the affected areas. Chemicals that cause peeling of the skin. Surgery. Your health care provider will also recommend the best way to take care of your skin. Good skin care is the most important part of treatment. Follow these instructions at home: Skin care Take care of your skin as told by your health care provider. You may be told to do these things: Wash your skin gently at least two times each day, as well as: After you exercise. Before you go to bed. Use mild soap. Apply a water-based skin moisturizer after you wash your skin. Use a sunscreen or sunblock with SPF 30 or greater. This is especially important if you are using acne medicines. Choose cosmetics that will not block your oil glands (are noncomedogenic). Medicines Take over-the-counter and prescription medicines only as told by your health care provider. If you were prescribed an antibiotic medicine, apply it or take it as told by your health care provider. Do not stop using the antibiotic even if your condition improves. General instructions Keep your hair clean and off your face. If you have oily hair, shampoo your hair regularly or daily. Avoid wearing tight headbands or hats. Avoid picking or squeezing your pimples. That can make your acne worse and cause scarring. Shave gently  and only when necessary. Keep a food journal to figure out if any foods are linked to your acne. Avoid dairy products, desserts, and chocolates. Take steps to manage and reduce stress. Keep all  follow-up visits as told by your health care provider. This is important. Contact a health care provider if: Your acne is not better after eight weeks. Your acne gets worse. You have a large area of skin that is red or tender. You think that you are having side effects from any acne medicine. Summary Acne is a skin problem that causes pimples and other skin changes. Acne is a common skin problem, especially for teenagers. Acne usually goes away over time. Acne is commonly triggered by changes in your hormones. There are many other causes, such as stress, diet, and certain medicines. Follow your health care provider's instructions for how to take care of your skin. Good skin care is the most important part of treatment. Take over-the-counter and prescription medicines only as told by your health care provider. Contact your health care provider if you think that you are having side effects from any acne medicine. This information is not intended to replace advice given to you by your health care provider. Make sure you discuss any questions you have with your health care provider. Document Revised: 11/29/2020 Document Reviewed: 11/21/2020 Elsevier Patient Education  [place].

## 2021-10-12 NOTE — Telephone Encounter (Signed)
   Notes to clinic:  Just filled today for 25 g, pt is asking for 3 month supply, 75 g, please assess.      Requested Prescriptions  Pending Prescriptions Disp Refills   clindamycin-benzoyl peroxide (BENZACLIN) gel [Pharmacy Med Name: CLINDAMYCIN/BENZOY 1/5% GEL 25G JAR] 75 g     Sig: APPLY TOPICALLY TO THE AFFECTED AREA TWICE DAILY     Dermatology:  Acne preparations Passed - 10/12/2021  4:10 PM      Passed - Valid encounter within last 12 months    Recent Outpatient Visits           Today Screening for colon cancer   [facility] [place], Coralie Keens, NP   2 years ago Acute pain of both knees   [facility] Delsa Grana, [place]   2 years ago Dark urine   [facility] Hubbard Hartshorn, [place]   2 years ago Pre-op evaluation   [place], [place]   3 years ago Obesity due to excess calories, unspecified classification, unspecified whether serious comorbidity present   [facility] Hubbard Hartshorn, FNP       Future Appointments             In 6 months Baity, Coralie Keens, NP [facility], [facility]

## 2021-10-12 NOTE — Assessment & Plan Note (Signed)
Encourage weight loss as this can help reduce joint pain 

## 2021-10-12 NOTE — Assessment & Plan Note (Signed)
We will trial BenzaClin in addition to Aldactone

## 2021-10-12 NOTE — Assessment & Plan Note (Signed)
We will restart Qsymia

## 2021-10-17 ENCOUNTER — Encounter: Payer: Self-pay | Admitting: Internal Medicine

## 2021-11-27 DIAGNOSIS — Z03818 Encounter for observation for suspected exposure to other biological agents ruled out: Secondary | ICD-10-CM | POA: Diagnosis not present

## 2021-11-27 DIAGNOSIS — Z20822 Contact with and (suspected) exposure to covid-19: Secondary | ICD-10-CM | POA: Diagnosis not present

## 2022-01-03 ENCOUNTER — Encounter: Payer: Self-pay | Admitting: Internal Medicine

## 2022-01-03 ENCOUNTER — Ambulatory Visit: Payer: BC Managed Care – PPO | Admitting: Internal Medicine

## 2022-01-03 VITALS — BP 108/76 | HR 72 | Temp 98.1°F | Ht 68.5 in | Wt 206.0 lb

## 2022-01-03 DIAGNOSIS — Z23 Encounter for immunization: Secondary | ICD-10-CM

## 2022-01-03 DIAGNOSIS — R7309 Other abnormal glucose: Secondary | ICD-10-CM

## 2022-01-03 DIAGNOSIS — Z1211 Encounter for screening for malignant neoplasm of colon: Secondary | ICD-10-CM

## 2022-01-03 DIAGNOSIS — Z0000 Encounter for general adult medical examination without abnormal findings: Secondary | ICD-10-CM | POA: Diagnosis not present

## 2022-01-03 DIAGNOSIS — Z0001 Encounter for general adult medical examination with abnormal findings: Secondary | ICD-10-CM

## 2022-01-03 DIAGNOSIS — L709 Acne, unspecified: Secondary | ICD-10-CM

## 2022-01-03 DIAGNOSIS — Z1231 Encounter for screening mammogram for malignant neoplasm of breast: Secondary | ICD-10-CM

## 2022-01-03 DIAGNOSIS — Z6830 Body mass index (BMI) 30.0-30.9, adult: Secondary | ICD-10-CM

## 2022-01-03 DIAGNOSIS — E6609 Other obesity due to excess calories: Secondary | ICD-10-CM

## 2022-01-03 MED ORDER — PHENTERMINE-TOPIRAMATE ER 15-92 MG PO CP24: 1.0000 | ORAL_CAPSULE | Freq: Every day | ORAL | 0 refills | Status: DC

## 2022-01-03 NOTE — Progress Notes (Signed)
Subjective:    Patient ID: Alexis Suarez, female    DOB: Dec 12, 1975, 46 y.o.   MRN: [id number]  HPI  Patient presents to clinic today for her annual exam.  She is concerned about acne on her face.  She is currently on spironolactone and BenzaClin but has not noticed that it helped.  She reports she routinely goes and gets facials and chemical peels at the med spa.  She would like to check her hormones today to see if her acne is hormonal.  Flu: 12/2020 Tetanus: 04/2010 COVID: [place] x2 Pap smear: Hysterectomy Mammogram: 07/2019 Colon screening: Never Vision screening: annually Dentist: biannually  Diet: She does eat meat. She consumes fruits and veggies. She does eat some fried foods. She drinks mostly water or juice. Exercise: None  Review of Systems     Past Medical History:  Diagnosis Date   Asthma    ED eval in the past   Sciatica     Current Outpatient Medications  Medication Sig Dispense Refill   clindamycin-benzoyl peroxide (BENZACLIN) gel Apply topically 2 (two) times daily. 25 g 0   Phentermine-Topiramate 15-92 MG CP24 Take 1 tablet by mouth daily. 30 capsule 0   spironolactone (ALDACTONE) 100 MG tablet Take 100 mg by mouth daily.     No current facility-administered medications for this visit.    No Known Allergies  Family History  Problem Relation Age of Onset   Thyroid disease Mother    Hypertension Sister    8 / [place] Sister    Stroke Sister    Diabetes Father    Prostate cancer Paternal Grandfather    Cancer Paternal Grandfather        Prostate    Social History   Socioeconomic History   Marital status: Single    Spouse name: Not on file   Number of children: 1   Years of education: Not on file   Highest education level: Not on file  Occupational History   Not on file  Tobacco Use   Smoking status: Never   Smokeless tobacco: Never  Vaping Use   Vaping Use: Never used  Substance and Sexual Activity   Alcohol use: Yes     Alcohol/week: 0.0 standard drinks of alcohol    Comment: Rare   Drug use: No   Sexual activity: Yes    Partners: Male  Other Topics Concern   Not on file  Social History Narrative   Lives in [place] with mother, son and boyfriend in a one story home. 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      Education: college degree and 1 year of grad school.      Diet - regular diet            Social Determinants of Health   Financial Resource Strain: Low Risk  (06/17/2018)   Overall Financial Resource Strain (CARDIA)    Difficulty of Paying Living Expenses: Not hard at all  Food Insecurity: No Food Insecurity (06/17/2018)   Hunger Vital Sign    Worried About Running Out of Food in the Last Year: Never true    Ran Out of Food in the Last Year: Never true  Transportation Needs: No Transportation Needs (06/17/2018)   PRAPARE - Hydrologist (Medical): No    Lack of Transportation (Non-Medical): No  Physical Activity: Sufficiently Active (06/17/2018)   Exercise Vital Sign    Days of Exercise per Week: 5 days    Minutes  of Exercise per Session: 50 min  Stress: No Stress Concern Present (06/17/2018)   [place]    Feeling of Stress : Not at all  Social Connections: Moderately Integrated (06/17/2018)   Social Connection and Isolation Panel [NHANES]    Frequency of Communication with Friends and Family: More than three times a week    Frequency of Social Gatherings with Friends and Family: More than three times a week    Attends Religious Services: More than 4 times per year    Active Member of [employer] or Organizations: Yes    Attends Archivist Meetings: More than 4 times per year    Marital Status: Never married  Intimate Partner Violence: Not At Risk (06/17/2018)   Humiliation, Afraid, Rape, and Kick questionnaire    Fear of Current or Ex-Partner: No    Emotionally Abused: No    Physically  Abused: No    Sexually Abused: No     Constitutional: Denies fever, malaise, fatigue, headache or abrupt weight changes.  HEENT: Denies eye pain, eye redness, ear pain, ringing in the ears, wax buildup, runny nose, nasal congestion, bloody nose, or sore throat. Respiratory: Denies difficulty breathing, shortness of breath, cough or sputum production.   Cardiovascular: Denies chest pain, chest tightness, palpitations or swelling in the hands or feet.  Gastrointestinal: Denies abdominal pain, bloating, constipation, diarrhea or blood in the stool.  GU: Denies urgency, frequency, pain with urination, burning sensation, blood in urine, odor or discharge. Musculoskeletal: Patient reports chronic right knee pain.  Denies decrease in range of motion, difficulty with gait, muscle pain or joint swelling.  Skin: Patient reports acne.  Denies redness, rashes, or ulcercations.  Neurological: Denies dizziness, difficulty with memory, difficulty with speech or problems with balance and coordination.  Psych: Denies anxiety, depression, SI/HI.  No other specific complaints in a complete review of systems (except as listed in HPI above).  Objective:   Physical Exam  BP 108/76 (BP Location: Right Arm, Patient Position: Sitting, Cuff Size: Normal)   Pulse 72   Temp 98.1 F (36.7 C) (Oral)   Ht 5' 8.5" (1.74 m)   Wt 206 lb (93.4 kg)   LMP 04/09/2012   BMI 30.87 kg/m   Wt Readings from Last 3 Encounters:  10/12/21 214 lb (97.1 kg)  06/07/21 206 lb (93.4 kg)  01/12/21 215 lb (97.5 kg)    General: Appears her stated age, obese, in NAD. Skin: Warm, dry and intact.  Acne noted on face. HEENT: Head: normal shape and size; Eyes: sclera white, no icterus, conjunctiva pink, PERRLA and EOMs intact;  Neck:  Neck supple, trachea midline. No masses, lumps or thyromegaly present.  Cardiovascular: Normal rate and rhythm. S1,S2 noted.  No murmur, rubs or gallops noted. No JVD or BLE edema.  Pulmonary/Chest:  Normal effort and positive vesicular breath sounds. No respiratory distress. No wheezes, rales or ronchi noted.  Abdomen: Normal bowel sounds.  Musculoskeletal: Strength 5/5 BUE/BLE.  No difficulty with gait.  Neurological: Alert and oriented. Cranial nerves II-XII grossly intact. Coordination normal.  Psychiatric: Mood and affect normal. Behavior is normal. Judgment and thought content normal.   BMET    Component Value Date/Time   NA 141 06/09/2020 1537   NA 139 02/10/2014 0516   K 3.9 06/09/2020 1537   K 3.8 02/10/2014 0516   CL 107 06/09/2020 1537   CL 103 02/10/2014 0516   CO2 25 06/09/2020 1537   CO2 30  02/10/2014 0516   GLUCOSE 77 06/09/2020 1537   GLUCOSE 76 02/10/2014 0516   BUN 10 06/09/2020 1537   BUN 5 (L) 02/10/2014 0516   CREATININE 1.05 06/09/2020 1537   CALCIUM 9.3 06/09/2020 1537   CALCIUM 7.9 (L) 02/10/2014 0516   GFRNONAA 69 11/18/2018 0000   GFRAA 80 11/18/2018 0000    Lipid Panel     Component Value Date/Time   CHOL 154 06/09/2020 1537   TRIG 73 06/09/2020 1537   HDL 50 06/09/2020 1537   CHOLHDL 3.1 06/09/2020 1537   VLDL 10.0 09/24/2017 1539   LDLCALC 88 06/09/2020 1537    CBC    Component Value Date/Time   WBC 7.8 06/09/2020 1537   RBC 4.62 06/09/2020 1537   HGB 12.6 06/09/2020 1537   HGB 8.4 (L) 02/10/2014 1750   HCT 38.2 06/09/2020 1537   HCT 25.0 (L) 02/10/2014 0516   PLT 275 06/09/2020 1537   PLT 125 (L) 02/10/2014 0516   MCV 82.7 06/09/2020 1537   MCV 87 02/10/2014 0516   MCH 27.3 06/09/2020 1537   MCHC 33.0 06/09/2020 1537   RDW 13.0 06/09/2020 1537   RDW 14.4 02/10/2014 0516   LYMPHSABS 2.2 10/21/2019 1019   LYMPHSABS 1.6 02/10/2014 0516   MONOABS 0.6 10/21/2019 1019   MONOABS 0.9 02/10/2014 0516   EOSABS 0.4 10/21/2019 1019   EOSABS 0.2 02/10/2014 0516   BASOSABS 0.0 10/21/2019 1019   BASOSABS 0.0 02/10/2014 0516    Hgb A1C Lab Results  Component Value Date   HGBA1C 5.1 06/09/2020           Assessment & Plan:    Preventative Health Maintenance:  Flu shot today Tdap today Encouraged her to get her COVID booster She no longer needs Pap smears Mammogram ordered-she will call to schedule Referral to GI for screening colonoscopy Encouraged her to consume a balanced diet and exercise regimen Advised her to see an eye doctor and dentist annually We will check CBC, c-Met, lipid, A1c today  RTC in 6 months, follow-up chronic conditions Webb Silversmith, NP

## 2022-01-03 NOTE — Assessment & Plan Note (Signed)
We will check estrogen, progesterone and testosterone Continue spironolactone and BenzaClin Although she is following with a med spa, we should consider referral to dermatology

## 2022-01-03 NOTE — Patient Instructions (Signed)

## 2022-01-03 NOTE — Assessment & Plan Note (Signed)
Encourage diet and exercise for weight loss Qsymia refilled today

## 2022-01-08 ENCOUNTER — Encounter: Payer: Self-pay | Admitting: Internal Medicine

## 2022-01-08 LAB — LIPID PANEL
Cholesterol: 167 mg/dL (ref <200)
HDL: 52 mg/dL (ref >=50)
LDL Cholesterol (Calc): 97 mg/dL (calc)
Non-HDL Cholesterol (Calc): 115 mg/dL (calc) (ref <130)
Total CHOL/HDL Ratio: 3.2 (calc) (ref <5.0)
Triglycerides: 86 mg/dL (ref <150)

## 2022-01-08 LAB — ESTROGENS, TOTAL: Estrogen: 163 pg/mL

## 2022-01-08 LAB — CBC
HCT: 38 % (ref 35.0–45.0)
Hemoglobin: 12.9 g/dL (ref 11.7–15.5)
MCH: 28.1 pg (ref 27.0–33.0)
MCHC: 33.9 g/dL (ref 32.0–36.0)
MCV: 82.8 fL (ref 80.0–100.0)
MPV: 11.1 fL (ref 7.5–12.5)
Platelets: 260 10*3/uL (ref 140–400)
RBC: 4.59 10*6/uL (ref 3.80–5.10)
RDW: 12.1 % (ref 11.0–15.0)
WBC: 6.1 10*3/uL (ref 3.8–10.8)

## 2022-01-08 LAB — COMPLETE METABOLIC PANEL WITH GFR
AG Ratio: 1.4 (calc) (ref 1.0–2.5)
ALT: 10 U/L (ref 6–29)
AST: 13 U/L (ref 10–35)
Albumin: 4.2 g/dL (ref 3.6–5.1)
Alkaline phosphatase (APISO): 74 U/L (ref 31–125)
BUN: 10 mg/dL (ref 7–25)
CO2: 26 mmol/L (ref 20–32)
Calcium: 9.2 mg/dL (ref 8.6–10.2)
Chloride: 106 mmol/L (ref 98–110)
Creat: 0.95 mg/dL (ref 0.50–0.99)
Globulin: 3 g/dL (calc) (ref 1.9–3.7)
Glucose, Bld: 82 mg/dL (ref 65–99)
Potassium: 4.2 mmol/L (ref 3.5–5.3)
Sodium: 139 mmol/L (ref 135–146)
Total Bilirubin: 0.6 mg/dL (ref 0.2–1.2)
Total Protein: 7.2 g/dL (ref 6.1–8.1)
eGFR: 75 mL/min/{1.73_m2} (ref >=60)

## 2022-01-08 LAB — HEMOGLOBIN A1C
Hgb A1c MFr Bld: 5.4 % of total Hgb (ref <5.7)
Mean Plasma Glucose: 108 mg/dL
eAG (mmol/L): 6 mmol/L

## 2022-01-08 LAB — TESTOSTERONE, TOTAL, LC/MS/MS: Testosterone, Total, LC-MS-MS: 25 ng/dL (ref 2–45)

## 2022-01-08 LAB — PROGESTERONE: Progesterone: 2.1 ng/mL

## 2022-01-09 ENCOUNTER — Other Ambulatory Visit: Payer: Self-pay | Admitting: Internal Medicine

## 2022-01-09 MED ORDER — NORETHINDRONE 0.35 MG PO TABS: 1.0000 | ORAL_TABLET | Freq: Every day | ORAL | 1 refills | Status: DC

## 2022-03-13 DIAGNOSIS — N62 Hypertrophy of breast: Secondary | ICD-10-CM | POA: Diagnosis not present

## 2022-03-29 ENCOUNTER — Ambulatory Visit
Admission: RE | Admit: 2022-03-29 | Discharge: 2022-03-29 | Disposition: A | Discharge status: Home / Self Care | Payer: BC Managed Care – PPO | Source: Ambulatory Visit | Attending: Internal Medicine | Admitting: Internal Medicine

## 2022-03-29 DIAGNOSIS — Z1231 Encounter for screening mammogram for malignant neoplasm of breast: Secondary | ICD-10-CM

## 2022-04-12 ENCOUNTER — Encounter: Payer: Self-pay | Admitting: Internal Medicine

## 2022-04-12 ENCOUNTER — Ambulatory Visit: Payer: BC Managed Care – PPO | Admitting: Internal Medicine

## 2022-04-12 VITALS — BP 112/60 | HR 100 | Temp 96.8°F | Wt 207.0 lb

## 2022-04-12 DIAGNOSIS — F4329 Adjustment disorder with other symptoms: Secondary | ICD-10-CM

## 2022-04-12 NOTE — Progress Notes (Signed)
Subjective:    Patient ID: Alexis Suarez, female    DOB: 12-31-1975, 47 y.o.   MRN: [id number]  HPI  Patient presents to clinic today with complaint of increased stress at work and at home.  She reports her father had part of his liver removed back in November.  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  She is feeling very overwhelmed.  She denies feeling anxious or depressed.  She is not currently taking any medications for this.  She is not currently seeing a therapist.  She is requesting a work note to take a couple days off work.  Review of Systems     Past Medical History:  Diagnosis Date   Asthma    ED eval in the past   Sciatica     Current Outpatient Medications  Medication Sig Dispense Refill   norethindrone (MICRONOR) 0.35 MG tablet Take 1 tablet (0.35 mg total) by mouth daily. 84 tablet 1   clindamycin-benzoyl peroxide (BENZACLIN) gel Apply topically 2 (two) times daily. 25 g 0   Phentermine-Topiramate 15-92 MG CP24 Take 1 tablet by mouth daily. 30 capsule 0   spironolactone (ALDACTONE) 100 MG tablet Take 100 mg by mouth daily.     No current facility-administered medications for this visit.    No Known Allergies  Family History  Problem Relation Age of Onset   Thyroid disease Mother    Hypertension Sister    9 / [place] Sister    Stroke Sister    Diabetes Father    Prostate cancer Paternal Grandfather    Cancer Paternal Grandfather        Prostate    Social History   Socioeconomic History   Marital status: Single    Spouse name: Not on file   Number of children: 1   Years of education: Not on file   Highest education level: Not on file  Occupational History   Not on file  Tobacco Use   Smoking status: Never   Smokeless tobacco: Never  Vaping Use   Vaping Use: Never used  Substance and Sexual Activity   Alcohol use:  Yes    Alcohol/week: 0.0 standard drinks of alcohol    Comment: Rare   Drug use: No   Sexual activity: Yes    Partners: Male  Other Topics Concern   Not on file  Social History Narrative   Lives in [place] with mother, son and boyfriend in a one story home. 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      Education: college degree and 1 year of grad school.      Diet - regular diet            Social Determinants of Health   Financial Resource Strain: Low Risk  (06/17/2018)   Overall Financial Resource Strain (CARDIA)    Difficulty of Paying Living Expenses: Not hard at all  Food Insecurity: No Food Insecurity (06/17/2018)   Hunger Vital Sign    Worried About Running Out of Food in the Last Year: Never true    Ran Out of Food in the Last Year: Never true  Transportation Needs: No Transportation Needs (06/17/2018)   PRAPARE - Hydrologist (Medical): No    Lack of Transportation (Non-Medical): No  Physical Activity: Sufficiently Active (06/17/2018)   Exercise Vital Sign  Days of Exercise per Week: 5 days    Minutes of Exercise per Session: 50 min  Stress: No Stress Concern Present (06/17/2018)   [place]    Feeling of Stress : Not at all  Social Connections: Moderately Integrated (06/17/2018)   Social Connection and Isolation Panel [NHANES]    Frequency of Communication with Friends and Family: More than three times a week    Frequency of Social Gatherings with Friends and Family: More than three times a week    Attends Religious Services: More than 4 times per year    Active Member of [employer] or Organizations: Yes    Attends Archivist Meetings: More than 4 times per year    Marital Status: Never married  Intimate Partner Violence: Not At Risk (06/17/2018)   Humiliation, Afraid, Rape, and Kick questionnaire    Fear of Current or Ex-Partner: No    Emotionally Abused: No    Physically  Abused: No    Sexually Abused: No     Constitutional: Denies fever, malaise, fatigue, headache or abrupt weight changes.  Respiratory: Denies difficulty breathing, shortness of breath, cough or sputum production.   Cardiovascular: Denies chest pain, chest tightness, palpitations or swelling in the hands or feet.  Neurological: Denies dizziness, difficulty with memory, difficulty with speech or problems with balance and coordination.  Psych: Patient reports stress.  Denies anxiety, depression, SI/HI.  No other specific complaints in a complete review of systems (except as listed in HPI above).  Objective:   Physical Exam BP 112/60 (BP Location: Left Arm, Patient Position: Sitting, Cuff Size: Normal)   Pulse 100   Temp (!) 96.8 F (36 C) (Temporal)   Wt 207 lb (93.9 kg)   LMP 04/09/2012   BMI 31.02 kg/m   Wt Readings from Last 3 Encounters:  01/03/22 206 lb (93.4 kg)  10/12/21 214 lb (97.1 kg)  06/07/21 206 lb (93.4 kg)    General: Appears her stated age,obese, in NAD. Cardiovascular: Normal rate and rhythm.  Pulmonary/Chest: Normal effort and positive vesicular breath sounds.  Musculoskeletal: No difficulty with gait.  Neurological: Alert and oriented.  Coordination normal.  Psychiatric: Mood and affect normal.  Mildly anxious appearing. Judgment and thought content normal.    BMET    Component Value Date/Time   NA 139 01/03/2022 0908   NA 139 02/10/2014 0516   K 4.2 01/03/2022 0908   K 3.8 02/10/2014 0516   CL 106 01/03/2022 0908   CL 103 02/10/2014 0516   CO2 26 01/03/2022 0908   CO2 30 02/10/2014 0516   GLUCOSE 82 01/03/2022 0908   GLUCOSE 76 02/10/2014 0516   BUN 10 01/03/2022 0908   BUN 5 (L) 02/10/2014 0516   CREATININE 0.95 01/03/2022 0908   CALCIUM 9.2 01/03/2022 0908   CALCIUM 7.9 (L) 02/10/2014 0516   GFRNONAA 69 11/18/2018 0000   GFRAA 80 11/18/2018 0000    Lipid Panel     Component Value Date/Time   CHOL 167 01/03/2022 0908   TRIG 86  01/03/2022 0908   HDL 52 01/03/2022 0908   CHOLHDL 3.2 01/03/2022 0908   VLDL 10.0 09/24/2017 1539   LDLCALC 97 01/03/2022 0908    CBC    Component Value Date/Time   WBC 6.1 01/03/2022 0908   RBC 4.59 01/03/2022 0908   HGB 12.9 01/03/2022 0908   HGB 8.4 (L) 02/10/2014 1750   HCT 38.0 01/03/2022 0908   HCT 25.0 (L) 02/10/2014  0516   PLT 260 01/03/2022 0908   PLT 125 (L) 02/10/2014 0516   MCV 82.8 01/03/2022 0908   MCV 87 02/10/2014 0516   MCH 28.1 01/03/2022 0908   MCHC 33.9 01/03/2022 0908   RDW 12.1 01/03/2022 0908   RDW 14.4 02/10/2014 0516   LYMPHSABS 2.2 10/21/2019 1019   LYMPHSABS 1.6 02/10/2014 0516   MONOABS 0.6 10/21/2019 1019   MONOABS 0.9 02/10/2014 0516   EOSABS 0.4 10/21/2019 1019   EOSABS 0.2 02/10/2014 0516   BASOSABS 0.0 10/21/2019 1019   BASOSABS 0.0 02/10/2014 0516    Hgb A1C Lab Results  Component Value Date   HGBA1C 5.4 01/03/2022            Assessment & Plan:   Stress and Adjustment Reaction:  She is not interested in medication management at this time She is not interested in referral for therapeutic services Work note provided  RTC in 3 months for follow-up of chronic conditions Webb Silversmith, NP

## 2022-04-12 NOTE — Patient Instructions (Signed)

## 2022-04-19 ENCOUNTER — Encounter: Payer: Self-pay | Admitting: Internal Medicine

## 2022-04-19 ENCOUNTER — Encounter: Payer: BC Managed Care – PPO | Admitting: Internal Medicine

## 2022-05-22 ENCOUNTER — Encounter: Payer: Self-pay | Admitting: Internal Medicine

## 2022-05-22 ENCOUNTER — Other Ambulatory Visit: Payer: Self-pay | Admitting: Internal Medicine

## 2022-05-23 MED ORDER — PHENTERMINE-TOPIRAMATE ER 15-92 MG PO CP24: 1.0000 | ORAL_CAPSULE | Freq: Every day | ORAL | 0 refills | Status: DC

## 2022-07-09 ENCOUNTER — Other Ambulatory Visit: Payer: Self-pay | Admitting: Internal Medicine

## 2022-07-10 ENCOUNTER — Encounter: Payer: Self-pay | Admitting: Internal Medicine

## 2022-07-10 NOTE — Telephone Encounter (Signed)
Requested Prescriptions  Pending Prescriptions Disp Refills   norethindrone (MICRONOR) 0.35 MG tablet [Pharmacy Med Name: NORETHINDRONE 0.35MG  TABLETS 28S] 84 tablet 1    Sig: TAKE 1 TABLET(0.35 MG) BY MOUTH DAILY     OB/GYN: Contraceptives - Progestins Passed - 07/09/2022  9:40 PM      Passed - Last BP in normal range    BP Readings from Last 1 Encounters:  04/12/22 112/60         Passed - Valid encounter within last 12 months    Recent Outpatient Visits           2 months ago Stress and adjustment reaction   [facility] [place], Salvadore Oxford, NP   6 months ago Encounter for general adult medical examination with abnormal findings   [facility] [place], Salvadore Oxford, NP   9 months ago Screening for colon cancer   [facility] [place], Salvadore Oxford, NP   3 years ago Acute pain of both knees   [facility] Danelle Berry, PA-C   3 years ago Dark urine   [facility] [place], Gerome Apley, [place]       Future Appointments             Tomorrow [place], Salvadore Oxford, NP [facility], [facility]            Passed - Patient is not a smoker

## 2022-07-11 ENCOUNTER — Ambulatory Visit: Payer: BC Managed Care – PPO | Admitting: Internal Medicine

## 2022-07-11 NOTE — Progress Notes (Deleted)
Subjective:    Patient ID: Alexis Suarez, female    DOB: 1975-09-11, 47 y.o.   MRN: [id number]  HPI  Patient presents to clinic today for 45-month follow-up of chronic conditions.  Chronic Right Knee Pain: Currently not an issue.  X-ray from 2/22 reviewed.  She does not follow with orthopedics.  Acne: Managed with Spironolactone and BenzaClin.  She follows with otology.  Review of Systems     Past Medical History:  Diagnosis Date   Asthma    ED eval in the past   Sciatica     Current Outpatient Medications  Medication Sig Dispense Refill   clindamycin-benzoyl peroxide (BENZACLIN) gel Apply topically 2 (two) times daily. 25 g 0   norethindrone (MICRONOR) 0.35 MG tablet TAKE 1 TABLET(0.35 MG) BY MOUTH DAILY 84 tablet 1   Phentermine-Topiramate 15-92 MG CP24 Take 1 capsule by mouth daily. 30 capsule 0   spironolactone (ALDACTONE) 100 MG tablet Take 100 mg by mouth daily.     No current facility-administered medications for this visit.    No Known Allergies  Family History  Problem Relation Age of Onset   Thyroid disease Mother    Hypertension Sister    Miscarriages / [place] Sister    Stroke Sister    Diabetes Father    Prostate cancer Paternal Grandfather    Cancer Paternal Grandfather        Prostate    Social History   Socioeconomic History   Marital status: Single    Spouse name: Not on file   Number of children: 1   Years of education: Not on file   Highest education level: Not on file  Occupational History   Not on file  Tobacco Use   Smoking status: Never   Smokeless tobacco: Never  Vaping Use   Vaping Use: Never used  Substance and Sexual Activity   Alcohol use: Yes    Alcohol/week: 0.0 standard drinks of alcohol    Comment: Rare   Drug use: No   Sexual activity: Yes    Partners: Male  Other Topics Concern   Not on file  Social History Narrative   Lives in [place] with mother, son and boyfriend in a one story home. Works for a Tax inspector.      Education: college degree and 1 year of grad school.      Diet - regular diet            Social Determinants of Health   Financial Resource Strain: Low Risk  (06/17/2018)   Overall Financial Resource Strain (CARDIA)    Difficulty of Paying Living Expenses: Not hard at all  Food Insecurity: No Food Insecurity (06/17/2018)   Hunger Vital Sign    Worried About Running Out of Food in the Last Year: Never true    Ran Out of Food in the Last Year: Never true  Transportation Needs: No Transportation Needs (06/17/2018)   PRAPARE - Administrator, Civil Service (Medical): No    Lack of Transportation (Non-Medical): No  Physical Activity: Sufficiently Active (06/17/2018)   Exercise Vital Sign    Days of Exercise per Week: 5 days    Minutes of Exercise per Session: 50 min  Stress: No Stress Concern Present (06/17/2018)   [employer] of Occupational Health - Occupational Stress Questionnaire    Feeling of Stress : Not at all  Social Connections: Moderately Integrated (06/17/2018)   Social Connection and Isolation Panel [NHANES]  Frequency of Communication with Friends and Family: More than three times a week    Frequency of Social Gatherings with Friends and Family: More than three times a week    Attends Religious Services: More than 4 times per year    Active Member of [employer] or Organizations: Yes    Attends Banker Meetings: More than 4 times per year    Marital Status: Never married  Intimate Partner Violence: Not At Risk (06/17/2018)   Humiliation, Afraid, Rape, and Kick questionnaire    Fear of Current or Ex-Partner: No    Emotionally Abused: No    Physically Abused: No    Sexually Abused: No     Constitutional: Denies fever, malaise, fatigue, headache or abrupt weight changes.  HEENT: Denies eye pain, eye redness, ear pain, ringing in the ears, wax buildup, runny nose, nasal congestion, bloody nose, or sore throat. Respiratory:  Denies difficulty breathing, shortness of breath, cough or sputum production.   Cardiovascular: Denies chest pain, chest tightness, palpitations or swelling in the hands or feet.  Gastrointestinal: Denies abdominal pain, bloating, constipation, diarrhea or blood in the stool.  GU: Denies urgency, frequency, pain with urination, burning sensation, blood in urine, odor or discharge. Musculoskeletal: Patient reports intermittent knee pain.  Denies decrease in range of motion, difficulty with gait, muscle pain or joint swelling.  Skin: Patient reports acne.  Denies redness, rashes, lesions or ulcercations.  Neurological: Denies dizziness, difficulty with memory, difficulty with speech or problems with balance and coordination.  Psych: Denies anxiety, depression, SI/HI.  No other specific complaints in a complete review of systems (except as listed in HPI above).  Objective:   Physical Exam  LMP 04/09/2012  Wt Readings from Last 3 Encounters:  04/12/22 207 lb (93.9 kg)  01/03/22 206 lb (93.4 kg)  10/12/21 214 lb (97.1 kg)    General: Appears their stated age, well developed, well nourished in NAD. Skin: Warm, dry and intact. No rashes, lesions or ulcerations noted. HEENT: Head: normal shape and size; Eyes: sclera white, no icterus, conjunctiva pink, PERRLA and EOMs intact; Ears: Tm's gray and intact, normal light reflex; Nose: mucosa pink and moist, septum midline; Throat/Mouth: Teeth present, mucosa pink and moist, no exudate, lesions or ulcerations noted.  Neck:  Neck supple, trachea midline. No masses, lumps or thyromegaly present.  Cardiovascular: Normal rate and rhythm. S1,S2 noted.  No murmur, rubs or gallops noted. No JVD or BLE edema. No carotid bruits noted. Pulmonary/Chest: Normal effort and positive vesicular breath sounds. No respiratory distress. No wheezes, rales or ronchi noted.  Abdomen: Soft and nontender. Normal bowel sounds. No distention or masses noted. Liver, spleen and  kidneys non palpable. Musculoskeletal: Normal range of motion. No signs of joint swelling. No difficulty with gait.  Neurological: Alert and oriented. Cranial nerves II-XII grossly intact. Coordination normal.  Psychiatric: Mood and affect normal. Behavior is normal. Judgment and thought content normal.     BMET    Component Value Date/Time   NA 139 01/03/2022 0908   NA 139 02/10/2014 0516   K 4.2 01/03/2022 0908   K 3.8 02/10/2014 0516   CL 106 01/03/2022 0908   CL 103 02/10/2014 0516   CO2 26 01/03/2022 0908   CO2 30 02/10/2014 0516   GLUCOSE 82 01/03/2022 0908   GLUCOSE 76 02/10/2014 0516   BUN 10 01/03/2022 0908   BUN 5 (L) 02/10/2014 0516   CREATININE 0.95 01/03/2022 0908   CALCIUM 9.2 01/03/2022 0908   CALCIUM  7.9 (L) 02/10/2014 0516   GFRNONAA 69 11/18/2018 0000   GFRAA 80 11/18/2018 0000    Lipid Panel     Component Value Date/Time   CHOL 167 01/03/2022 0908   TRIG 86 01/03/2022 0908   HDL 52 01/03/2022 0908   CHOLHDL 3.2 01/03/2022 0908   VLDL 10.0 09/24/2017 1539   LDLCALC 97 01/03/2022 0908    CBC    Component Value Date/Time   WBC 6.1 01/03/2022 0908   RBC 4.59 01/03/2022 0908   HGB 12.9 01/03/2022 0908   HGB 8.4 (L) 02/10/2014 1750   HCT 38.0 01/03/2022 0908   HCT 25.0 (L) 02/10/2014 0516   PLT 260 01/03/2022 0908   PLT 125 (L) 02/10/2014 0516   MCV 82.8 01/03/2022 0908   MCV 87 02/10/2014 0516   MCH 28.1 01/03/2022 0908   MCHC 33.9 01/03/2022 0908   RDW 12.1 01/03/2022 0908   RDW 14.4 02/10/2014 0516   LYMPHSABS 2.2 10/21/2019 1019   LYMPHSABS 1.6 02/10/2014 0516   MONOABS 0.6 10/21/2019 1019   MONOABS 0.9 02/10/2014 0516   EOSABS 0.4 10/21/2019 1019   EOSABS 0.2 02/10/2014 0516   BASOSABS 0.0 10/21/2019 1019   BASOSABS 0.0 02/10/2014 0516    Hgb A1C Lab Results  Component Value Date   HGBA1C 5.4 01/03/2022            Assessment & Plan:     RTC in 6 months, for your annual exam Nicki Reaper, NP

## 2022-09-04 ENCOUNTER — Other Ambulatory Visit: Payer: Self-pay | Admitting: Internal Medicine

## 2022-09-04 ENCOUNTER — Encounter: Payer: Self-pay | Admitting: Internal Medicine

## 2022-09-04 ENCOUNTER — Ambulatory Visit: Payer: BC Managed Care – PPO | Admitting: Internal Medicine

## 2022-09-04 VITALS — BP 106/78 | HR 68 | Temp 95.3°F | Ht 66.0 in | Wt 197.0 lb

## 2022-09-04 DIAGNOSIS — Z6830 Body mass index (BMI) 30.0-30.9, adult: Secondary | ICD-10-CM | POA: Diagnosis not present

## 2022-09-04 DIAGNOSIS — Z0289 Encounter for other administrative examinations: Secondary | ICD-10-CM

## 2022-09-04 DIAGNOSIS — Z6831 Body mass index (BMI) 31.0-31.9, adult: Secondary | ICD-10-CM

## 2022-09-04 DIAGNOSIS — E6609 Other obesity due to excess calories: Secondary | ICD-10-CM

## 2022-09-04 DIAGNOSIS — Z01818 Encounter for other preprocedural examination: Secondary | ICD-10-CM

## 2022-09-04 MED ORDER — PHENTERMINE-TOPIRAMATE ER 15-92 MG PO CP24: 1.0000 | ORAL_CAPSULE | Freq: Every day | ORAL | 0 refills | Status: DC

## 2022-09-04 NOTE — Progress Notes (Signed)
Subjective:    Patient ID: Alexis Suarez, female    DOB: 09/06/1975, 47 y.o.   MRN: [id number]  HPI  Patient presents to clinic today for surgical clearance.  She plans to have liposuction of her abdominal area.  Review of Systems     Past Medical History:  Diagnosis Date   Asthma    ED eval in the past   Sciatica     Current Outpatient Medications  Medication Sig Dispense Refill   clindamycin-benzoyl peroxide (BENZACLIN) gel Apply topically 2 (two) times daily. 25 g 0   norethindrone (MICRONOR) 0.35 MG tablet TAKE 1 TABLET(0.35 MG) BY MOUTH DAILY 84 tablet 1   Phentermine-Topiramate 15-92 MG CP24 Take 1 capsule by mouth daily. 30 capsule 0   spironolactone (ALDACTONE) 100 MG tablet Take 100 mg by mouth daily.     No current facility-administered medications for this visit.    No Known Allergies  Family History  Problem Relation Age of Onset   Thyroid disease Mother    Hypertension Sister    Miscarriages / [place] Sister    Stroke Sister    Diabetes Father    Prostate cancer Paternal Grandfather    Cancer Paternal Grandfather        Prostate    Social History   Socioeconomic History   Marital status: Single    Spouse name: Not on file   Number of children: 1   Years of education: Not on file   Highest education level: Bachelor's degree (e.g., BA, AB, BS)  Occupational History   Not on file  Tobacco Use   Smoking status: Never   Smokeless tobacco: Never  Vaping Use   Vaping Use: Never used  Substance and Sexual Activity   Alcohol use: Yes    Alcohol/week: 0.0 standard drinks of alcohol    Comment: Rare   Drug use: No   Sexual activity: Yes    Partners: Male  Other Topics Concern   Not on file  Social History Narrative   Lives in [place] with mother, son and boyfriend in a one story home. Works for a Teaching laboratory technician.      Education: college degree and 1 year of grad school.      Diet - regular diet            Social Determinants of  Health   Financial Resource Strain: Low Risk  (09/03/2022)   Overall Financial Resource Strain (CARDIA)    Difficulty of Paying Living Expenses: Not hard at all  Food Insecurity: No Food Insecurity (09/03/2022)   Hunger Vital Sign    Worried About Running Out of Food in the Last Year: Never true    Ran Out of Food in the Last Year: Never true  Transportation Needs: No Transportation Needs (09/03/2022)   PRAPARE - Administrator, Civil Service (Medical): No    Lack of Transportation (Non-Medical): No  Physical Activity: Insufficiently Active (09/03/2022)   Exercise Vital Sign    Days of Exercise per Week: 3 days    Minutes of Exercise per Session: 40 min  Stress: No Stress Concern Present (09/03/2022)   [employer] of Occupational Health - Occupational Stress Questionnaire    Feeling of Stress : Not at all  Social Connections: Moderately Integrated (09/03/2022)   Social Connection and Isolation Panel [NHANES]    Frequency of Communication with Friends and Family: More than three times a week    Frequency of Social Gatherings with Friends  and Family: Twice a week    Attends Religious Services: More than 4 times per year    Active Member of Clubs or Organizations: Yes    Attends Banker Meetings: More than 4 times per year    Marital Status: Never married  Intimate Partner Violence: Not At Risk (06/17/2018)   Humiliation, Afraid, Rape, and Kick questionnaire    Fear of Current or Ex-Partner: No    Emotionally Abused: No    Physically Abused: No    Sexually Abused: No     Constitutional: Denies fever, malaise, fatigue, headache or abrupt weight changes.  HEENT: Denies eye pain, eye redness, ear pain, ringing in the ears, wax buildup, runny nose, nasal congestion, bloody nose, or sore throat. Respiratory: Denies difficulty breathing, shortness of breath, cough or sputum production.   Cardiovascular: Denies chest pain, chest tightness, palpitations or swelling in  the hands or feet.  Gastrointestinal: Denies abdominal pain, bloating, constipation, diarrhea or blood in the stool.  GU: Denies urgency, frequency, pain with urination, burning sensation, blood in urine, odor or discharge. Musculoskeletal: Patient reports chronic right knee pain.  Denies decrease in range of motion, difficulty with gait, muscle pain or joint swelling.  Skin: Denies redness, rashes, lesions or ulcercations.  Neurological: Denies dizziness, difficulty with memory, difficulty with speech or problems with balance and coordination.  Psych: Denies anxiety, depression, SI/HI.  No other specific complaints in a complete review of systems (except as listed in HPI above).  Objective:   Physical Exam  BP 106/78 (BP Location: Left Arm, Patient Position: Sitting, Cuff Size: Normal)   Pulse 68   Temp (!) 95.3 F (35.2 C) (Temporal)   Ht 5\' 6"  (1.676 m)   Wt 197 lb (89.4 kg)   LMP 04/09/2012   BMI 31.80 kg/m   Wt Readings from Last 3 Encounters:  04/12/22 207 lb (93.9 kg)  01/03/22 206 lb (93.4 kg)  10/12/21 214 lb (97.1 kg)    General: Appears her stated age, obese, in NAD. Skin: Warm, dry and intact.  HEENT: Head: normal shape and size; Eyes: sclera white, no icterus, conjunctiva pink, PERRLA and EOMs intact;  Neck:  Neck supple, trachea midline. No masses, lumps or thyromegaly present.  Cardiovascular: Normal rate and rhythm. S1,S2 noted.  No murmur, rubs or gallops noted. No JVD or BLE edema.  Pulmonary/Chest: Normal effort and positive vesicular breath sounds. No respiratory distress. No wheezes, rales or ronchi noted.  Abdomen: Soft and nontender. Normal bowel sounds. No distention or masses noted.  Musculoskeletal: Strength 5/5 BUE/BLE.  No difficulty with gait.  Neurological: Alert and oriented. Cranial nerves II-XII grossly intact. Coordination normal.  Psychiatric: Mood and affect normal. Behavior is normal. Judgment and thought content normal.    BMET     Component Value Date/Time   NA 139 01/03/2022 0908   NA 139 02/10/2014 0516   K 4.2 01/03/2022 0908   K 3.8 02/10/2014 0516   CL 106 01/03/2022 0908   CL 103 02/10/2014 0516   CO2 26 01/03/2022 0908   CO2 30 02/10/2014 0516   GLUCOSE 82 01/03/2022 0908   GLUCOSE 76 02/10/2014 0516   BUN 10 01/03/2022 0908   BUN 5 (L) 02/10/2014 0516   CREATININE 0.95 01/03/2022 0908   CALCIUM 9.2 01/03/2022 0908   CALCIUM 7.9 (L) 02/10/2014 0516   GFRNONAA 69 11/18/2018 0000   GFRAA 80 11/18/2018 0000    Lipid Panel     Component Value Date/Time   CHOL  167 01/03/2022 0908   TRIG 86 01/03/2022 0908   HDL 52 01/03/2022 0908   CHOLHDL 3.2 01/03/2022 0908   VLDL 10.0 09/24/2017 1539   LDLCALC 97 01/03/2022 0908    CBC    Component Value Date/Time   WBC 6.1 01/03/2022 0908   RBC 4.59 01/03/2022 0908   HGB 12.9 01/03/2022 0908   HGB 8.4 (L) 02/10/2014 1750   HCT 38.0 01/03/2022 0908   HCT 25.0 (L) 02/10/2014 0516   PLT 260 01/03/2022 0908   PLT 125 (L) 02/10/2014 0516   MCV 82.8 01/03/2022 0908   MCV 87 02/10/2014 0516   MCH 28.1 01/03/2022 0908   MCHC 33.9 01/03/2022 0908   RDW 12.1 01/03/2022 0908   RDW 14.4 02/10/2014 0516   LYMPHSABS 2.2 10/21/2019 1019   LYMPHSABS 1.6 02/10/2014 0516   MONOABS 0.6 10/21/2019 1019   MONOABS 0.9 02/10/2014 0516   EOSABS 0.4 10/21/2019 1019   EOSABS 0.2 02/10/2014 0516   BASOSABS 0.0 10/21/2019 1019   BASOSABS 0.0 02/10/2014 0516    Hgb A1C Lab Results  Component Value Date   HGBA1C 5.4 01/03/2022            Assessment & Plan:   Surgical clearance for liposuction:  Will obtain CBC, c-Met, PT and Aptt per surgeon recommendation Physical form will be filled out and faxed back once results are available  RTC in 4 months for your annual exam Nicki Reaper, NP

## 2022-09-04 NOTE — Assessment & Plan Note (Signed)
She has a planned liposuction procedure coming up Encourage diet and exercise for weight loss

## 2022-09-04 NOTE — Patient Instructions (Signed)
Liposuction, Care After After liposuction, it is common to have: Swelling. Bruising. Pain. Numbness. Fluid leaking from the incisions. Follow these instructions at home: Medicines Take over-the-counter and prescription medicines only as told by your health care provider. Ask your provider if the medicine prescribed to you: Requires you to avoid driving or using machinery. Can cause constipation. You may need to take these actions to prevent or treat constipation. Drink enough fluid to keep your pee (urine) pale yellow. Take over-the-counter or prescription medicines. Eat foods that are high in fiber, such as beans, whole grains, and fresh fruits and vegetables. Limit foods that are high in fat and processed sugars, such as fried or sweet foods. Incision care Follow instructions from your provider about how to take care of your incisions. Make sure you: Wash your hands with soap and water for at least 20 seconds before and after you change your compression garment or bandages (dressings). If soap and water are not available, use hand sanitizer. Change your compression garment or dressings as told by your provider. Leave stitches (sutures), skin glue, or tape strips in place. These skin closures may need to stay in place for 2 weeks or longer. If tape strip edges start to loosen and curl up, you may trim the loose edges. Do not remove tape strips completely unless your provider tells you to do that. Check your incisions every day for signs of infection. Check for: More redness, swelling, or pain. More fluid or blood. Warmth. Pus or a bad smell. Do not take baths, swim, or use a hot tub until your provider approves. Ask your provider if you may take showers. You may only be allowed to take sponge baths. Activity You may have to avoid lifting. Ask your provider how much you can safely lift. Rest as told by your provider. Do not sit for a long time without moving. Get up to take short walks  every 1-2 hours. This will improve blood flow and breathing. Ask for help if you feel weak or unsteady. Return to your normal activities as told by your provider. Ask your provider what activities are safe for you. General instructions Do not use any products that contain nicotine or tobacco. These products include cigarettes, chewing tobacco, and vaping devices, such as e-cigarettes. If you need help quitting, ask your provider. Stay out of the sun. Your skin is more sensitive to the sun after surgery. Use sunscreen and wear a wide-brimmed hat to protect your skin. Stay at a healthy weight and eat a healthy diet. This helps the results of your procedure last longer. Wear a compression garment and compression stockings as told by your provider. The stockings help to prevent blood clots and reduce swelling in your legs. Keep all follow-up visits. Your provider will closely watch your healing and check for any problems. Your provider may give you more instructions. Make sure you know what you can and cannot do. Contact a health care provider if: Your pain does not get better, or it gets worse. You have fluid or blood coming from an incision for longer than your provider said to expect. You have any signs of infection near your incisions. You vomit. You have ringing in your ears (tinnitus). Your muscles start to twitch. Get help right away if: You have trouble breathing. You have a red, warm area on your calf. These symptoms may be an emergency. Get help right away. Call 911. Do not wait to see if the symptoms will go away. Do  not drive yourself to the hospital. This information is not intended to replace advice given to you by your health care provider. Make sure you discuss any questions you have with your health care provider. Document Revised: 11/20/2021 Document Reviewed: 11/20/2021 Elsevier Patient Education  2024 [employer].

## 2022-09-05 LAB — COMPLETE METABOLIC PANEL WITH GFR
AG Ratio: 1.5 (calc) (ref 1.0–2.5)
ALT: 11 U/L (ref 6–29)
AST: 12 U/L (ref 10–35)
Albumin: 4 g/dL (ref 3.6–5.1)
Alkaline phosphatase (APISO): 88 U/L (ref 31–125)
BUN: 14 mg/dL (ref 7–25)
CO2: 24 mmol/L (ref 20–32)
Calcium: 9.2 mg/dL (ref 8.6–10.2)
Chloride: 105 mmol/L (ref 98–110)
Creat: 0.9 mg/dL (ref 0.50–0.99)
Globulin: 2.7 g/dL (calc) (ref 1.9–3.7)
Glucose, Bld: 84 mg/dL (ref 65–99)
Potassium: 4.1 mmol/L (ref 3.5–5.3)
Sodium: 137 mmol/L (ref 135–146)
Total Bilirubin: 0.5 mg/dL (ref 0.2–1.2)
Total Protein: 6.7 g/dL (ref 6.1–8.1)
eGFR: 80 mL/min/{1.73_m2} (ref >=60)

## 2022-09-05 LAB — CBC
HCT: 40.2 % (ref 35.0–45.0)
Hemoglobin: 12.9 g/dL (ref 11.7–15.5)
MCH: 27.8 pg (ref 27.0–33.0)
MCHC: 32.1 g/dL (ref 32.0–36.0)
MCV: 86.6 fL (ref 80.0–100.0)
MPV: 10.6 fL (ref 7.5–12.5)
Platelets: 245 10*3/uL (ref 140–400)
RBC: 4.64 10*6/uL (ref 3.80–5.10)
RDW: 11.9 % (ref 11.0–15.0)
WBC: 7.1 10*3/uL (ref 3.8–10.8)

## 2022-09-05 LAB — PROTIME-INR
INR: 0.9
Prothrombin Time: 10.3 s (ref 9.0–11.5)

## 2022-09-05 LAB — APTT: aPTT: 26 s (ref 23–32)

## 2022-09-06 ENCOUNTER — Encounter: Payer: Self-pay | Admitting: Internal Medicine

## 2022-09-13 ENCOUNTER — Ambulatory Visit: Payer: BC Managed Care – PPO | Admitting: Internal Medicine

## 2022-10-19 ENCOUNTER — Encounter: Payer: Self-pay | Admitting: Internal Medicine

## 2022-11-07 ENCOUNTER — Encounter: Payer: Self-pay | Admitting: Internal Medicine

## 2022-11-07 ENCOUNTER — Ambulatory Visit: Payer: BC Managed Care – PPO | Admitting: Internal Medicine

## 2022-11-07 VITALS — BP 108/62 | HR 84 | Temp 96.6°F | Wt 195.0 lb

## 2022-11-07 DIAGNOSIS — M79602 Pain in left arm: Secondary | ICD-10-CM | POA: Diagnosis not present

## 2022-11-07 DIAGNOSIS — M79601 Pain in right arm: Secondary | ICD-10-CM | POA: Diagnosis not present

## 2022-11-07 MED ORDER — PHENTERMINE-TOPIRAMATE ER 15-92 MG PO CP24: 1.0000 | ORAL_CAPSULE | Freq: Every day | ORAL | 0 refills | Status: DC

## 2022-11-07 MED ORDER — PREDNISONE 10 MG PO TABS: ORAL_TABLET | ORAL | 0 refills | Status: DC

## 2022-11-07 NOTE — Progress Notes (Signed)
Subjective:    Patient ID: Alexis Suarez, female    DOB: 1976-01-21, 47 y.o.   MRN: [id number]  HPI  Patient presents to clinic today with complaint bilateral arm pain.  This started 3 months ago.  She describes the pain as soreness. The pain is worse with certain movements. She denies numbness, tingling or weakness of her upper extremities. She denies any injury to the area but noticed this after a fishing trip. Pt has tried advil and diclofenac gel with some relief of symptoms.    Review of Systems     Past Medical History:  Diagnosis Date   Asthma    ED eval in the past   Sciatica     Current Outpatient Medications  Medication Sig Dispense Refill   norethindrone (MICRONOR) 0.35 MG tablet TAKE 1 TABLET(0.35 MG) BY MOUTH DAILY 84 tablet 1   Phentermine-Topiramate 15-92 MG CP24 Take 1 capsule by mouth daily. 30 capsule 0   No current facility-administered medications for this visit.    No Known Allergies  Family History  Problem Relation Age of Onset   Thyroid disease Mother    Hypertension Sister    Miscarriages / [place] Sister    Stroke Sister    Diabetes Father    Prostate cancer Paternal Grandfather    Cancer Paternal Grandfather        Prostate    Social History   Socioeconomic History   Marital status: Single    Spouse name: Not on file   Number of children: 1   Years of education: Not on file   Highest education level: Bachelor's degree (e.g., BA, AB, BS)  Occupational History   Not on file  Tobacco Use   Smoking status: Never   Smokeless tobacco: Never  Vaping Use   Vaping status: Never Used  Substance and Sexual Activity   Alcohol use: Yes    Alcohol/week: 0.0 standard drinks of alcohol    Comment: Rare   Drug use: No   Sexual activity: Yes    Partners: Male  Other Topics Concern   Not on file  Social History Narrative   Lives in [place] with mother, son and boyfriend in a one story home. Works for a Teaching laboratory technician.       Education: college degree and 1 year of grad school.      Diet - regular diet            Social Determinants of Health   Financial Resource Strain: Low Risk  (09/03/2022)   Overall Financial Resource Strain (CARDIA)    Difficulty of Paying Living Expenses: Not hard at all  Food Insecurity: No Food Insecurity (09/03/2022)   Hunger Vital Sign    Worried About Running Out of Food in the Last Year: Never true    Ran Out of Food in the Last Year: Never true  Transportation Needs: No Transportation Needs (09/03/2022)   PRAPARE - Administrator, Civil Service (Medical): No    Lack of Transportation (Non-Medical): No  Physical Activity: Insufficiently Active (09/03/2022)   Exercise Vital Sign    Days of Exercise per Week: 3 days    Minutes of Exercise per Session: 40 min  Stress: No Stress Concern Present (09/03/2022)   [employer] of Occupational Health - Occupational Stress Questionnaire    Feeling of Stress : Not at all  Social Connections: Moderately Integrated (09/03/2022)   Social Connection and Isolation Panel [NHANES]    Frequency of  Communication with Friends and Family: More than three times a week    Frequency of Social Gatherings with Friends and Family: Twice a week    Attends Religious Services: More than 4 times per year    Active Member of [employer] or Organizations: Yes    Attends Engineer, structural: More than 4 times per year    Marital Status: Never married  Intimate Partner Violence: Not At Risk (06/17/2018)   Humiliation, Afraid, Rape, and Kick questionnaire    Fear of Current or Ex-Partner: No    Emotionally Abused: No    Physically Abused: No    Sexually Abused: No     Constitutional: Denies fever, malaise, fatigue, headache or abrupt weight changes.  Respiratory: Denies difficulty breathing, shortness of breath, cough or sputum production.   Cardiovascular: Denies chest pain, chest tightness, palpitations or swelling in the hands or feet.   Musculoskeletal: Patient reports bilateral arm pain.  Denies decrease in range of motion, difficulty with gait, or joint pain and swelling.  Skin: Denies redness, rashes, lesions or ulcercations.  Neurological: Denies numbness, tingling or weakness or problems with balance and coordination.    No other specific complaints in a complete review of systems (except as listed in HPI above).  Objective:   Physical Exam BP 108/62 (BP Location: Left Arm, Patient Position: Sitting, Cuff Size: Normal)   Pulse 84   Temp (!) 96.6 F (35.9 C) (Temporal)   Wt 195 lb (88.5 kg)   LMP 04/09/2012   BMI 31.47 kg/m   Wt Readings from Last 3 Encounters:  09/04/22 197 lb (89.4 kg)  04/12/22 207 lb (93.9 kg)  01/03/22 206 lb (93.4 kg)    General: Appears her stated age, obese, in NAD. Cardiovascular: Normal rate and rhythm.  Pulmonary/Chest: Normal effort and positive vesicular breath sounds. No respiratory distress. No wheezes, rales or ronchi noted.  Musculoskeletal: Normal internal and external rotation of bilateral shoulders.  Pain with palpation of her bilateral proximal biceps tendon.  Shoulder shrug equal.  Strength 5/5 BUE.  Handgrips equal.  No difficulty with gait.  Neurological: Alert and oriented. Coordination normal.     BMET    Component Value Date/Time   NA 137 09/04/2022 0824   NA 139 02/10/2014 0516   K 4.1 09/04/2022 0824   K 3.8 02/10/2014 0516   CL 105 09/04/2022 0824   CL 103 02/10/2014 0516   CO2 24 09/04/2022 0824   CO2 30 02/10/2014 0516   GLUCOSE 84 09/04/2022 0824   GLUCOSE 76 02/10/2014 0516   BUN 14 09/04/2022 0824   BUN 5 (L) 02/10/2014 0516   CREATININE 0.90 09/04/2022 0824   CALCIUM 9.2 09/04/2022 0824   CALCIUM 7.9 (L) 02/10/2014 0516   GFRNONAA 69 11/18/2018 0000   GFRAA 80 11/18/2018 0000    Lipid Panel     Component Value Date/Time   CHOL 167 01/03/2022 0908   TRIG 86 01/03/2022 0908   HDL 52 01/03/2022 0908   CHOLHDL 3.2 01/03/2022 0908    VLDL 10.0 09/24/2017 1539   LDLCALC 97 01/03/2022 0908    CBC    Component Value Date/Time   WBC 7.1 09/04/2022 0824   RBC 4.64 09/04/2022 0824   HGB 12.9 09/04/2022 0824   HGB 8.4 (L) 02/10/2014 1750   HCT 40.2 09/04/2022 0824   HCT 25.0 (L) 02/10/2014 0516   PLT 245 09/04/2022 0824   PLT 125 (L) 02/10/2014 0516   MCV 86.6 09/04/2022 0824   MCV  87 02/10/2014 0516   MCH 27.8 09/04/2022 0824   MCHC 32.1 09/04/2022 0824   RDW 11.9 09/04/2022 0824   RDW 14.4 02/10/2014 0516   LYMPHSABS 2.2 10/21/2019 1019   LYMPHSABS 1.6 02/10/2014 0516   MONOABS 0.6 10/21/2019 1019   MONOABS 0.9 02/10/2014 0516   EOSABS 0.4 10/21/2019 1019   EOSABS 0.2 02/10/2014 0516   BASOSABS 0.0 10/21/2019 1019   BASOSABS 0.0 02/10/2014 0516    Hgb A1C Lab Results  Component Value Date   HGBA1C 5.4 01/03/2022           Assessment & Plan:   Bilateral arm pain:  Likely tendinitis Rx for Pred taper x 9 days Avoid repetitive motions Encouraged ice and stretching  RTC in 2 months for your annual exam Nicki Reaper, NP

## 2022-11-07 NOTE — Patient Instructions (Signed)
Proximal Biceps Tendinitis and Tenosynovitis  The proximal biceps tendon is a strong cord of tissue that connects the biceps muscle on the front of the upper arm to the shoulder blade. Tendinitis is inflammation of a tendon. Tenosynovitis is inflammation of the lining around the tendon (tendon sheath). These conditions often occur at the same time, and they can make it hard to bend the elbow or to turn the hand palm up (supination). Proximal biceps tendinitis and tenosynovitis are usually caused by overusing the shoulder joint and the biceps muscle. These conditions usually heal within 6 weeks. Proximal biceps tendinitis may include a grade 1 or grade 2 strain of the tendon. A grade 1 strain is mild. There is a slight pull of the tendon without any stretching or noticeable tearing of the tendon. There is usually no loss of biceps muscle strength. A grade 2 strain is moderate. There is a small tear in the tendon. The tendon is stretched, and biceps strength is usually decreased. What are the causes? This condition may be caused by: Suddenly increasing the number of activities in which you use your elbow and biceps muscle. Sudden increase in the intensity of activities may also cause the condition. Overuse of the biceps muscle. This can happen when you do the same movements over and over, such as: Turning the palm of the hand up. Forceful straightening of the elbow. Bending the elbow. A direct, forceful hit or injury to the elbow. This is rare. What increases the risk? The following factors may make you more likely to develop this condition: Playing contact sports. Playing sports that involve throwing and overhead movements, including racket sports, gymnastics, weight lifting, or bodybuilding. Doing physical labor. Having poor strength and flexibility of the arm and shoulder. What are the signs or symptoms? Symptoms of this condition may include: Pain and inflammation in the front of the  shoulder. A feeling of warmth in the front of the shoulder. Inability to move the shoulder and elbow. This is called limited range of motion. A crackling sound (crepitus) when you move or touch the shoulder or the upper arm. In some cases, symptoms may return after treatment, and they may be long-lasting (chronic). How is this diagnosed? This condition is diagnosed based on: Your symptoms. Your medical history. Physical exam. X-ray, MRI, or ultrasound, if needed. How is this treated? Treatment for this condition depends on the severity of your injury. It may include: Resting the injured arm. Icing the injured area. Doing physical therapy. Your health care provider may also use: Medicines to treat pain and inflammation. Sound waves to treat the injured muscle (ultrasound therapy). Medicines that are injected into the muscle (corticosteroids). Medicines that numb the area (local anesthetics). Surgery. This is done if other treatments have not worked. Follow these instructions at home: Managing pain, stiffness, and swelling     If told, put ice on the injured area. Put ice in a plastic bag. Place a towel between your skin and the bag. Leave the ice on for 20 minutes, 2-3 times a day. If told, apply heat to the affected area before you exercise. Use the heat source that your provider recommends, such as a moist heat pack or a heating pad. Place a towel between your skin and the heat source. Leave the heat on for 20-30 minutes. If your skin turns bright red, remove the ice or heat right away to prevent damage to the area. The risk of damage is higher if you cannot feel pain, heat,  or cold. Move your fingers often to reduce stiffness and swelling. Raise (elevate) the injured area above the level of your heart while you are lying down. Activity You may have to avoid lifting. Ask your provider how much you can safely lift. Avoid activities that cause pain or make your condition  worse. Do exercises as told by your provider. Return to your normal activities as told by your provider. Ask your provider what activities are safe for you. General instructions Take over-the-counter and prescription medicines only as told by your provider. Do not use any products that contain nicotine or tobacco. These products include cigarettes, chewing tobacco, and vaping devices, such as e-cigarettes. These can delay healing. If you need help quitting, ask your provider. Keep all follow-up visits. Your provider will monitor your injury and activity. How is this prevented? Warm up and stretch before being active. Cool down and stretch after being active. Give your body time to rest between periods of activity. Make sure any equipment that you use is fitted to you. Be safe and responsible while being active to avoid falls. Maintain physical fitness, including: Strength. Flexibility. Heart health (cardiovascular fitness). The ability to use muscles for a long time (endurance). Contact a health care provider if: You have symptoms that get worse or do not get better after 2 weeks of treatment. You develop new symptoms. You develop severe pain. This information is not intended to replace advice given to you by your health care provider. Make sure you discuss any questions you have with your health care provider. Document Revised: 09/21/2021 Document Reviewed: 09/21/2021 Elsevier Patient Education  2024 [employer].

## 2022-12-26 ENCOUNTER — Ambulatory Visit
Admission: RE | Admit: 2022-12-26 | Discharge: 2022-12-26 | Disposition: A | Discharge status: Home / Self Care | Payer: BC Managed Care – PPO | Source: Ambulatory Visit | Attending: Internal Medicine | Admitting: Internal Medicine

## 2022-12-26 ENCOUNTER — Encounter: Payer: Self-pay | Admitting: Internal Medicine

## 2022-12-26 ENCOUNTER — Ambulatory Visit: Payer: BC Managed Care – PPO | Admitting: Internal Medicine

## 2022-12-26 ENCOUNTER — Ambulatory Visit
Admission: RE | Admit: 2022-12-26 | Discharge: 2022-12-26 | Disposition: A | Discharge status: Home / Self Care | Payer: BC Managed Care – PPO | Attending: Internal Medicine | Admitting: Internal Medicine

## 2022-12-26 VITALS — BP 106/64 | Ht 66.0 in | Wt 201.0 lb

## 2022-12-26 DIAGNOSIS — M19012 Primary osteoarthritis, left shoulder: Secondary | ICD-10-CM | POA: Diagnosis not present

## 2022-12-26 DIAGNOSIS — M79621 Pain in right upper arm: Secondary | ICD-10-CM | POA: Diagnosis not present

## 2022-12-26 DIAGNOSIS — M79622 Pain in left upper arm: Secondary | ICD-10-CM | POA: Insufficient documentation

## 2022-12-26 DIAGNOSIS — M25512 Pain in left shoulder: Secondary | ICD-10-CM | POA: Diagnosis not present

## 2022-12-26 DIAGNOSIS — M25511 Pain in right shoulder: Secondary | ICD-10-CM | POA: Diagnosis not present

## 2022-12-26 DIAGNOSIS — M19011 Primary osteoarthritis, right shoulder: Secondary | ICD-10-CM | POA: Diagnosis not present

## 2022-12-26 MED ORDER — METHOCARBAMOL 500 MG PO TABS
500.0000 mg | ORAL_TABLET | Freq: Three times a day (TID) | ORAL | 0 refills | Status: DC | PRN
Start: 2022-12-26 — End: 2023-03-19

## 2022-12-26 NOTE — Patient Instructions (Signed)
Proximal Biceps Tendinitis and Tenosynovitis Rehab Ask your health care provider which exercises are safe for you. Do exercises exactly as told by your provider and adjust them as directed. It is normal to feel mild stretching, pulling, tightness, or discomfort as you do these exercises. Stop right away if you feel sudden pain or your pain gets worse. Do not begin these exercises until told by your provider. Stretching and range-of-motion exercises These exercises warm up your muscles and joints. They can help improve the movement and flexibility of your arm and shoulder. They may also help to relieve pain and stiffness. Forearm rotation, supination  Stand up or sit with your left / right elbow bent in a 90-degree angle (right angle). Turn (rotate) your left / right palm up (supination) until you cannot rotate it anymore. Then, use your other hand to help turn your left / right forearm more. Hold this position for __________ seconds. Slowly return to the starting position. Repeat __________ times. Complete this exercise __________ times a day. Forearm rotation, pronation  Stand up or sit with your left / right elbow bent in a 90-degree angle (right angle). Turn (rotate) your left / right palm down (pronation) until you cannot rotate it anymore. Then, use your other hand to help turn your left / right forearm more. Hold this position for __________ seconds. Slowly return to the starting position. Elbow range of motion  Stand or sit with your left / right elbow bent in a 90-degree angle (right angle). Position your forearm so that the thumb is facing the ceiling (neutral position). Slowly bend your elbow as far as you can until you feel a stretch or cannot go any farther. Hold this position for __________ seconds. Slowly straighten your elbow as far as you can until you feel a stretch or cannot go any farther. Hold this position for __________ seconds. Repeat __________ times. Complete this  exercise __________ times a day. Biceps stretch  Stand facing a wall, or stand by a door frame. Raise your left / right arm out to your side, to your shoulder height. Place the thumb side of your hand against the wall. Your palm should be facing the floor (palm down). Keeping your arm straight, rotate your body in the opposite direction of the raised arm until you feel a gentle stretch in your biceps. Hold this position for __________ seconds. Slowly return to the starting position. Repeat __________ times. Complete this exercise __________ times a day. Shoulder pendulum  Stand near a table or counter that you can hold onto for balance. Bend forward at the waist and let your left / right arm hang straight down. Use your other arm to support you and help you stay balanced. Relax your left / right arm and shoulder muscles. Move your hips and your trunk so your left / right arm swings freely. Your arm should swing because of the motion of your body, not because you are using your arm or shoulder muscles. Keep moving your hips and trunk so your arm swings in the following directions, as told by your provider: Side to side. Forward and backward. In clockwise and counterclockwise circles. Repeat __________ times. Complete this exercise __________ times a day. Shoulder flexion, assisted  Stand facing a wall. Put your left / right palm on the wall. Slowly move your left / right hand up the wall (flexion). Stop when you feel a stretch in your shoulder, or when you reach the angle that is recommended by your provider.  Use your other hand to help raise your arm, if needed (assisted). As your hand gets higher, you may need to step closer to the wall. Avoid shrugging or lifting your shoulder up as you raise your arm. To do this, keep your shoulder blade tucked down toward your spine. Hold this position for __________ seconds. Slowly return to the starting position. Use your other arm to help, if  needed. Repeat __________ times. Complete this exercise __________ times a day. Shoulder flexion  Stand with your left / right arm hanging down at your side. Keep your arm straight as you lift your arm forward and toward the ceiling (flexion). Hold this position for __________ seconds. Slowly return to the starting position. Repeat __________ times. Complete this exercise __________ times a day. Sleeper stretch, assisted  Lie on your left / right side (injured side) with your hips and knees bent and your left / right arm straight in front of you. Bend your elbow to a 90-degree angle (right angle), so your fingers are pointing to the ceiling. Use your other hand to gently push your arm toward the floor (assisted), stopping when you feel a gentle stretch. Keep your shoulder blades lightly squeezed together during the exercise. Hold this position for __________ seconds. Slowly return to the starting position. Repeat __________ times. Complete this exercise __________ times a day. Strengthening exercises These exercises build strength and endurance in your arm and shoulder. Endurance is the ability to use your muscles for a long time, even after they get tired. Biceps curls You can use a weight or an exercise band for this exercise. Sit on a stable chair without armrests, or stand up. Hold a __________ lb / kg weight in your left / right hand, or hold an exercise band with both hands. Your palms should face up toward the ceiling at the starting position. Bend your left / right elbow and move your hand up toward your shoulder. Keep your other arm straight down, in the starting position. Hold this position for __________ seconds. Slowly return to the starting position. Repeat __________ times. Complete this exercise __________ times a day. Internal shoulder rotation You will use an exercise band secured to a stable object at waist height for this exercise. A door and doorframe work  well. Stand sideways next to a door with your left / right arm closest to the door, holding the exercise band in your hand. With your elbow bent in a 90-degree angle (right angle) and keeping your elbow at your side, bring your hand toward your belly (internal rotation). Make sure your wrist is staying straight as you do this exercise. Hold this position for __________ seconds. Slowly return to the starting position. Repeat __________ times. Complete this exercise __________ times a day. External shoulder rotation You will use an exercise band secured to a stable object at waist height for this exercise. A door and doorframe work well. Stand sideways next to a door with your left / right arm away from the door, holding the exercise band in your hand. With your elbow bent in a 90-degree angle (right angle) and keeping your elbow at your side, swing your arm away from your body (external rotation). Make sure your wrist is staying straight as you do this exercise. Hold this position for __________ seconds. Slowly return to the starting position. Repeat __________ times. Complete this exercise __________ times a day. External shoulder rotation, side-lying You will use a weight to do this exercise. Lie on your uninjured  side with your left / right arm at your side. Bend your elbow to a 90-degree angle (right angle). Hold a __________ lb / kg weight in your left / right hand. Keeping your elbow at your side, raise your arm toward the ceiling (external rotation). Make sure your wrist is staying straight as you do this exercise. Hold this position for __________ seconds. Slowly return to the starting position. Repeat __________ times. Complete this exercise __________ times a day. Scapular retraction Scapular retraction is the process of pulling the shoulder blades (scapulae) toward each other, and toward the spine. You will need an exercise band to do this exercise. Sit in a stable chair without  armrests, or stand up. Secure an exercise band to a stable object in front of you so the band is at shoulder height. Hold one end of the exercise band in each hand. Squeeze your shoulder blades together and move your elbows slightly behind you (retraction). Do not shrug your shoulders upward while you do this. Hold this position for __________ seconds. Slowly return to the starting position. Repeat __________ times. Complete this exercise __________ times a day. Scapular protraction, supine Scapular protraction is the process of moving your shoulder blades away from each other, and away from the spine, while you lie on your back (supine position). Lie on your back on a firm surface. Hold a __________ lb / kg weight in your left / right hand. Raise your left / right arm straight into the air so your hand is directly above your shoulder joint. Push the weight into the air so your shoulder (scapula) lifts off the surface that you are lying on. Think of trying to punch the ceiling by only moving your scapula forward (protraction). Do not move your head, neck, or back. Hold this position for __________ seconds. Slowly return to the starting position. Repeat __________ times. Complete this exercise __________ times a day. This information is not intended to replace advice given to you by your health care provider. Make sure you discuss any questions you have with your health care provider. Document Revised: 09/21/2021 Document Reviewed: 09/21/2021 Elsevier Patient Education  2024 [employer].

## 2022-12-26 NOTE — Progress Notes (Signed)
Subjective:    Patient ID: Alexis Suarez, female    DOB: 1975-10-16, 47 y.o.   MRN: [id number]  HPI  Discussed the use of AI scribe software for clinical note transcription with the patient, who gave verbal consent to proceed.  The patient presents with bilateral shoulder and upper arm discomfort. She reports that the symptoms began after a fishing trip over the summer. The discomfort is described as an internal sensation, not painful to touch, but exacerbated by certain movements such as reaching or lying on the stomach. The patient denies any tingling, weakness, or pain in other joints.  Despite a course of prednisone, the patient reports persistent discomfort, although she notes an improvement in her range of motion and a decrease in pain severity. She has been managing the discomfort with over-the-counter medications. The patient denies any neck pain or other new symptoms.       Review of Systems     Past Medical History:  Diagnosis Date   Asthma    ED eval in the past   Sciatica     Current Outpatient Medications  Medication Sig Dispense Refill   norethindrone (MICRONOR) 0.35 MG tablet TAKE 1 TABLET(0.35 MG) BY MOUTH DAILY 84 tablet 1   Phentermine-Topiramate 15-92 MG CP24 Take 1 capsule by mouth daily. 30 capsule 0   predniSONE (DELTASONE) 10 MG tablet Take 3 tabs on days 1-3, 2 tabs on days 4-6, 1 tab on days 7-9 18 tablet 0   No current facility-administered medications for this visit.    No Known Allergies  Family History  Problem Relation Age of Onset   Thyroid disease Mother    Hypertension Sister    Miscarriages / [place] Sister    Stroke Sister    Diabetes Father    Prostate cancer Paternal Grandfather    Cancer Paternal Grandfather        Prostate    Social History   Socioeconomic History   Marital status: Single    Spouse name: Not on file   Number of children: 1   Years of education: Not on file   Highest education level: Bachelor's degree  (e.g., BA, AB, BS)  Occupational History   Not on file  Tobacco Use   Smoking status: Never   Smokeless tobacco: Never  Vaping Use   Vaping status: Never Used  Substance and Sexual Activity   Alcohol use: Yes    Alcohol/week: 0.0 standard drinks of alcohol    Comment: Rare   Drug use: No   Sexual activity: Yes    Partners: Male  Other Topics Concern   Not on file  Social History Narrative   Lives in [place] with mother, son and boyfriend in a one story home. Works for a Teaching laboratory technician.      Education: college degree and 1 year of grad school.      Diet - regular diet            Social Determinants of Health   Financial Resource Strain: Low Risk  (12/25/2022)   Overall Financial Resource Strain (CARDIA)    Difficulty of Paying Living Expenses: Not hard at all  Food Insecurity: No Food Insecurity (12/25/2022)   Hunger Vital Sign    Worried About Running Out of Food in the Last Year: Never true    Ran Out of Food in the Last Year: Never true  Transportation Needs: No Transportation Needs (12/25/2022)   PRAPARE - Transportation    Lack of  Transportation (Medical): No    Lack of Transportation (Non-Medical): No  Physical Activity: Insufficiently Active (12/25/2022)   Exercise Vital Sign    Days of Exercise per Week: 2 days    Minutes of Exercise per Session: 40 min  Stress: No Stress Concern Present (12/25/2022)   [employer] of Occupational Health - Occupational Stress Questionnaire    Feeling of Stress : Only a little  Social Connections: Moderately Integrated (12/25/2022)   Social Connection and Isolation Panel [NHANES]    Frequency of Communication with Friends and Family: More than three times a week    Frequency of Social Gatherings with Friends and Family: More than three times a week    Attends Religious Services: More than 4 times per year    Active Member of [employer] or Organizations: Yes    Attends Banker Meetings: More than 4 times per  year    Marital Status: Never married  Intimate Partner Violence: Not At Risk (06/17/2018)   Humiliation, Afraid, Rape, and Kick questionnaire    Fear of Current or Ex-Partner: No    Emotionally Abused: No    Physically Abused: No    Sexually Abused: No     Constitutional: Denies fever, malaise, fatigue, headache or abrupt weight changes.  Respiratory: Denies difficulty breathing, shortness of breath, cough or sputum production.   Cardiovascular: Denies chest pain, chest tightness, palpitations or swelling in the hands or feet.  Musculoskeletal: Patient reports bilateral arm pain.  Denies decrease in range of motion, difficulty with gait, or joint swelling.  Skin: Denies redness, rashes, lesions or ulcercations.  Neurological: Denies numbness, tingling, weakness or problems with balance and coordination.    No other specific complaints in a complete review of systems (except as listed in HPI above).  Objective:   Physical Exam   BP 106/64   Ht 5\' 6"  (1.676 m)   Wt 201 lb (91.2 kg)   LMP 04/09/2012   BMI 32.44 kg/m   Wt Readings from Last 3 Encounters:  11/07/22 195 lb (88.5 kg)  09/04/22 197 lb (89.4 kg)  04/12/22 207 lb (93.9 kg)    General: Appears her stated age, obese in NAD. Cardiovascular: Normal rate and rhythm. S1,S2 noted.  No murmur, rubs or gallops noted.  Pulmonary/Chest: Normal effort and positive vesicular breath sounds. No respiratory distress. No wheezes, rales or ronchi noted.  Musculoskeletal: Normal flexion, extension, rotation and lateral bending of cervical spine.  No bony tenderness noted over cervical spine.  Normal internal and external rotation bilateral shoulders.  No pain with palpation of the shoulders.  Negative drop can test bilaterally.  No signs of joint swelling.  Strength 5/5 BUE.  Handgrips equal.  No difficulty with gait.  Neurological: Alert and oriented.  Coordination normal.     BMET    Component Value Date/Time   NA 137  09/04/2022 0824   NA 139 02/10/2014 0516   K 4.1 09/04/2022 0824   K 3.8 02/10/2014 0516   CL 105 09/04/2022 0824   CL 103 02/10/2014 0516   CO2 24 09/04/2022 0824   CO2 30 02/10/2014 0516   GLUCOSE 84 09/04/2022 0824   GLUCOSE 76 02/10/2014 0516   BUN 14 09/04/2022 0824   BUN 5 (L) 02/10/2014 0516   CREATININE 0.90 09/04/2022 0824   CALCIUM 9.2 09/04/2022 0824   CALCIUM 7.9 (L) 02/10/2014 0516   GFRNONAA 69 11/18/2018 0000   GFRAA 80 11/18/2018 0000    Lipid Panel  Component Value Date/Time   CHOL 167 01/03/2022 0908   TRIG 86 01/03/2022 0908   HDL 52 01/03/2022 0908   CHOLHDL 3.2 01/03/2022 0908   VLDL 10.0 09/24/2017 1539   LDLCALC 97 01/03/2022 0908    CBC    Component Value Date/Time   WBC 7.1 09/04/2022 0824   RBC 4.64 09/04/2022 0824   HGB 12.9 09/04/2022 0824   HGB 8.4 (L) 02/10/2014 1750   HCT 40.2 09/04/2022 0824   HCT 25.0 (L) 02/10/2014 0516   PLT 245 09/04/2022 0824   PLT 125 (L) 02/10/2014 0516   MCV 86.6 09/04/2022 0824   MCV 87 02/10/2014 0516   MCH 27.8 09/04/2022 0824   MCHC 32.1 09/04/2022 0824   RDW 11.9 09/04/2022 0824   RDW 14.4 02/10/2014 0516   LYMPHSABS 2.2 10/21/2019 1019   LYMPHSABS 1.6 02/10/2014 0516   MONOABS 0.6 10/21/2019 1019   MONOABS 0.9 02/10/2014 0516   EOSABS 0.4 10/21/2019 1019   EOSABS 0.2 02/10/2014 0516   BASOSABS 0.0 10/21/2019 1019   BASOSABS 0.0 02/10/2014 0516    Hgb A1C Lab Results  Component Value Date   HGBA1C 5.4 01/03/2022           Assessment & Plan:   Assessment and Plan    Bilateral Upper Arm Pain Persistent pain in bilateral shoulders and upper arms, possibly related to muscle strain. Pain is worse with certain movements and when reaching. No tingling, weakness, or pain in other joints. Physical examination showed good range of motion and strength. -Order bilateral shoulder X-rays. -Prescribe Methocarbamol, a muscle relaxant, to be taken up to three times a day. -Consider referral to  orthopedist pending X-ray results.      RTC in 1 month for your annual exam Nicki Reaper, NP

## 2022-12-31 ENCOUNTER — Encounter: Payer: Self-pay | Admitting: Internal Medicine

## 2023-01-10 ENCOUNTER — Encounter: Payer: BC Managed Care – PPO | Admitting: Internal Medicine

## 2023-01-10 NOTE — Progress Notes (Deleted)
Subjective:    Patient ID: Alexis Suarez, female    DOB: 04/28/1975, 47 y.o.   MRN: [id number]  HPI  Patient presents to clinic today for her annual exam.  Flu: 01/2022 Tetanus: 01/2022 COVID: X 2 Pap smear: Hysterectomy Mammogram: 03/2022 Colon screening: Never Vision screening: Dentist:  Diet: Exercise:  Review of Systems     Past Medical History:  Diagnosis Date   Asthma    ED eval in the past   Sciatica     Current Outpatient Medications  Medication Sig Dispense Refill   methocarbamol (ROBAXIN) 500 MG tablet Take 1 tablet (500 mg total) by mouth every 8 (eight) hours as needed. 30 tablet 0   Phentermine-Topiramate 15-92 MG CP24 Take 1 capsule by mouth daily. 30 capsule 0   No current facility-administered medications for this visit.    No Known Allergies  Family History  Problem Relation Age of Onset   Thyroid disease Mother    Hypertension Sister    Miscarriages / [place] Sister    Stroke Sister    Diabetes Father    Prostate cancer Paternal Grandfather    Cancer Paternal Grandfather        Prostate    Social History   Socioeconomic History   Marital status: Single    Spouse name: Not on file   Number of children: 1   Years of education: Not on file   Highest education level: Bachelor's degree (e.g., BA, AB, BS)  Occupational History   Not on file  Tobacco Use   Smoking status: Never   Smokeless tobacco: Never  Vaping Use   Vaping status: Never Used  Substance and Sexual Activity   Alcohol use: Yes    Alcohol/week: 0.0 standard drinks of alcohol    Comment: Rare   Drug use: No   Sexual activity: Yes    Partners: Male  Other Topics Concern   Not on file  Social History Narrative   Lives in [place] with mother, son and boyfriend in a one story home. Works for a Teaching laboratory technician.      Education: college degree and 1 year of grad school.      Diet - regular diet            Social Determinants of Health   Financial  Resource Strain: Low Risk  (12/25/2022)   Overall Financial Resource Strain (CARDIA)    Difficulty of Paying Living Expenses: Not hard at all  Food Insecurity: No Food Insecurity (12/25/2022)   Hunger Vital Sign    Worried About Running Out of Food in the Last Year: Never true    Ran Out of Food in the Last Year: Never true  Transportation Needs: No Transportation Needs (12/25/2022)   PRAPARE - Administrator, Civil Service (Medical): No    Lack of Transportation (Non-Medical): No  Physical Activity: Insufficiently Active (12/25/2022)   Exercise Vital Sign    Days of Exercise per Week: 2 days    Minutes of Exercise per Session: 40 min  Stress: No Stress Concern Present (12/25/2022)   [employer] of Occupational Health - Occupational Stress Questionnaire    Feeling of Stress : Only a little  Social Connections: Moderately Integrated (12/25/2022)   Social Connection and Isolation Panel [NHANES]    Frequency of Communication with Friends and Family: More than three times a week    Frequency of Social Gatherings with Friends and Family: More than three times a week  Attends Religious Services: More than 4 times per year    Active Member of Clubs or Organizations: Yes    Attends Banker Meetings: More than 4 times per year    Marital Status: Never married  Intimate Partner Violence: Not At Risk (06/17/2018)   Humiliation, Afraid, Rape, and Kick questionnaire    Fear of Current or Ex-Partner: No    Emotionally Abused: No    Physically Abused: No    Sexually Abused: No     Constitutional: Denies fever, malaise, fatigue, headache or abrupt weight changes.  HEENT: Denies eye pain, eye redness, ear pain, ringing in the ears, wax buildup, runny nose, nasal congestion, bloody nose, or sore throat. Respiratory: Denies difficulty breathing, shortness of breath, cough or sputum production.   Cardiovascular: Denies chest pain, chest tightness, palpitations or  swelling in the hands or feet.  Gastrointestinal: Denies abdominal pain, bloating, constipation, diarrhea or blood in the stool.  GU: Denies urgency, frequency, pain with urination, burning sensation, blood in urine, odor or discharge. Musculoskeletal: Patient reports intermittent knee pain.  Denies decrease in range of motion, difficulty with gait, muscle pain or joint swelling.  Skin: Patient reports acne.  Denies redness, rashes, or ulcercations.  Neurological: Denies dizziness, difficulty with memory, difficulty with speech or problems with balance and coordination.  Psych: Denies anxiety, depression, SI/HI.  No other specific complaints in a complete review of systems (except as listed in HPI above).  Objective:   Physical Exam  LMP 04/09/2012  Wt Readings from Last 3 Encounters:  12/26/22 201 lb (91.2 kg)  11/07/22 195 lb (88.5 kg)  09/04/22 197 lb (89.4 kg)    General: Appears their stated age, well developed, well nourished in NAD. Skin: Warm, dry and intact. No rashes, lesions or ulcerations noted. HEENT: Head: normal shape and size; Eyes: sclera white, no icterus, conjunctiva pink, PERRLA and EOMs intact; Ears: Tm's gray and intact, normal light reflex; Nose: mucosa pink and moist, septum midline; Throat/Mouth: Teeth present, mucosa pink and moist, no exudate, lesions or ulcerations noted.  Neck:  Neck supple, trachea midline. No masses, lumps or thyromegaly present.  Cardiovascular: Normal rate and rhythm. S1,S2 noted.  No murmur, rubs or gallops noted. No JVD or BLE edema. No carotid bruits noted. Pulmonary/Chest: Normal effort and positive vesicular breath sounds. No respiratory distress. No wheezes, rales or ronchi noted.  Abdomen: Soft and nontender. Normal bowel sounds. No distention or masses noted. Liver, spleen and kidneys non palpable. Musculoskeletal: Normal range of motion. No signs of joint swelling. No difficulty with gait.  Neurological: Alert and oriented.  Cranial nerves II-XII grossly intact. Coordination normal.  Psychiatric: Mood and affect normal. Behavior is normal. Judgment and thought content normal.    BMET    Component Value Date/Time   NA 137 09/04/2022 0824   NA 139 02/10/2014 0516   K 4.1 09/04/2022 0824   K 3.8 02/10/2014 0516   CL 105 09/04/2022 0824   CL 103 02/10/2014 0516   CO2 24 09/04/2022 0824   CO2 30 02/10/2014 0516   GLUCOSE 84 09/04/2022 0824   GLUCOSE 76 02/10/2014 0516   BUN 14 09/04/2022 0824   BUN 5 (L) 02/10/2014 0516   CREATININE 0.90 09/04/2022 0824   CALCIUM 9.2 09/04/2022 0824   CALCIUM 7.9 (L) 02/10/2014 0516   GFRNONAA 69 11/18/2018 0000   GFRAA 80 11/18/2018 0000    Lipid Panel     Component Value Date/Time   CHOL 167 01/03/2022 0908  TRIG 86 01/03/2022 0908   HDL 52 01/03/2022 0908   CHOLHDL 3.2 01/03/2022 0908   VLDL 10.0 09/24/2017 1539   LDLCALC 97 01/03/2022 0908    CBC    Component Value Date/Time   WBC 7.1 09/04/2022 0824   RBC 4.64 09/04/2022 0824   HGB 12.9 09/04/2022 0824   HGB 8.4 (L) 02/10/2014 1750   HCT 40.2 09/04/2022 0824   HCT 25.0 (L) 02/10/2014 0516   PLT 245 09/04/2022 0824   PLT 125 (L) 02/10/2014 0516   MCV 86.6 09/04/2022 0824   MCV 87 02/10/2014 0516   MCH 27.8 09/04/2022 0824   MCHC 32.1 09/04/2022 0824   RDW 11.9 09/04/2022 0824   RDW 14.4 02/10/2014 0516   LYMPHSABS 2.2 10/21/2019 1019   LYMPHSABS 1.6 02/10/2014 0516   MONOABS 0.6 10/21/2019 1019   MONOABS 0.9 02/10/2014 0516   EOSABS 0.4 10/21/2019 1019   EOSABS 0.2 02/10/2014 0516   BASOSABS 0.0 10/21/2019 1019   BASOSABS 0.0 02/10/2014 0516    Hgb A1C Lab Results  Component Value Date   HGBA1C 5.4 01/03/2022            Assessment & Plan:   Preventative health maintenance:  Flu Tetanus UTD Encouraged her to get her COVID booster She no longer needs to screen for cervical cancer Mammogram ordered-she will call to schedule Referral to GI for screening  colonoscopy Encouraged her to consume a balanced diet and exercise regimen Advised her to see an eye doctor and dentist annually We will check CBC, c-Met, lipid, A1c today  RTC in 6 months, follow-up chronic conditions Nicki Reaper, NP

## 2023-02-07 ENCOUNTER — Encounter: Payer: Self-pay | Admitting: Internal Medicine

## 2023-02-07 ENCOUNTER — Ambulatory Visit (INDEPENDENT_AMBULATORY_CARE_PROVIDER_SITE_OTHER): Payer: BC Managed Care – PPO | Admitting: Internal Medicine

## 2023-02-07 VITALS — BP 110/64 | Ht 66.0 in | Wt 193.0 lb

## 2023-02-07 DIAGNOSIS — Z23 Encounter for immunization: Secondary | ICD-10-CM | POA: Diagnosis not present

## 2023-02-07 DIAGNOSIS — Z136 Encounter for screening for cardiovascular disorders: Secondary | ICD-10-CM

## 2023-02-07 DIAGNOSIS — Z0001 Encounter for general adult medical examination with abnormal findings: Secondary | ICD-10-CM | POA: Diagnosis not present

## 2023-02-07 DIAGNOSIS — E66811 Obesity, class 1: Secondary | ICD-10-CM

## 2023-02-07 DIAGNOSIS — Z1211 Encounter for screening for malignant neoplasm of colon: Secondary | ICD-10-CM

## 2023-02-07 DIAGNOSIS — R739 Hyperglycemia, unspecified: Secondary | ICD-10-CM | POA: Diagnosis not present

## 2023-02-07 DIAGNOSIS — E6609 Other obesity due to excess calories: Secondary | ICD-10-CM

## 2023-02-07 DIAGNOSIS — Z1231 Encounter for screening mammogram for malignant neoplasm of breast: Secondary | ICD-10-CM

## 2023-02-07 DIAGNOSIS — Z6831 Body mass index (BMI) 31.0-31.9, adult: Secondary | ICD-10-CM

## 2023-02-07 NOTE — Assessment & Plan Note (Signed)
She has a planned liposuction procedure coming up Encourage diet and exercise for weight loss

## 2023-02-07 NOTE — Progress Notes (Signed)
Subjective:    Patient ID: Alexis Suarez, female    DOB: 11-Aug-1975, 47 y.o.   MRN: [id number]  HPI  Patient presents to clinic today for her annual exam.    Flu: 01/2022 Tetanus: 01/2022 COVID: Pfizer x2 Pap smear: Hysterectomy Mammogram: 03/2022 Colon screening: Never Vision screening: annually Dentist: biannually  Diet: She does eat meat. She consumes fruits and veggies. She does eat some fried foods. She drinks mostly water or juice. Exercise: None  Review of Systems     Past Medical History:  Diagnosis Date   Asthma    ED eval in the past   Sciatica     Current Outpatient Medications  Medication Sig Dispense Refill   methocarbamol (ROBAXIN) 500 MG tablet Take 1 tablet (500 mg total) by mouth every 8 (eight) hours as needed. 30 tablet 0   Phentermine-Topiramate 15-92 MG CP24 Take 1 capsule by mouth daily. 30 capsule 0   No current facility-administered medications for this visit.    No Known Allergies  Family History  Problem Relation Age of Onset   Thyroid disease Mother    Hypertension Sister    Miscarriages / [place] Sister    Stroke Sister    Diabetes Father    Prostate cancer Paternal Grandfather    Cancer Paternal Grandfather        Prostate    Social History   Socioeconomic History   Marital status: Single    Spouse name: Not on file   Number of children: 1   Years of education: Not on file   Highest education level: Bachelor's degree (e.g., BA, AB, BS)  Occupational History   Not on file  Tobacco Use   Smoking status: Never   Smokeless tobacco: Never  Vaping Use   Vaping status: Never Used  Substance and Sexual Activity   Alcohol use: Yes    Alcohol/week: 0.0 standard drinks of alcohol    Comment: Rare   Drug use: No   Sexual activity: Yes    Partners: Male  Other Topics Concern   Not on file  Social History Narrative   Lives in [place] with mother, son and boyfriend in a one story home. Works for a Teaching laboratory technician.       Education: college degree and 1 year of grad school.      Diet - regular diet            Social Determinants of Health   Financial Resource Strain: Low Risk  (12/25/2022)   Overall Financial Resource Strain (CARDIA)    Difficulty of Paying Living Expenses: Not hard at all  Food Insecurity: No Food Insecurity (12/25/2022)   Hunger Vital Sign    Worried About Running Out of Food in the Last Year: Never true    Ran Out of Food in the Last Year: Never true  Transportation Needs: No Transportation Needs (12/25/2022)   PRAPARE - Administrator, Civil Service (Medical): No    Lack of Transportation (Non-Medical): No  Physical Activity: Insufficiently Active (12/25/2022)   Exercise Vital Sign    Days of Exercise per Week: 2 days    Minutes of Exercise per Session: 40 min  Stress: No Stress Concern Present (12/25/2022)   [employer] of Occupational Health - Occupational Stress Questionnaire    Feeling of Stress : Only a little  Social Connections: Moderately Integrated (12/25/2022)   Social Connection and Isolation Panel [NHANES]    Frequency of Communication with Friends and  Family: More than three times a week    Frequency of Social Gatherings with Friends and Family: More than three times a week    Attends Religious Services: More than 4 times per year    Active Member of [employer] or Organizations: Yes    Attends Banker Meetings: More than 4 times per year    Marital Status: Never married  Intimate Partner Violence: Not At Risk (06/17/2018)   Humiliation, Afraid, Rape, and Kick questionnaire    Fear of Current or Ex-Partner: No    Emotionally Abused: No    Physically Abused: No    Sexually Abused: No     Constitutional: Denies fever, malaise, fatigue, headache or abrupt weight changes.  HEENT: Denies eye pain, eye redness, ear pain, ringing in the ears, wax buildup, runny nose, nasal congestion, bloody nose, or sore throat. Respiratory: Denies  difficulty breathing, shortness of breath, cough or sputum production.   Cardiovascular: Denies chest pain, chest tightness, palpitations or swelling in the hands or feet.  Gastrointestinal: Denies abdominal pain, bloating, constipation, diarrhea or blood in the stool.  GU: Denies urgency, frequency, pain with urination, burning sensation, blood in urine, odor or discharge. Musculoskeletal: Patient reports chronic right knee pain.  Denies decrease in range of motion, difficulty with gait, muscle pain or joint swelling.  Skin: Denies redness, rashes, lesions or ulcercations.  Neurological: Denies dizziness, difficulty with memory, difficulty with speech or problems with balance and coordination.  Psych: Denies anxiety, depression, SI/HI.  No other specific complaints in a complete review of systems (except as listed in HPI above).  Objective:   Physical Exam  BP 110/64   Ht 5\' 6"  (1.676 m)   Wt 193 lb (87.5 kg)   LMP 04/09/2012   BMI 31.15 kg/m    Wt Readings from Last 3 Encounters:  12/26/22 201 lb (91.2 kg)  11/07/22 195 lb (88.5 kg)  09/04/22 197 lb (89.4 kg)    General: Appears her stated age, obese, in NAD. Skin: Warm, dry and intact.  Acne noted on face. HEENT: Head: normal shape and size; Eyes: sclera white, no icterus, conjunctiva pink, PERRLA and EOMs intact;  Neck:  Neck supple, trachea midline. No masses, lumps or thyromegaly present.  Cardiovascular: Normal rate and rhythm. S1,S2 noted.  No murmur, rubs or gallops noted. No JVD or BLE edema.  Pulmonary/Chest: Normal effort and positive vesicular breath sounds. No respiratory distress. No wheezes, rales or ronchi noted.  Abdomen: Normal bowel sounds.  Musculoskeletal: Strength 5/5 BUE/BLE.  No difficulty with gait.  Neurological: Alert and oriented. Cranial nerves II-XII grossly intact. Coordination normal.  Psychiatric: Mood and affect normal. Behavior is normal. Judgment and thought content normal.   BMET     Component Value Date/Time   NA 137 09/04/2022 0824   NA 139 02/10/2014 0516   K 4.1 09/04/2022 0824   K 3.8 02/10/2014 0516   CL 105 09/04/2022 0824   CL 103 02/10/2014 0516   CO2 24 09/04/2022 0824   CO2 30 02/10/2014 0516   GLUCOSE 84 09/04/2022 0824   GLUCOSE 76 02/10/2014 0516   BUN 14 09/04/2022 0824   BUN 5 (L) 02/10/2014 0516   CREATININE 0.90 09/04/2022 0824   CALCIUM 9.2 09/04/2022 0824   CALCIUM 7.9 (L) 02/10/2014 0516   GFRNONAA 69 11/18/2018 0000   GFRAA 80 11/18/2018 0000    Lipid Panel     Component Value Date/Time   CHOL 167 01/03/2022 0908   TRIG 86 01/03/2022  0908   HDL 52 01/03/2022 0908   CHOLHDL 3.2 01/03/2022 0908   VLDL 10.0 09/24/2017 1539   LDLCALC 97 01/03/2022 0908    CBC    Component Value Date/Time   WBC 7.1 09/04/2022 0824   RBC 4.64 09/04/2022 0824   HGB 12.9 09/04/2022 0824   HGB 8.4 (L) 02/10/2014 1750   HCT 40.2 09/04/2022 0824   HCT 25.0 (L) 02/10/2014 0516   PLT 245 09/04/2022 0824   PLT 125 (L) 02/10/2014 0516   MCV 86.6 09/04/2022 0824   MCV 87 02/10/2014 0516   MCH 27.8 09/04/2022 0824   MCHC 32.1 09/04/2022 0824   RDW 11.9 09/04/2022 0824   RDW 14.4 02/10/2014 0516   LYMPHSABS 2.2 10/21/2019 1019   LYMPHSABS 1.6 02/10/2014 0516   MONOABS 0.6 10/21/2019 1019   MONOABS 0.9 02/10/2014 0516   EOSABS 0.4 10/21/2019 1019   EOSABS 0.2 02/10/2014 0516   BASOSABS 0.0 10/21/2019 1019   BASOSABS 0.0 02/10/2014 0516    Hgb A1C Lab Results  Component Value Date   HGBA1C 5.4 01/03/2022           Assessment & Plan:   Preventative Health Maintenance:  Flu shot today Tdap UTD Encouraged her to get her COVID booster She no longer needs Pap smears Mammogram ordered-she will call to schedule Referral to GI for screening colonoscopy Encouraged her to consume a balanced diet and exercise regimen Advised her to see an eye doctor and dentist annually We will check CBC, c-Met, lipid, A1c today  RTC in 6 months,  follow-up chronic conditions Nicki Reaper, NP

## 2023-02-07 NOTE — Patient Instructions (Signed)

## 2023-02-08 LAB — LIPID PANEL
Cholesterol: 153 mg/dL (ref <200)
HDL: 47 mg/dL — ABNORMAL LOW (ref >=50)
LDL Cholesterol (Calc): 94 mg/dL
Non-HDL Cholesterol (Calc): 106 mg/dL (ref <130)
Total CHOL/HDL Ratio: 3.3 (calc) (ref <5.0)
Triglycerides: 41 mg/dL (ref <150)

## 2023-02-08 LAB — COMPLETE METABOLIC PANEL WITH GFR
AG Ratio: 1.3 (calc) (ref 1.0–2.5)
ALT: 9 U/L (ref 6–29)
AST: 10 U/L (ref 10–35)
Albumin: 3.9 g/dL (ref 3.6–5.1)
Alkaline phosphatase (APISO): 83 U/L (ref 31–125)
BUN: 16 mg/dL (ref 7–25)
CO2: 24 mmol/L (ref 20–32)
Calcium: 8.9 mg/dL (ref 8.6–10.2)
Chloride: 107 mmol/L (ref 98–110)
Creat: 0.91 mg/dL (ref 0.50–0.99)
Globulin: 2.9 g/dL (ref 1.9–3.7)
Glucose, Bld: 75 mg/dL (ref 65–139)
Potassium: 4.3 mmol/L (ref 3.5–5.3)
Sodium: 137 mmol/L (ref 135–146)
Total Bilirubin: 0.5 mg/dL (ref 0.2–1.2)
Total Protein: 6.8 g/dL (ref 6.1–8.1)
eGFR: 79 mL/min/{1.73_m2} (ref >=60)

## 2023-02-08 LAB — CBC
HCT: 37.7 % (ref 35.0–45.0)
Hemoglobin: 12.4 g/dL (ref 11.7–15.5)
MCH: 27.4 pg (ref 27.0–33.0)
MCHC: 32.9 g/dL (ref 32.0–36.0)
MCV: 83.4 fL (ref 80.0–100.0)
MPV: 11.4 fL (ref 7.5–12.5)
Platelets: 264 10*3/uL (ref 140–400)
RBC: 4.52 10*6/uL (ref 3.80–5.10)
RDW: 12 % (ref 11.0–15.0)
WBC: 6.9 10*3/uL (ref 3.8–10.8)

## 2023-02-08 LAB — HEMOGLOBIN A1C
Hgb A1c MFr Bld: 5.3 %{Hb} (ref <5.7)
Mean Plasma Glucose: 105 mg/dL
eAG (mmol/L): 5.8 mmol/L

## 2023-02-09 ENCOUNTER — Other Ambulatory Visit: Payer: Self-pay | Admitting: Internal Medicine

## 2023-02-10 ENCOUNTER — Encounter: Payer: Self-pay | Admitting: Internal Medicine

## 2023-02-11 ENCOUNTER — Telehealth: Payer: Self-pay

## 2023-02-11 ENCOUNTER — Other Ambulatory Visit: Payer: Self-pay

## 2023-02-11 DIAGNOSIS — Z1211 Encounter for screening for malignant neoplasm of colon: Secondary | ICD-10-CM

## 2023-02-11 MED ORDER — NA SULFATE-K SULFATE-MG SULF 17.5-3.13-1.6 GM/177ML PO SOLN: 1.0000 | Freq: Once | ORAL | 0 refills | Status: AC

## 2023-02-11 MED ORDER — PHENTERMINE-TOPIRAMATE ER 15-92 MG PO CP24: 1.0000 | ORAL_CAPSULE | Freq: Every day | ORAL | 0 refills | Status: DC

## 2023-02-11 MED ORDER — NORETHINDRONE 0.35 MG PO TABS
1.0000 | ORAL_TABLET | Freq: Every day | ORAL | 1 refills | Status: DC
Start: 2023-02-11 — End: 2023-06-10

## 2023-02-11 NOTE — Telephone Encounter (Signed)
Gastroenterology Pre-Procedure Review  Request Date: 03/18/22 Requesting Physician: Dr. Allegra Lai  PATIENT REVIEW QUESTIONS: The patient responded to the following health history questions as indicated:    1. Are you having any GI issues? no 2. Do you have a personal history of Polyps? no 3. Do you have a family history of Colon Cancer or Polyps? no 4. Diabetes Mellitus? no 5. Joint replacements in the past 12 months? Tummy tuck in August 2024 6. Major health problems in the past 3 months?no 7. Any artificial heart valves, MVP, or defibrillator?no    MEDICATIONS & ALLERGIES:    Patient reports the following regarding taking any anticoagulation/antiplatelet therapy:   Plavix, Coumadin, Eliquis, Xarelto, Lovenox, Pradaxa, Brilinta, or Effient? no Aspirin? no  Patient confirms/reports the following medications:  Current Outpatient Medications  Medication Sig Dispense Refill   norethindrone (ORTHO MICRONOR) 0.35 MG tablet Take 1 tablet (0.35 mg total) by mouth daily. 90 tablet 1   Phentermine-Topiramate 15-92 MG CP24 Take 1 capsule by mouth daily. 30 capsule 0   methocarbamol (ROBAXIN) 500 MG tablet Take 1 tablet (500 mg total) by mouth every 8 (eight) hours as needed. (Patient not taking: Reported on 02/11/2023) 30 tablet 0   No current facility-administered medications for this visit.    Patient confirms/reports the following allergies:  No Known Allergies  No orders of the defined types were placed in this encounter.   AUTHORIZATION INFORMATION Primary Insurance: 1D#: Group #:  Secondary Insurance: 1D#: Group #:  SCHEDULE INFORMATION: Date: 03/18/22 Time: Location: [facility]

## 2023-02-11 NOTE — Telephone Encounter (Signed)
Duplicate request- filled 02/11/23 Requested Prescriptions  Pending Prescriptions Disp Refills   norethindrone (MICRONOR) 0.35 MG tablet [Pharmacy Med Name: NORETHINDRONE 0.35MG  TABLETS 28S] 84 tablet 1    Sig: TAKE 1 TABLET(0.35 MG) BY MOUTH DAILY     OB/GYN: Contraceptives - Progestins Passed - 02/09/2023  9:54 PM      Passed - Last BP in normal range    BP Readings from Last 1 Encounters:  02/07/23 110/64         Passed - Valid encounter within last 12 months    Recent Outpatient Visits           4 days ago Encounter for general adult medical examination with abnormal findings   [place] [facility] [place], [place], NP   1 month ago Pain in both upper arms   [place] [facility] [place], Salv[place], NP   3 months ago Bilateral arm pain   [place] [facility] [place], Salvadore [place], NP   5 months ago Preoperative clearance   [place] [facility] [place], Salv[place], NP   10 months ago Stress and adjustment reaction   [place] [facility] [place], Salv[place], [place]              Passed - Patient is not a smoker

## 2023-02-12 ENCOUNTER — Telehealth: Payer: Self-pay

## 2023-02-12 NOTE — Telephone Encounter (Signed)
Insurance does not cover Qsymia. Do you want to prescribe an alternative or should I proceed with PA? Thanks!

## 2023-02-13 NOTE — Telephone Encounter (Signed)
Proceed with PA. She has been on and off this medication for years

## 2023-03-19 ENCOUNTER — Ambulatory Visit
Admission: RE | Admit: 2023-03-19 | Discharge: 2023-03-19 | Disposition: A | Discharge status: Home / Self Care | Payer: BC Managed Care – PPO | Attending: Gastroenterology | Admitting: Gastroenterology

## 2023-03-19 ENCOUNTER — Ambulatory Visit: Payer: BC Managed Care – PPO | Admitting: Certified Registered"

## 2023-03-19 ENCOUNTER — Encounter
Admission: RE | Disposition: A | Discharge status: Home / Self Care | Payer: Self-pay | Source: Home / Self Care | Attending: Gastroenterology

## 2023-03-19 DIAGNOSIS — Z1211 Encounter for screening for malignant neoplasm of colon: Secondary | ICD-10-CM | POA: Diagnosis not present

## 2023-03-19 HISTORY — PX: COLONOSCOPY WITH PROPOFOL: SHX5780

## 2023-03-19 SURGERY — COLONOSCOPY WITH PROPOFOL
Anesthesia: General

## 2023-03-19 MED ORDER — SODIUM CHLORIDE 0.9 % IV SOLN: INTRAVENOUS | Status: DC

## 2023-03-19 MED ORDER — PROPOFOL 10 MG/ML IV BOLUS
INTRAVENOUS | Status: DC | PRN
  Administered 2023-03-19: 90 mg via INTRAVENOUS

## 2023-03-19 MED ORDER — LIDOCAINE HCL (CARDIAC) PF 100 MG/5ML IV SOSY
PREFILLED_SYRINGE | INTRAVENOUS | Status: DC | PRN
  Administered 2023-03-19: 50 mg via INTRAVENOUS

## 2023-03-19 MED ORDER — PROPOFOL 500 MG/50ML IV EMUL
INTRAVENOUS | Status: DC | PRN
  Administered 2023-03-19: 150 ug/kg/min via INTRAVENOUS

## 2023-03-19 NOTE — Transfer of Care (Signed)
 Immediate Anesthesia Transfer of Care Note  Patient: Alexis Suarez  Procedure(s) Performed: COLONOSCOPY WITH PROPOFOL   Patient Location: PACU and Endoscopy Unit  Anesthesia Type:General  Level of Consciousness: awake  Airway & Oxygen Therapy: Patient Spontanous Breathing  Post-op Assessment: Report given to RN and Post -op Vital signs reviewed and stable  Post vital signs: Reviewed and stable  Last Vitals:  Vitals Value Taken Time  BP 98/64 03/19/23 1037  Temp 35.9 C 03/19/23 1036  Pulse 83 03/19/23 1037  Resp 18 03/19/23 1037  SpO2 100 % 03/19/23 1037  Vitals shown include unfiled device data.  Last Pain:  Vitals:   03/19/23 1036  TempSrc: Temporal  PainSc: 0-No pain         Complications: No notable events documented.

## 2023-03-19 NOTE — Anesthesia Preprocedure Evaluation (Signed)
 Anesthesia Evaluation  Patient identified by MRN, date of birth, ID band Patient awake    Reviewed: Allergy & Precautions, NPO status , Patient's Chart, lab work & pertinent test results  Airway Mallampati: III  TM Distance: >3 FB Neck ROM: full    Dental no notable dental hx.    Pulmonary neg pulmonary ROS   Pulmonary exam normal        Cardiovascular negative cardio ROS Normal cardiovascular exam     Neuro/Psych negative neurological ROS  negative psych ROS   GI/Hepatic negative GI ROS, Neg liver ROS,,,  Endo/Other  negative endocrine ROS    Renal/GU negative Renal ROS  negative genitourinary   Musculoskeletal   Abdominal   Peds  Hematology negative hematology ROS (+)   Anesthesia Other Findings Past Medical History: No date: Asthma     Comment:  ED eval in the past No date: Sciatica  Past Surgical History: 2015: ABDOMINAL HYSTERECTOMY     Comment:  partial 2000: BREAST SURGERY     Comment:  breast reduction 2012: CESAREAN SECTION No date: DILATION AND CURETTAGE OF UTERUS 01/10/2019: LIPOSUCTION 2000: REDUCTION MAMMAPLASTY; Bilateral No date: TONSILECTOMY/ADENOIDECTOMY WITH MYRINGOTOMY  BMI 31   Reproductive/Obstetrics negative OB ROS                             Anesthesia Physical Anesthesia Plan  ASA: 2  Anesthesia Plan: General   Post-op Pain Management: Minimal or no pain anticipated   Induction: Intravenous  PONV Risk Score and Plan: 2 and Propofol  infusion and TIVA  Airway Management Planned: Natural Airway and Nasal Cannula  Additional Equipment:   Intra-op Plan:   Post-operative Plan:   Informed Consent: I have reviewed the patients History and Physical, chart, labs and discussed the procedure including the risks, benefits and alternatives for the proposed anesthesia with the patient or authorized representative who has indicated his/her understanding  and acceptance.     Dental Advisory Given  Plan Discussed with: Anesthesiologist, CRNA and Surgeon  Anesthesia Plan Comments: (Patient consented for risks of anesthesia including but not limited to:  - adverse reactions to medications - risk of airway placement if required - damage to eyes, teeth, lips or other oral mucosa - nerve damage due to positioning  - sore throat or hoarseness - Damage to heart, brain, nerves, lungs, other parts of body or loss of life  Patient voiced understanding and assent.)       Anesthesia Quick Evaluation

## 2023-03-19 NOTE — Anesthesia Postprocedure Evaluation (Signed)
 Anesthesia Post Note  Patient: Alexis Suarez  Procedure(s) Performed: COLONOSCOPY WITH PROPOFOL   Patient location during evaluation: Endoscopy Anesthesia Type: General Level of consciousness: awake and alert Pain management: pain level controlled Vital Signs Assessment: post-procedure vital signs reviewed and stable Respiratory status: spontaneous breathing, nonlabored ventilation, respiratory function stable and patient connected to nasal cannula oxygen Cardiovascular status: blood pressure returned to baseline and stable Postop Assessment: no apparent nausea or vomiting Anesthetic complications: no   No notable events documented.   Last Vitals:  Vitals:   03/19/23 1052 03/19/23 1057  BP: 107/74 (!) 105/95  Pulse: 66 63  Resp: 16 16  Temp:    SpO2: 100% 100%    Last Pain:  Vitals:   03/19/23 1052  TempSrc:   PainSc: 0-No pain                 Nancey Awkward

## 2023-03-19 NOTE — Op Note (Signed)
 [facility] Gastroenterology Patient Name: Alexis Suarez Procedure Date: 03/19/2023 10:07 AM MRN: [id number] Account #: [id number] Date of Birth: 02-27-1976 Admit Type: Outpatient Age: 48 Room: [facility] ENDO ROOM 1 Gender: Female Note Status: Finalized Instrument Name: Charlyn Cooley [id number] Procedure:             Colonoscopy Indications:           Screening for colorectal malignant neoplasm, This is                         the patient's first colonoscopy Providers:             Selena Daily MD, MD Referring MD:          Carollynn Cirri (Referring MD) Medicines:             General Anesthesia Complications:         No immediate complications. Estimated blood loss: None. Procedure:             Pre-Anesthesia Assessment:                        - Prior to the procedure, a History and Physical was                         performed, and patient medications and allergies were                         reviewed. The patient is competent. The risks and                         benefits of the procedure and the sedation options and                         risks were discussed with the patient. All questions                         were answered and informed consent was obtained.                         Patient identification and proposed procedure were                         verified by the physician, the nurse, the                         anesthesiologist, the anesthetist and the technician                         in the pre-procedure area in the procedure room in the                         endoscopy suite. Mental Status Examination: alert and                         oriented. Airway Examination: normal oropharyngeal                         airway and neck mobility. Respiratory Examination:  clear to auscultation. CV Examination: normal.                         Prophylactic Antibiotics: The patient does not require                         prophylactic  antibiotics. Prior Anticoagulants: The                         patient has taken no anticoagulant or antiplatelet                         agents. ASA Grade Assessment: II - A patient with mild                         systemic disease. After reviewing the risks and                         benefits, the patient was deemed in satisfactory                         condition to undergo the procedure. The anesthesia                         plan was to use general anesthesia. Immediately prior                         to administration of medications, the patient was                         re-assessed for adequacy to receive sedatives. The                         heart rate, respiratory rate, oxygen saturations,                         blood pressure, adequacy of pulmonary ventilation, and                         response to care were monitored throughout the                         procedure. The physical status of the patient was                         re-assessed after the procedure.                        After obtaining informed consent, the colonoscope was                         passed under direct vision. Throughout the procedure,                         the patient's blood pressure, pulse, and oxygen                         saturations were monitored continuously. The  Colonoscope was introduced through the anus and                         advanced to the the cecum, identified by appendiceal                         orifice and ileocecal valve. The colonoscopy was                         performed without difficulty. The patient tolerated                         the procedure well. The quality of the bowel                         preparation was evaluated using the BBPS [facility] Bowel                         Preparation Scale) with scores of: Right Colon = 3,                         Transverse Colon = 3 and Left Colon = 3 (entire mucosa                         seen  well with no residual staining, small fragments                         of stool or opaque liquid). The total BBPS score                         equals 9. The ileocecal valve, appendiceal orifice,                         and rectum were photographed. Findings:      The perianal and digital rectal examinations were normal. Pertinent       negatives include normal sphincter tone and no palpable rectal lesions.      The entire examined colon appeared normal.      The retroflexed view of the distal rectum and anal verge was normal and       showed no anal or rectal abnormalities. Impression:            - The entire examined colon is normal.                        - The distal rectum and anal verge are normal on                         retroflexion view.                        - No specimens collected. Recommendation:        - Discharge patient to home (with escort).                        - Resume previous diet today.                        -  Continue present medications.                        - Repeat colonoscopy in 10 years for screening                         purposes. Procedure Code(s):     --- Professional ---                        W0981, Colorectal cancer screening; colonoscopy on                         individual not meeting criteria for high risk Diagnosis Code(s):     --- Professional ---                        Z12.11, Encounter for screening for malignant neoplasm                         of colon CPT copyright 2022 American Medical Association. All rights reserved. The codes documented in this report are preliminary and upon coder review may  be revised to meet current compliance requirements. Dr. Evia Hof Selena Daily MD, MD 03/19/2023 10:33:42 AM This report has been signed electronically. Number of Addenda: 0 Note Initiated On: 03/19/2023 10:07 AM Scope Withdrawal Time: 0 hours 6 minutes 13 seconds  Total Procedure Duration: 0 hours 9 minutes 45 seconds   Estimated Blood Loss:  Estimated blood loss: none.      [facility]

## 2023-03-19 NOTE — H&P (Signed)
 Alexis Oz, MD [address]  [address]  [place], [place] [address]  Main: [phone]  Fax: [phone] Pager: [phone]  Primary Care Physician:  Carollynn Cirri, NP Primary Gastroenterologist:  Dr. Karma Suarez  Pre-Procedure History & Physical: HPI:  Alexis Suarez is a 48 y.o. female is here for an colonoscopy.   Past Medical History:  Diagnosis Date   Asthma    ED eval in the past   Sciatica     Past Surgical History:  Procedure Laterality Date   ABDOMINAL HYSTERECTOMY  2015   partial   BREAST SURGERY  2000   breast reduction   CESAREAN SECTION  2012   DILATION AND CURETTAGE OF UTERUS     LIPOSUCTION  01/10/2019   REDUCTION MAMMAPLASTY Bilateral 2000   TONSILECTOMY/ADENOIDECTOMY WITH MYRINGOTOMY      Prior to Admission medications   Medication Sig Start Date End Date Taking? Authorizing Provider  methocarbamol  (ROBAXIN ) 500 MG tablet Take 1 tablet (500 mg total) by mouth every 8 (eight) hours as needed. Patient not taking: Reported on 02/11/2023 12/26/22   Carollynn Cirri, NP  norethindrone  (ORTHO MICRONOR ) 0.35 MG tablet Take 1 tablet (0.35 mg total) by mouth daily. 02/11/23   Carollynn Cirri, NP  Phentermine -Topiramate  15-92 MG CP24 Take 1 capsule by mouth daily. 02/11/23   Carollynn Cirri, NP    Allergies as of 02/11/2023   (No Known Allergies)    Family History  Problem Relation Age of Onset   Thyroid  disease Mother    Hypertension Sister    Miscarriages / [place] Sister    Stroke Sister    Diabetes Father    Prostate cancer Paternal Grandfather    Cancer Paternal Grandfather        Prostate    Social History   Socioeconomic History   Marital status: Single    Spouse name: Not on file   Number of children: 1   Years of education: Not on file   Highest education level: Bachelor's degree (e.g., BA, AB, BS)  Occupational History   Not on file  Tobacco Use   Smoking status: Never   Smokeless tobacco: Never  Vaping Use    Vaping status: Never Used  Substance and Sexual Activity   Alcohol use: Yes    Alcohol/week: 0.0 standard drinks of alcohol    Comment: Rare   Drug use: No   Sexual activity: Yes    Partners: Male  Other Topics Concern   Not on file  Social History Narrative   Lives in [place] with mother, son and boyfriend in a one story home. Works for a Teaching laboratory technician.      Education: college degree and 1 year of grad school.      Diet - regular diet            Social Drivers of Health   Financial Resource Strain: Low Risk  (12/25/2022)   Overall Financial Resource Strain (CARDIA)    Difficulty of Paying Living Expenses: Not hard at all  Food Insecurity: No Food Insecurity (02/07/2023)   Hunger Vital Sign    Worried About Running Out of Food in the Last Year: Never true    Ran Out of Food in the Last Year: Never true  Transportation Needs: No Transportation Needs (02/07/2023)   PRAPARE - Administrator, Civil Service (Medical): No    Lack of Transportation (Non-Medical): No  Physical Activity: Insufficiently Active (02/07/2023)  Exercise Vital Sign    Days of Exercise per Week: 2 days    Minutes of Exercise per Session: 30 min  Stress: No Stress Concern Present (12/25/2022)   [employer] of Occupational Health - Occupational Stress Questionnaire    Feeling of Stress : Only a little  Social Connections: Moderately Integrated (12/25/2022)   Social Connection and Isolation Panel [NHANES]    Frequency of Communication with Friends and Family: More than three times a week    Frequency of Social Gatherings with Friends and Family: More than three times a week    Attends Religious Services: More than 4 times per year    Active Member of [employer] or Organizations: Yes    Attends Banker Meetings: More than 4 times per year    Marital Status: Never married  Intimate Partner Violence: Not At Risk (02/07/2023)   Humiliation, Afraid, Rape, and Kick questionnaire     Fear of Current or Ex-Partner: No    Emotionally Abused: No    Physically Abused: No    Sexually Abused: No    Review of Systems: See HPI, otherwise negative ROS  Physical Exam: BP 110/65   Pulse 62   Temp (!) 97.2 F (36.2 C) (Temporal)   Resp 16   Ht 5\' 8"  (1.727 m)   Wt 87.1 kg   LMP 04/09/2012   SpO2 100%   BMI 29.19 kg/m  General:   Alert,  pleasant and cooperative in NAD Head:  Normocephalic and atraumatic. Neck:  Supple; no masses or thyromegaly. Lungs:  Clear throughout to auscultation.    Heart:  Regular rate and rhythm. Abdomen:  Soft, nontender and nondistended. Normal bowel sounds, without guarding, and without rebound.   Neurologic:  Alert and  oriented x4;  grossly normal neurologically.  Impression/Plan: [name] is here for an colonoscopy to be performed for colon cancer screening  Risks, benefits, limitations, and alternatives regarding  colonoscopy have been reviewed with the patient.  Questions have been answered.  All parties agreeable.   Ellis Guys, MD  03/19/2023, 10:13 AM

## 2023-03-19 NOTE — Anesthesia Procedure Notes (Signed)
 Procedure Name: MAC Date/Time: 03/19/2023 10:19 AM  Performed by: Donnamae Gaba, CRNAPre-anesthesia Checklist: Patient identified, Emergency Drugs available, Suction available, Patient being monitored and Timeout performed Patient Re-evaluated:Patient Re-evaluated prior to induction Oxygen Delivery Method: Nasal cannula Induction Type: IV induction Placement Confirmation: positive ETCO2 and CO2 detector

## 2023-03-20 ENCOUNTER — Encounter: Payer: Self-pay | Admitting: Gastroenterology

## 2023-04-01 ENCOUNTER — Ambulatory Visit: Payer: BC Managed Care – PPO

## 2023-04-07 ENCOUNTER — Ambulatory Visit
Admission: RE | Admit: 2023-04-07 | Discharge: 2023-04-07 | Disposition: A | Discharge status: Home / Self Care | Payer: BC Managed Care – PPO | Source: Ambulatory Visit | Attending: Internal Medicine | Admitting: Internal Medicine

## 2023-04-07 DIAGNOSIS — Z1231 Encounter for screening mammogram for malignant neoplasm of breast: Secondary | ICD-10-CM

## 2023-04-26 ENCOUNTER — Other Ambulatory Visit: Payer: Self-pay | Admitting: Internal Medicine

## 2023-04-29 NOTE — Telephone Encounter (Signed)
 Requested medications are due for refill today.  yes  Requested medications are on the active medications list.  yes  Last refill. 02/11/2023 #30 0 rf  Future visit scheduled.   no  Notes to clinic.  Refill not delegated.    Requested Prescriptions  Pending Prescriptions Disp Refills   QSYMIA 15-92 MG CP24 [Pharmacy Med Name: QSYMIA 15-92MG  CAPSULES] 30 capsule     Sig: TAKE 1 CAPSULE BY MOUTH DAILY     Not Delegated - Neurology: Anticonvulsants - Controlled - phentermine / topiramate Failed - 04/29/2023  7:47 AM      Failed - This refill cannot be delegated      Failed - Last BP in normal range    BP Readings from Last 1 Encounters:  03/19/23 (!) 105/95         Passed - Cr in normal range and within 360 days    Creat  Date Value Ref Range Status  02/07/2023 0.91 0.50 - 0.99 mg/dL Final         Passed - CO2 in normal range and within 360 days    CO2  Date Value Ref Range Status  02/07/2023 24 20 - 32 mmol/L Final   Co2  Date Value Ref Range Status  02/10/2014 30 21 - 32 mmol/L Final         Passed - ALT in normal range and within 360 days    ALT  Date Value Ref Range Status  02/07/2023 9 6 - 29 U/L Final   SGPT (ALT)  Date Value Ref Range Status  02/09/2014 18 U/L Final    Comment:    14-63 NOTE: New Reference Range 09/21/13          Passed - AST in normal range and within 360 days    AST  Date Value Ref Range Status  02/07/2023 10 10 - 35 U/L Final   SGOT(AST)  Date Value Ref Range Status  02/09/2014 32 15 - 37 Unit/L Final         Passed - Glucose (serum) in normal range and within 360 days    Glucose  Date Value Ref Range Status  02/10/2014 76 65 - 99 mg/dL Final   Glucose, Bld  Date Value Ref Range Status  02/07/2023 75 65 - 139 mg/dL Final    Comment:    .        Non-fasting reference interval .          Passed - K in normal range and within 360 days    Potassium  Date Value Ref Range Status  02/07/2023 4.3 3.5 - 5.3 mmol/L  Final  02/10/2014 3.8 3.5 - 5.1 mmol/L Final         Passed - Completed PHQ-2 or PHQ-9 in the last 360 days      Passed - Patient is not pregnant      Passed - Last Heart Rate in normal range    Pulse Readings from Last 1 Encounters:  03/19/23 63         Passed - Valid encounter within last 6 months    Recent Outpatient Visits           2 months ago Encounter for general adult medical examination with abnormal findings   [facility] [place], [place] W, NP   4 months ago Pain in both upper arms   [place] [facility] [place], Salv[place]ore [place], NP   5 months ago  Bilateral arm pain   [facility] [place], Salvadore Oxford, NP   7 months ago Preoperative clearance   [place] [place], Salvadore Oxford, NP   1 year ago Stress and adjustment reaction   [place] [facility] [place], Salvadore [place], [place]

## 2023-05-01 ENCOUNTER — Encounter: Payer: Self-pay | Admitting: Internal Medicine

## 2023-05-01 NOTE — Telephone Encounter (Signed)
 Okay to release the note to her MyChart that says she is unable to fly for medical reasons for the next year

## 2023-05-14 DIAGNOSIS — M2391 Unspecified internal derangement of right knee: Secondary | ICD-10-CM | POA: Diagnosis not present

## 2023-05-14 DIAGNOSIS — M25561 Pain in right knee: Secondary | ICD-10-CM | POA: Diagnosis not present

## 2023-06-10 ENCOUNTER — Encounter: Payer: Self-pay | Admitting: Internal Medicine

## 2023-06-10 ENCOUNTER — Other Ambulatory Visit (HOSPITAL_COMMUNITY)
Admission: RE | Admit: 2023-06-10 | Discharge: 2023-06-10 | Disposition: A | Discharge status: Home / Self Care | Source: Ambulatory Visit | Attending: Internal Medicine | Admitting: Internal Medicine

## 2023-06-10 ENCOUNTER — Ambulatory Visit: Admitting: Internal Medicine

## 2023-06-10 VITALS — BP 92/64 | Ht 68.0 in | Wt 199.8 lb

## 2023-06-10 DIAGNOSIS — Z113 Encounter for screening for infections with a predominantly sexual mode of transmission: Secondary | ICD-10-CM | POA: Insufficient documentation

## 2023-06-10 NOTE — Patient Instructions (Signed)
 How to Have Safe Sex Having safe sex means taking steps before and during sex to reduce your risk of: Getting a sexually transmitted infection (STI). Giving your partner an STI. Unwanted or unplanned pregnancy. How to have safe sex Ways you can have safe sex  Limit your sex partners to only one partner who is only having sex with you. Avoid using alcohol and drugs before having sex. Alcohol and drugs can affect your judgment. Before having sex with a new partner: Talk to your partner about past partners, past STIs, and drug use. Get screened for STIs and discuss the results with your partner. Ask your partner to get screened too. Check your body regularly for sores, blisters, rashes, or unusual discharge. If you notice any of these things, call your health care provider. Avoid sexual contact if you have symptoms of an infection or you're being treated for an STI. While having sex, use a condom. Make sure to: Use a condom every time you have vaginal, oral, or anal sex. Both females and males should wear condoms during oral sex. Do not use a female condom and a female condom at the same time during vaginal sex. Using both types at the same time can cause condoms to break. Keep condoms in place from the beginning to the end of sexual activity. Use a latex condom, if possible. Latex condoms offer the best protection. Use only water-based lubricants with a condom. Using petroleum-based lubricants or oils will weaken the condom and increase the chance that it will break. Ways your health care provider can help you have safe sex  See your provider for regular screenings, exams, and tests for STIs. Talk with your provider about what kind of birth control is best for you. Get vaccinated against hepatitis B and human papillomavirus (HPV). If you're at risk of getting human immunodeficiency virus (HIV), talk with your provider about taking a medicine to prevent HIV. You're at risk for HIV if you: Are  a female who has sex with other males. Are sexually active with more than one partner. Take drugs by injection. Have a sex partner who has HIV. Have unprotected sex. Have sex with someone who has sex with both males and females. Follow these instructions at home: Take your medicines only as told. Call your provider if you think you might be pregnant. Call your provider if have any symptoms of an infection. Where to find more information Centers for Disease Control and Prevention (CDC): TonerPromos.no Office on Women's Health: TravelLesson.ca This information is not intended to replace advice given to you by your health care provider. Make sure you discuss any questions you have with your health care provider. Document Revised: 07/10/2022 Document Reviewed: 07/10/2022 Elsevier Patient Education  2024 [employer].

## 2023-06-10 NOTE — Progress Notes (Signed)
 Subjective:    Patient ID: Alexis Suarez, female    DOB: Nov 27, 1975, 48 y.o.   MRN: [id number]  HPI  Discussed the use of AI scribe software for clinical note transcription with the patient, who gave verbal consent to proceed.   Alexis Suarez is a 48 year old female who presents for STD screening.  She seeks testing for gonorrhea, chlamydia, and trichomoniasis, with no current symptoms related to STDs. She mentions having had recent screenings for HIV, syphilis, and hepatitis, but these tests have not been conducted at this facility since 2021.     Review of Systems   Past Medical History:  Diagnosis Date   Asthma    ED eval in the past   Sciatica     Current Outpatient Medications  Medication Sig Dispense Refill   norethindrone (ORTHO MICRONOR) 0.35 MG tablet Take 1 tablet (0.35 mg total) by mouth daily. 90 tablet 1   Phentermine-Topiramate (QSYMIA) 15-92 MG CP24 TAKE 1 CAPSULE BY MOUTH DAILY 30 capsule 0   No current facility-administered medications for this visit.    No Known Allergies  Family History  Problem Relation Age of Onset   Thyroid disease Mother    Hypertension Sister    Miscarriages / [place] Sister    Stroke Sister    Diabetes Father    Prostate cancer Paternal Grandfather    Cancer Paternal Grandfather        Prostate    Social History   Socioeconomic History   Marital status: Single    Spouse name: Not on file   Number of children: 1   Years of education: Not on file   Highest education level: Bachelor's degree (e.g., BA, AB, BS)  Occupational History   Not on file  Tobacco Use   Smoking status: Never   Smokeless tobacco: Never  Vaping Use   Vaping status: Never Used  Substance and Sexual Activity   Alcohol use: Yes    Alcohol/week: 0.0 standard drinks of alcohol    Comment: Rare   Drug use: No   Sexual activity: Yes    Partners: Male  Other Topics Concern   Not on file  Social History Narrative   Lives in [place] with  mother, son and boyfriend in a one story home. Works for a Teaching laboratory technician.      Education: college degree and 1 year of grad school.      Diet - regular diet            Social Drivers of Health   Financial Resource Strain: Low Risk  (12/25/2022)   Overall Financial Resource Strain (CARDIA)    Difficulty of Paying Living Expenses: Not hard at all  Food Insecurity: No Food Insecurity (02/07/2023)   Hunger Vital Sign    Worried About Running Out of Food in the Last Year: Never true    Ran Out of Food in the Last Year: Never true  Transportation Needs: No Transportation Needs (02/07/2023)   PRAPARE - Administrator, Civil Service (Medical): No    Lack of Transportation (Non-Medical): No  Physical Activity: Insufficiently Active (02/07/2023)   Exercise Vital Sign    Days of Exercise per Week: 2 days    Minutes of Exercise per Session: 30 min  Stress: No Stress Concern Present (12/25/2022)   [employer] of Occupational Health - Occupational Stress Questionnaire    Feeling of Stress : Only a little  Social Connections: Moderately Integrated (12/25/2022)  Social Advertising account executive [NHANES]    Frequency of Communication with Friends and Family: More than three times a week    Frequency of Social Gatherings with Friends and Family: More than three times a week    Attends Religious Services: More than 4 times per year    Active Member of [employer] or Organizations: Yes    Attends Engineer, structural: More than 4 times per year    Marital Status: Never married  Intimate Partner Violence: Not At Risk (02/07/2023)   Humiliation, Afraid, Rape, and Kick questionnaire    Fear of Current or Ex-Partner: No    Emotionally Abused: No    Physically Abused: No    Sexually Abused: No     Constitutional: Denies fever, malaise, fatigue, headache or abrupt weight changes.  Respiratory: Denies difficulty breathing, shortness of breath, cough or sputum  production.   Cardiovascular: Denies chest pain, chest tightness, palpitations or swelling in the hands or feet.  Gastrointestinal: Denies abdominal pain, bloating, constipation, diarrhea or blood in the stool.  GU: Denies urgency, frequency, pain with urination, burning sensation, blood in urine, odor or discharge.  No other specific complaints in a complete review of systems (except as listed in HPI above).      Objective:   Physical Exam  BP 92/64 (BP Location: Left Arm, Patient Position: Sitting, Cuff Size: Normal)   Ht 5\' 8"  (1.727 m)   Wt 199 lb 12.8 oz (90.6 kg)   LMP 04/09/2012   BMI 30.38 kg/m   Wt Readings from Last 3 Encounters:  03/19/23 192 lb (87.1 kg)  02/07/23 193 lb (87.5 kg)  12/26/22 201 lb (91.2 kg)    General: Appears her stated age, obese in NAD. Cardiovascular: Normal rate. Pulmonary/Chest: Normal effort. Pelvic: Self swab.   Neurological: Alert and oriented.    BMET    Component Value Date/Time   NA 137 02/07/2023 1438   NA 139 02/10/2014 0516   K 4.3 02/07/2023 1438   K 3.8 02/10/2014 0516   CL 107 02/07/2023 1438   CL 103 02/10/2014 0516   CO2 24 02/07/2023 1438   CO2 30 02/10/2014 0516   GLUCOSE 75 02/07/2023 1438   GLUCOSE 76 02/10/2014 0516   BUN 16 02/07/2023 1438   BUN 5 (L) 02/10/2014 0516   CREATININE 0.91 02/07/2023 1438   CALCIUM 8.9 02/07/2023 1438   CALCIUM 7.9 (L) 02/10/2014 0516   GFRNONAA 69 11/18/2018 0000   GFRAA 80 11/18/2018 0000    Lipid Panel     Component Value Date/Time   CHOL 153 02/07/2023 1438   TRIG 41 02/07/2023 1438   HDL 47 (L) 02/07/2023 1438   CHOLHDL 3.3 02/07/2023 1438   VLDL 10.0 09/24/2017 1539   LDLCALC 94 02/07/2023 1438    CBC    Component Value Date/Time   WBC 6.9 02/07/2023 1438   RBC 4.52 02/07/2023 1438   HGB 12.4 02/07/2023 1438   HGB 8.4 (L) 02/10/2014 1750   HCT 37.7 02/07/2023 1438   HCT 25.0 (L) 02/10/2014 0516   PLT 264 02/07/2023 1438   PLT 125 (L) 02/10/2014 0516    MCV 83.4 02/07/2023 1438   MCV 87 02/10/2014 0516   MCH 27.4 02/07/2023 1438   MCHC 32.9 02/07/2023 1438   RDW 12.0 02/07/2023 1438   RDW 14.4 02/10/2014 0516   LYMPHSABS 2.2 10/21/2019 1019   LYMPHSABS 1.6 02/10/2014 0516   MONOABS 0.6 10/21/2019 1019   MONOABS 0.9 02/10/2014 0516  EOSABS 0.4 10/21/2019 1019   EOSABS 0.2 02/10/2014 0516   BASOSABS 0.0 10/21/2019 1019   BASOSABS 0.0 02/10/2014 0516    Hgb A1C Lab Results  Component Value Date   HGBA1C 5.3 02/07/2023            Assessment & Plan:  Assessment and Plan    Sexually Transmitted Infection Screening She requested STI screening for gonorrhea, chlamydia, and trichomoniasis.  - Order STI screening for gonorrhea, chlamydia, and trichomoniasis. - Will check HIV, syphilis and RPR     RTC in 8 months for your annual exam Nicki Reaper, NP

## 2023-06-11 LAB — HEPATITIS C ANTIBODY: Hepatitis C Ab: NONREACTIVE

## 2023-06-11 LAB — RPR: RPR Ser Ql: NONREACTIVE

## 2023-06-11 LAB — HIV ANTIBODY (ROUTINE TESTING W REFLEX): HIV 1&2 Ab, 4th Generation: NONREACTIVE

## 2023-06-12 ENCOUNTER — Encounter: Payer: Self-pay | Admitting: Internal Medicine

## 2023-06-12 LAB — CERVICOVAGINAL ANCILLARY ONLY
Chlamydia: NEGATIVE
Comment: NEGATIVE
Comment: NEGATIVE
Comment: NORMAL
Neisseria Gonorrhea: NEGATIVE
Trichomonas: NEGATIVE

## 2023-07-09 ENCOUNTER — Encounter: Payer: Self-pay | Admitting: Internal Medicine

## 2023-07-09 DIAGNOSIS — L281 Prurigo nodularis: Secondary | ICD-10-CM | POA: Diagnosis not present

## 2023-07-09 DIAGNOSIS — L811 Chloasma: Secondary | ICD-10-CM | POA: Diagnosis not present

## 2023-07-09 DIAGNOSIS — L700 Acne vulgaris: Secondary | ICD-10-CM | POA: Diagnosis not present

## 2023-07-16 ENCOUNTER — Other Ambulatory Visit: Payer: Self-pay | Admitting: Medical Genetics

## 2023-07-18 ENCOUNTER — Other Ambulatory Visit: Payer: Self-pay

## 2023-07-22 ENCOUNTER — Encounter: Payer: Self-pay | Admitting: Internal Medicine

## 2023-07-22 ENCOUNTER — Ambulatory Visit: Admitting: Internal Medicine

## 2023-07-22 VITALS — BP 102/68 | HR 78 | Ht 68.0 in | Wt 197.8 lb

## 2023-07-22 DIAGNOSIS — N62 Hypertrophy of breast: Secondary | ICD-10-CM | POA: Diagnosis not present

## 2023-07-22 DIAGNOSIS — Z01818 Encounter for other preprocedural examination: Secondary | ICD-10-CM

## 2023-07-22 NOTE — Progress Notes (Signed)
 Subjective:    Patient ID: Alexis Suarez, female    DOB: 1976-01-03, 48 y.o.   MRN: [id number]  HPI  Discussed the use of AI scribe software for clinical note transcription with the patient, who gave verbal consent to proceed.   Alexis Suarez is a 48 year old female who presents for pre-operative evaluation for breast reduction and lift surgery.  She is scheduled for breast reduction and lift surgery on July 11th. She has previously undergone a tummy tuck and liposuction, which were successful. Pre-operative blood work, including a CBC and PT/PTT, is required as her last blood work was done in January and will be six months old by July. She has already completed a mammogram and sent the results to the surgical team.  She wants to maintain her weight at 191 pounds, noting a recent increase to 197 pounds, and is actively working to return to her desired weight.       Review of Systems   Past Medical History:  Diagnosis Date   Asthma    ED eval in the past   Sciatica     No current outpatient medications on file.   No current facility-administered medications for this visit.    No Known Allergies  Family History  Problem Relation Age of Onset   Thyroid  disease Mother    Hypertension Sister    Miscarriages / [place] Sister    Stroke Sister    Diabetes Father    Prostate cancer Paternal Grandfather    Cancer Paternal Grandfather        Prostate    Social History   Socioeconomic History   Marital status: Single    Spouse name: Not on file   Number of children: 1   Years of education: Not on file   Highest education level: Bachelor's degree (e.g., BA, AB, BS)  Occupational History   Not on file  Tobacco Use   Smoking status: Never   Smokeless tobacco: Never  Vaping Use   Vaping status: Never Used  Substance and Sexual Activity   Alcohol use: Yes    Alcohol/week: 0.0 standard drinks of alcohol    Comment: Rare   Drug use: No   Sexual activity: Yes     Partners: Male  Other Topics Concern   Not on file  Social History Narrative   Lives in [place] with mother, son and boyfriend in a one story home. Works for a Teaching laboratory technician.      Education: college degree and 1 year of grad school.      Diet - regular diet            Social Drivers of Health   Financial Resource Strain: Low Risk  (12/25/2022)   Overall Financial Resource Strain (CARDIA)    Difficulty of Paying Living Expenses: Not hard at all  Food Insecurity: No Food Insecurity (02/07/2023)   Hunger Vital Sign    Worried About Running Out of Food in the Last Year: Never true    Ran Out of Food in the Last Year: Never true  Transportation Needs: No Transportation Needs (02/07/2023)   PRAPARE - Administrator, Civil Service (Medical): No    Lack of Transportation (Non-Medical): No  Physical Activity: Insufficiently Active (02/07/2023)   Exercise Vital Sign    Days of Exercise per Week: 2 days    Minutes of Exercise per Session: 30 min  Stress: No Stress Concern Present (12/25/2022)   [employer]  of Occupational Health - Occupational Stress Questionnaire    Feeling of Stress : Only a little  Social Connections: Moderately Integrated (12/25/2022)   Social Connection and Isolation Panel [NHANES]    Frequency of Communication with Friends and Family: More than three times a week    Frequency of Social Gatherings with Friends and Family: More than three times a week    Attends Religious Services: More than 4 times per year    Active Member of [employer] or Organizations: Yes    Attends Banker Meetings: More than 4 times per year    Marital Status: Never married  Intimate Partner Violence: Not At Risk (02/07/2023)   Humiliation, Afraid, Rape, and Kick questionnaire    Fear of Current or Ex-Partner: No    Emotionally Abused: No    Physically Abused: No    Sexually Abused: No     Constitutional: Denies fever, malaise, fatigue, headache or abrupt  weight changes.  HEENT: Denies eye pain, eye redness, ear pain, ringing in the ears, wax buildup, runny nose, nasal congestion, bloody nose, or sore throat. Respiratory: Denies difficulty breathing, shortness of breath, cough or sputum production.   Cardiovascular: Denies chest pain, chest tightness, palpitations or swelling in the hands or feet.  Gastrointestinal: Denies abdominal pain, bloating, constipation, diarrhea or blood in the stool.  GU: Denies urgency, frequency, pain with urination, burning sensation, blood in urine, odor or discharge. Musculoskeletal: Denies decrease in range of motion, difficulty with gait, muscle pain or joint pain and swelling.  Skin: Pt reports large breasts. Denies redness, rashes, lesions or ulcercations.  Neurological: Denies dizziness, difficulty with memory, difficulty with speech or problems with balance and coordination.  Psych: Denies anxiety, depression, SI/HI.  No other specific complaints in a complete review of systems (except as listed in HPI above).      Objective:   Physical Exam  BP 102/68 (BP Location: Right Arm, Patient Position: Sitting, Cuff Size: Normal)   Pulse 78   Ht 5\' 8"  (1.727 m)   Wt 197 lb 12.8 oz (89.7 kg)   LMP 04/09/2012   BMI 30.08 kg/m   Wt Readings from Last 3 Encounters:  06/10/23 199 lb 12.8 oz (90.6 kg)  03/19/23 192 lb (87.1 kg)  02/07/23 193 lb (87.5 kg)    General: Appears her stated age, obese, in NAD. Skin: Warm, dry and intact. No rashes, lesions or ulcerations noted. HEENT: Head: normal shape and size; Eyes: sclera white, no icterus, conjunctiva pink, PERRLA and EOMs intact;  Neck:  Neck supple, trachea midline. No masses, lumps or thyromegaly present.  Cardiovascular: Normal rate and rhythm. S1,S2 noted.  No murmur, rubs or gallops noted. No JVD or BLE edema.  Pulmonary/Chest: Normal effort and positive vesicular breath sounds. No respiratory distress. No wheezes, rales or ronchi noted.   Musculoskeletal:  No difficulty with gait.  Neurological: Alert and oriented. Coordination normal.  Psychiatric: Mood and affect normal. Behavior is normal. Judgment and thought content normal.    BMET    Component Value Date/Time   NA 137 02/07/2023 1438   NA 139 02/10/2014 0516   K 4.3 02/07/2023 1438   K 3.8 02/10/2014 0516   CL 107 02/07/2023 1438   CL 103 02/10/2014 0516   CO2 24 02/07/2023 1438   CO2 30 02/10/2014 0516   GLUCOSE 75 02/07/2023 1438   GLUCOSE 76 02/10/2014 0516   BUN 16 02/07/2023 1438   BUN 5 (L) 02/10/2014 0516   CREATININE 0.91 02/07/2023  1438   CALCIUM 8.9 02/07/2023 1438   CALCIUM 7.9 (L) 02/10/2014 0516   GFRNONAA 69 11/18/2018 0000   GFRAA 80 11/18/2018 0000    Lipid Panel     Component Value Date/Time   CHOL 153 02/07/2023 1438   TRIG 41 02/07/2023 1438   HDL 47 (L) 02/07/2023 1438   CHOLHDL 3.3 02/07/2023 1438   VLDL 10.0 09/24/2017 1539   LDLCALC 94 02/07/2023 1438    CBC    Component Value Date/Time   WBC 6.9 02/07/2023 1438   RBC 4.52 02/07/2023 1438   HGB 12.4 02/07/2023 1438   HGB 8.4 (L) 02/10/2014 1750   HCT 37.7 02/07/2023 1438   HCT 25.0 (L) 02/10/2014 0516   PLT 264 02/07/2023 1438   PLT 125 (L) 02/10/2014 0516   MCV 83.4 02/07/2023 1438   MCV 87 02/10/2014 0516   MCH 27.4 02/07/2023 1438   MCHC 32.9 02/07/2023 1438   RDW 12.0 02/07/2023 1438   RDW 14.4 02/10/2014 0516   LYMPHSABS 2.2 10/21/2019 1019   LYMPHSABS 1.6 02/10/2014 0516   MONOABS 0.6 10/21/2019 1019   MONOABS 0.9 02/10/2014 0516   EOSABS 0.4 10/21/2019 1019   EOSABS 0.2 02/10/2014 0516   BASOSABS 0.0 10/21/2019 1019   BASOSABS 0.0 02/10/2014 0516    Hgb A1C Lab Results  Component Value Date   HGBA1C 5.3 02/07/2023            Assessment & Plan:   Assessment and Plan    Preoperative clearance for breast reduction and lift planned Surgery scheduled for July 11th. Pre-operative blood work required. Mammogram completed and sent to  surgical team. Previous cosmetic surgeries with satisfactory outcomes. Motivated to maintain weight at 191 pounds. - Order CBC with differential, CMET and PT, PTT. - Complete pre-operative form for surgery.       RTC in 7 months for your annual exam Helayne Lo, NP

## 2023-07-23 ENCOUNTER — Ambulatory Visit: Payer: Self-pay | Admitting: Internal Medicine

## 2023-07-23 LAB — CBC WITH DIFFERENTIAL/PLATELET
Absolute Lymphocytes: 2301 {cells}/uL (ref 850–3900)
Absolute Monocytes: 601 {cells}/uL (ref 200–950)
Basophils Absolute: 39 {cells}/uL (ref 0–200)
Basophils Relative: 0.5 %
Eosinophils Absolute: 476 {cells}/uL (ref 15–500)
Eosinophils Relative: 6.1 %
HCT: 35.6 % (ref 35.0–45.0)
Hemoglobin: 11.6 g/dL — ABNORMAL LOW (ref 11.7–15.5)
MCH: 27.6 pg (ref 27.0–33.0)
MCHC: 32.6 g/dL (ref 32.0–36.0)
MCV: 84.8 fL (ref 80.0–100.0)
MPV: 10.6 fL (ref 7.5–12.5)
Monocytes Relative: 7.7 %
Neutro Abs: 4384 {cells}/uL (ref 1500–7800)
Neutrophils Relative %: 56.2 %
Platelets: 246 10*3/uL (ref 140–400)
RBC: 4.2 10*6/uL (ref 3.80–5.10)
RDW: 12 % (ref 11.0–15.0)
Total Lymphocyte: 29.5 %
WBC: 7.8 10*3/uL (ref 3.8–10.8)

## 2023-07-23 LAB — COMPREHENSIVE METABOLIC PANEL WITH GFR
AG Ratio: 1.4 (calc) (ref 1.0–2.5)
ALT: 13 U/L (ref 6–29)
AST: 13 U/L (ref 10–35)
Albumin: 3.8 g/dL (ref 3.6–5.1)
Alkaline phosphatase (APISO): 110 U/L (ref 31–125)
BUN: 14 mg/dL (ref 7–25)
CO2: 25 mmol/L (ref 20–32)
Calcium: 8.9 mg/dL (ref 8.6–10.2)
Chloride: 107 mmol/L (ref 98–110)
Creat: 0.84 mg/dL (ref 0.50–0.99)
Globulin: 2.8 g/dL (ref 1.9–3.7)
Glucose, Bld: 93 mg/dL (ref 65–139)
Potassium: 4 mmol/L (ref 3.5–5.3)
Sodium: 139 mmol/L (ref 135–146)
Total Bilirubin: 0.4 mg/dL (ref 0.2–1.2)
Total Protein: 6.6 g/dL (ref 6.1–8.1)
eGFR: 86 mL/min/{1.73_m2} (ref >=60)

## 2023-07-23 LAB — APTT: aPTT: 26 s (ref 23–32)

## 2023-07-23 LAB — PROTIME-INR
INR: 0.9
Prothrombin Time: 10.2 s (ref 9.0–11.5)

## 2023-08-08 ENCOUNTER — Encounter: Payer: Self-pay | Admitting: Internal Medicine

## 2023-08-08 ENCOUNTER — Other Ambulatory Visit: Payer: Self-pay | Admitting: Internal Medicine

## 2023-08-11 NOTE — Telephone Encounter (Signed)
 Unable to refill per protocol, Rx expired. Discontinued 06/10/23.  Requested Prescriptions  Pending Prescriptions Disp Refills   Phentermine -Topiramate  (QSYMIA ) 15-92 MG CP24 [Pharmacy Med Name: PHENTERMIN/TOPIRAMATE  15-92MG  ER CP] 30 capsule     Sig: TAKE 1 CAPSULE BY MOUTH DAILY     Not Delegated - Neurology: Anticonvulsants - Controlled - phentermine  / topiramate  Failed - 08/11/2023  1:22 PM      Failed - This refill cannot be delegated      Failed - Valid encounter within last 6 months    Recent Outpatient Visits           2 weeks ago Preoperative clearance   [facility] [place], [place] W, NP   2 months ago Screen for STD (sexually transmitted disease)   [facility] [place], [place] W, NP              Passed - Cr in normal range and within 360 days    Creat  Date Value Ref Range Status  07/22/2023 0.84 0.50 - 0.99 mg/dL Final         Passed - CO2 in normal range and within 360 days    CO2  Date Value Ref Range Status  07/22/2023 25 20 - 32 mmol/L Final   Co2  Date Value Ref Range Status  02/10/2014 30 21 - 32 mmol/L Final         Passed - ALT in normal range and within 360 days    ALT  Date Value Ref Range Status  07/22/2023 13 6 - 29 U/L Final   SGPT (ALT)  Date Value Ref Range Status  02/09/2014 18 U/L Final    Comment:    14-63 NOTE: New Reference Range 09/21/13          Passed - AST in normal range and within 360 days    AST  Date Value Ref Range Status  07/22/2023 13 10 - 35 U/L Final   SGOT(AST)  Date Value Ref Range Status  02/09/2014 32 15 - 37 Unit/L Final         Passed - Glucose (serum) in normal range and within 360 days    Glucose  Date Value Ref Range Status  02/10/2014 76 65 - 99 mg/dL Final   Glucose, Bld  Date Value Ref Range Status  07/22/2023 93 65 - 139 mg/dL Final    Comment:    .        Non-fasting reference interval .          Passed - K in normal range and within  360 days    Potassium  Date Value Ref Range Status  07/22/2023 4.0 3.5 - 5.3 mmol/L Final  02/10/2014 3.8 3.5 - 5.1 mmol/L Final         Passed - Completed PHQ-2 or PHQ-9 in the last 360 days      Passed - Patient is not pregnant      Passed - Last BP in normal range    BP Readings from Last 1 Encounters:  07/22/23 102/68         Passed - Last Heart Rate in normal range    Pulse Readings from Last 1 Encounters:  07/22/23 78

## 2023-08-14 ENCOUNTER — Encounter: Payer: Self-pay | Admitting: Podiatry

## 2023-08-14 ENCOUNTER — Ambulatory Visit (INDEPENDENT_AMBULATORY_CARE_PROVIDER_SITE_OTHER): Payer: Self-pay | Admitting: Podiatry

## 2023-08-14 DIAGNOSIS — B351 Tinea unguium: Secondary | ICD-10-CM

## 2023-08-14 MED ORDER — PHENTERMINE-TOPIRAMATE ER 11.25-69 MG PO CP24: 1.0000 | ORAL_CAPSULE | Freq: Every day | ORAL | 0 refills | Status: DC

## 2023-08-14 NOTE — Addendum Note (Signed)
 Addended by: Carollynn Cirri on: 08/14/2023 01:34 PM   Modules accepted: Orders

## 2023-08-15 NOTE — Progress Notes (Signed)
 Subjective:   Patient ID: Alexis Suarez, female   DOB: 48 y.o.   MRN: [id number]   HPI Patient presents concerned about discoloration of nailbeds especially the right big toenail which is partially detached with history of trauma 4 years ago.  Patient does not smoke is active   Review of Systems  All other systems reviewed and are negative.       Objective:  Physical Exam Vitals and nursing note reviewed.  Constitutional:      Appearance: She is well-developed.  Pulmonary:     Effort: Pulmonary effort is normal.   Musculoskeletal:        General: Normal range of motion.   Skin:    General: Skin is warm.   Neurological:     Mental Status: She is alert.     Neurovascular status intact muscle strength adequate range of motion adequate traumatized right hallux nail bed partially detached with other nails that have a natural thickness to him with family history     Assessment:  Probability that contusion with damage to the nail plate is present right versus fungus with other nails just natural effect with possible fungus      Plan:  H&P reviewed and explained.  At this point I went ahead and I do not recommend oral antifungal or laser I did discuss nail removal but I do not think it will grow back normally and if we do it it is continued to be permanent if it becomes painful or completely deformed and I discussed with her surgery and recovery if necessary

## 2023-11-30 ENCOUNTER — Encounter: Payer: Self-pay | Admitting: Internal Medicine

## 2023-12-01 ENCOUNTER — Other Ambulatory Visit: Payer: Self-pay | Admitting: Internal Medicine

## 2023-12-01 MED ORDER — PHENTERMINE-TOPIRAMATE ER 15-92 MG PO CP24: 1.0000 | ORAL_CAPSULE | Freq: Every day | ORAL | 0 refills | Status: AC

## 2023-12-03 NOTE — Telephone Encounter (Signed)
 Requested medication (s) are due for refill today: no  Requested medication (s) are on the active medication list: yes  Last refill:  12/01/23 #30  Future visit scheduled: no  Notes to clinic:  med not delegated to NT to RF   Requested Prescriptions  Pending Prescriptions Disp Refills   Phentermine -Topiramate  ER 15-92 MG CP24 [Pharmacy Med Name: PHENTERMIN/TOPIRAMATE  15-92MG  ER CP] 30 capsule     Sig: Take 1 capsule by mouth daily.     Not Delegated - Neurology: Anticonvulsants - Controlled - phentermine  / topiramate  Failed - 12/03/2023 11:43 AM      Failed - This refill cannot be delegated      Passed - Cr in normal range and within 360 days    Creat  Date Value Ref Range Status  07/22/2023 0.84 0.50 - 0.99 mg/dL Final         Passed - CO2 in normal range and within 360 days    CO2  Date Value Ref Range Status  07/22/2023 25 20 - 32 mmol/L Final   Co2  Date Value Ref Range Status  02/10/2014 30 21 - 32 mmol/L Final         Passed - ALT in normal range and within 360 days    ALT  Date Value Ref Range Status  07/22/2023 13 6 - 29 U/L Final   SGPT (ALT)  Date Value Ref Range Status  02/09/2014 18 U/L Final    Comment:    14-63 NOTE: New Reference Range 09/21/13          Passed - AST in normal range and within 360 days    AST  Date Value Ref Range Status  07/22/2023 13 10 - 35 U/L Final   SGOT(AST)  Date Value Ref Range Status  02/09/2014 32 15 - 37 Unit/L Final         Passed - Glucose (serum) in normal range and within 360 days    Glucose  Date Value Ref Range Status  02/10/2014 76 65 - 99 mg/dL Final   Glucose, Bld  Date Value Ref Range Status  07/22/2023 93 65 - 139 mg/dL Final    Comment:    .        Non-fasting reference interval .          Passed - K in normal range and within 360 days    Potassium  Date Value Ref Range Status  07/22/2023 4.0 3.5 - 5.3 mmol/L Final  02/10/2014 3.8 3.5 - 5.1 mmol/L Final         Passed - Completed  PHQ-2 or PHQ-9 in the last 360 days      Passed - Patient is not pregnant      Passed - Last BP in normal range    BP Readings from Last 1 Encounters:  07/22/23 102/68         Passed - Last Heart Rate in normal range    Pulse Readings from Last 1 Encounters:  07/22/23 78         Passed - Valid encounter within last 6 months    Recent Outpatient Visits           4 months ago Preoperative clearance   [facility] [place], Angeline ORN, NP   5 months ago Screen for STD (sexually transmitted disease)   [facility] [place], Angeline ORN, NP

## 2023-12-25 ENCOUNTER — Other Ambulatory Visit: Payer: Self-pay | Admitting: Medical Genetics

## 2023-12-25 DIAGNOSIS — Z006 Encounter for examination for normal comparison and control in clinical research program: Secondary | ICD-10-CM

## 2024-02-19 ENCOUNTER — Ambulatory Visit
Admission: EM | Admit: 2024-02-19 | Discharge: 2024-02-19 | Disposition: A | Discharge status: Home / Self Care | Source: Home / Self Care

## 2024-02-19 DIAGNOSIS — J069 Acute upper respiratory infection, unspecified: Secondary | ICD-10-CM

## 2024-02-19 LAB — POC COVID19/FLU A&B COMBO
Covid Antigen, POC: NEGATIVE
Influenza A Antigen, POC: NEGATIVE
Influenza B Antigen, POC: NEGATIVE

## 2024-02-19 LAB — POCT RAPID STREP A (OFFICE): Rapid Strep A Screen: NEGATIVE

## 2024-02-19 NOTE — Discharge Instructions (Addendum)
 The strep, COVID and flu tests are negative.   Take Tylenol or ibuprofen as needed for fever or discomfort.  Take plain Mucinex as needed for congestion.  Rest and keep yourself hydrated.    Follow-up with your primary care provider if your symptoms are not improving.

## 2024-02-19 NOTE — ED Provider Notes (Signed)
 Alexis Suarez    CSN: [id number] Arrival date & time: 02/19/24  9044      History   Chief Complaint Chief Complaint  Patient presents with   Sore Throat    HPI Alexis Suarez is a 48 y.o. female.  Patient presents with sore throat and cough since last night.  No fever, wheezing, shortness of breath, vomiting, diarrhea.  She has been treating her symptoms with Mucinex.  Patient denies history of asthma.  The history is provided by the patient and medical records.    Past Medical History:  Diagnosis Date   Asthma    ED eval in the past   Sciatica     Patient Active Problem List   Diagnosis Date Noted   Class 1 obesity due to excess calories with body mass index (BMI) of 31.0 to 31.9 in adult 10/12/2021   Acne 10/12/2021    Past Surgical History:  Procedure Laterality Date   ABDOMINAL HYSTERECTOMY  2015   partial   BREAST SURGERY  2000   breast reduction   CESAREAN SECTION  2012   COLONOSCOPY WITH PROPOFOL  N/A 03/19/2023   Procedure: COLONOSCOPY WITH PROPOFOL ;  Surgeon: Unk Corinn Skiff, MD;  Location: [facility] ENDOSCOPY;  Service: Gastroenterology;  Laterality: N/A;   DILATION AND CURETTAGE OF UTERUS     LIPOSUCTION  01/10/2019   REDUCTION MAMMAPLASTY Bilateral 2000   TONSILECTOMY/ADENOIDECTOMY WITH MYRINGOTOMY      OB History   No obstetric history on file.      Home Medications    Prior to Admission medications  Medication Sig Start Date End Date Taking? Authorizing Provider  Phentermine -Topiramate  ER 15-92 MG CP24 Take 1 capsule by mouth daily. 12/01/23   Antonette Angeline ORN, NP    Family History Family History  Problem Relation Age of Onset   Thyroid  disease Mother    Hypertension Sister    Miscarriages / Stillbirths Sister    Stroke Sister    Diabetes Father    Prostate cancer Paternal Grandfather    Cancer Paternal Grandfather        Prostate    Social History Social History[1]   Allergies   Patient has no known  allergies.   Review of Systems Review of Systems  Constitutional:  Negative for chills and fever.  HENT:  Positive for sore throat. Negative for ear pain.   Respiratory:  Positive for cough. Negative for shortness of breath and wheezing.   Gastrointestinal:  Negative for diarrhea and vomiting.     Physical Exam Triage Vital Signs ED Triage Vitals  Encounter Vitals Group     BP 02/19/24 1014 100/66     Girls Systolic BP Percentile --      Girls Diastolic BP Percentile --      Boys Systolic BP Percentile --      Boys Diastolic BP Percentile --      Pulse Rate 02/19/24 1014 99     Resp 02/19/24 1014 18     Temp 02/19/24 1014 98.5 F (36.9 C)     Temp src --      SpO2 02/19/24 1014 97 %     Weight --      Height --      Head Circumference --      Peak Flow --      Pain Score 02/19/24 1021 8     Pain Loc --      Pain Education --      Exclude from [employer]  Chart --    No data found.  Updated Vital Signs BP 100/66   Pulse 99   Temp 98.5 F (36.9 C)   Resp 18   LMP 04/09/2012   SpO2 97%   Visual Acuity Right Eye Distance:   Left Eye Distance:   Bilateral Distance:    Right Eye Near:   Left Eye Near:    Bilateral Near:     Physical Exam Constitutional:      General: She is not in acute distress. HENT:     Right Ear: Tympanic membrane normal.     Left Ear: Tympanic membrane normal.     Nose: Nose normal.     Mouth/Throat:     Mouth: Mucous membranes are moist.     Pharynx: Oropharynx is clear.  Cardiovascular:     Rate and Rhythm: Normal rate and regular rhythm.     Heart sounds: Normal heart sounds.  Pulmonary:     Effort: Pulmonary effort is normal. No respiratory distress.     Breath sounds: Normal breath sounds. No wheezing.  Neurological:     Mental Status: She is alert.      UC Treatments / Results  Labs (all labs ordered are listed, but only abnormal results are displayed) Labs Reviewed  POC COVID19/FLU A&B COMBO  POCT RAPID STREP A  (OFFICE)    EKG   Radiology No results found.  Procedures Procedures (including critical care time)  Medications Ordered in UC Medications - No data to display  Initial Impression / Assessment and Plan / UC Course  I have reviewed the triage vital signs and the nursing notes.  Pertinent labs & imaging results that were available during my care of the patient were reviewed by me and considered in my medical decision making (see chart for details).    Viral URI.  Afebrile and vital signs are stable.  Lungs are clear and O2 sat is 97% on room air.  Rapid strep, COVID and flu negative.  Discussed symptomatic treatment including Tylenol  or ibuprofen as needed for fever or discomfort, plain Mucinex as needed for congestion, rest, hydration.  Instructed patient to follow-up with her PCP if she is not improving.  ED precautions given.  Patient agrees to plan of care.    Final Clinical Impressions(s) / UC Diagnoses   Final diagnoses:  Viral URI     Discharge Instructions      The strep, COVID and flu tests are negative.   Take Tylenol  or ibuprofen as needed for fever or discomfort.  Take plain Mucinex as needed for congestion.  Rest and keep yourself hydrated.    Follow-up with your primary care provider if your symptoms are not improving.         ED Prescriptions   None    PDMP not reviewed this encounter.    [1]  Social History Tobacco Use   Smoking status: Never   Smokeless tobacco: Never  Vaping Use   Vaping status: Never Used  Substance Use Topics   Alcohol use: Yes    Alcohol/week: 0.0 standard drinks of alcohol    Comment: Rare   Drug use: No     Corlis Burnard DEL, NP 02/19/24 1044

## 2024-02-19 NOTE — ED Triage Notes (Signed)
 Patient to Urgent Care with complaints of  productive cough (tan color)/ sore throat. No fevers.  Symptoms started last night.  Meds: mucinex last night and this morning.

## 2024-02-20 ENCOUNTER — Ambulatory Visit: Admitting: Student

## 2024-02-20 VITALS — BP 134/76 | HR 74 | Temp 100.3°F | Ht 68.0 in

## 2024-02-20 DIAGNOSIS — J209 Acute bronchitis, unspecified: Secondary | ICD-10-CM

## 2024-02-20 MED ORDER — PROMETHAZINE-DM 6.25-15 MG/5ML PO SYRP: 5.0000 mL | ORAL_SOLUTION | Freq: Four times a day (QID) | ORAL | 0 refills | Status: AC | PRN

## 2024-02-20 NOTE — Progress Notes (Signed)
 "  Established Patient Office Visit  Subjective   Patient ID: Alexis Suarez, female    DOB: Jul 22, 1975  Age: 48 y.o. MRN: [id number]  Chief Complaint  Patient presents with   Cough    Strep covid flu neg, 4-5 days, getting worse, chills, sob    Alexis Suarez is a 48 y.o. person with medical hx listed below who presents today for cough that started 2 days ago.Went to UC yesterday, covid, flu, and strep negative. Feeling worse this morning. Coughing up dark brown sputum. Also has runny nose. Unable to sleep due to cough. Taking halls and mucinex. Took ibuprofen with mild relief. Denies fever, chills, dyspnea, chest pain, n/v/d, dizziness.  Patient Active Problem List   Diagnosis Date Noted   Class 1 obesity due to excess calories with body mass index (BMI) of 31.0 to 31.9 in adult 10/12/2021   Acne 10/12/2021   ROS Refer to HPI    Objective:     Outpatient Encounter Medications as of 02/20/2024  Medication Sig   promethazine -dextromethorphan (PROMETHAZINE -DM) 6.25-15 MG/5ML syrup Take 5 mLs by mouth 4 (four) times daily as needed for up to 9 days for cough.   Phentermine -Topiramate  ER 15-92 MG CP24 Take 1 capsule by mouth daily.   No facility-administered encounter medications on file as of 02/20/2024.    BP 134/76   Pulse 74   Temp 100.3 F (37.9 C)   Ht 5' 8 (1.727 m)   LMP 04/09/2012   SpO2 98%   BMI 30.08 kg/m  BP Readings from Last 3 Encounters:  02/20/24 134/76  02/19/24 100/66  07/22/23 102/68    Physical Exam Constitutional:      Appearance: Normal appearance.  HENT:     Right Ear: Tympanic membrane normal.     Left Ear: Tympanic membrane normal.     Nose: Congestion present.     Mouth/Throat:     Mouth: Mucous membranes are moist.     Pharynx: Oropharynx is clear. No oropharyngeal exudate or posterior oropharyngeal erythema.  Cardiovascular:     Rate and Rhythm: Normal rate and regular rhythm.     Heart sounds: No murmur heard. Pulmonary:      Effort: Pulmonary effort is normal. No respiratory distress.     Breath sounds: Normal breath sounds. No stridor. No rhonchi or rales.     Comments: Coughing intermittently during exam Abdominal:     General: Abdomen is flat. Bowel sounds are normal. There is no distension.     Palpations: Abdomen is soft.     Tenderness: There is no abdominal tenderness.  Musculoskeletal:        General: Normal range of motion.     Right lower leg: No edema.     Left lower leg: No edema.  Lymphadenopathy:     Cervical: No cervical adenopathy.  Skin:    General: Skin is warm and dry.     Capillary Refill: Capillary refill takes less than 2 seconds.  Neurological:     General: No focal deficit present.     Mental Status: She is alert and oriented to person, place, and time.  Psychiatric:        Mood and Affect: Mood normal.        Behavior: Behavior normal.        02/20/2024    2:01 PM 07/22/2023    2:10 PM 06/10/2023    2:45 PM  Depression screen PHQ 2/9  Decreased Interest 0 0 0  Down,  Depressed, Hopeless 0 0 0  PHQ - 2 Score 0 0 0  Altered sleeping  0 0  Tired, decreased energy  0 0  Change in appetite  0 0  Feeling bad or failure about yourself   0 0  Trouble concentrating  0 0  Moving slowly or fidgety/restless  0 0  Suicidal thoughts  0 0  PHQ-9 Score  0  0   Difficult doing work/chores  Not difficult at all Not difficult at all     Data saved with a previous flowsheet row definition       02/20/2024    2:01 PM 07/22/2023    2:10 PM 06/10/2023    2:45 PM 02/07/2023    2:30 PM  GAD 7 : Generalized Anxiety Score  Nervous, Anxious, on Edge 0 0 0 0  Control/stop worrying 0 0 0 0  Worry too much - different things 0 0 0 0  Trouble relaxing 0 0 0 0  Restless 0 0 0 0  Easily annoyed or irritable 0 0 0 0  Afraid - awful might happen 0 0 0 0  Total GAD 7 Score 0 0 0 0  Anxiety Difficulty Not difficult at all Not difficult at all Not difficult at all    Assessment & Plan:  Acute  bronchitis, unspecified organism Likely viral, no crackles with stable vitals on exam. Normal lung exam. Low suspicion for developing pneumonia. Promethazine  Dm for cough at night, tylenol  as needed for body aches. Encouraged rest and PO hydration. Return precautions reviewed.  Other orders -     Promethazine -DM; Take 5 mLs by mouth 4 (four) times daily as needed for up to 9 days for cough.  Dispense: 118 mL; Refill: 0   Return if symptoms worsen or fail to improve.    Harlene Saddler, MD "

## 2024-02-20 NOTE — Progress Notes (Deleted)
 "   Date:  02/20/2024   Name:  Alexis Suarez   DOB:  [birth year]   MRN:  [id number]   Chief Complaint: No chief complaint on file.  HPI    Medication list has been reviewed and updated.  Active Medications[1]   Review of Systems  Patient Active Problem List   Diagnosis Date Noted   Class 1 obesity due to excess calories with body mass index (BMI) of 31.0 to 31.9 in adult 10/12/2021   Acne 10/12/2021    Allergies[2]  Immunization History  Administered Date(s) Administered   Influenza, Seasonal, Injecte, Preservative Fre 02/07/2023   Influenza,inj,Quad PF,6+ Mos 01/07/2014, 03/18/2016, 01/16/2017, 01/28/2018, 12/19/2020, 01/03/2022   PFIZER(Purple Top)SARS-COV-2 Vaccination 05/23/2019, 06/04/2019   Tdap 04/30/2010, 01/03/2022    Past Surgical History:  Procedure Laterality Date   ABDOMINAL HYSTERECTOMY  2015   partial   BREAST SURGERY  2000   breast reduction   CESAREAN SECTION  2012   COLONOSCOPY WITH PROPOFOL  N/A 03/19/2023   Procedure: COLONOSCOPY WITH PROPOFOL ;  Surgeon: Unk Corinn Skiff, MD;  Location: [facility] ENDOSCOPY;  Service: Gastroenterology;  Laterality: N/A;   DILATION AND CURETTAGE OF UTERUS     LIPOSUCTION  01/10/2019   REDUCTION MAMMAPLASTY Bilateral 2000   TONSILECTOMY/ADENOIDECTOMY WITH MYRINGOTOMY      Social History[3]  Family History  Problem Relation Age of Onset   Thyroid  disease Mother    Hypertension Sister    Miscarriages / Stillbirths Sister    Stroke Sister    Diabetes Father    Prostate cancer Paternal Grandfather    Cancer Paternal Grandfather        Prostate        07/22/2023    2:10 PM 06/10/2023    2:45 PM 02/07/2023    2:30 PM 11/07/2022    3:21 PM  GAD 7 : Generalized Anxiety Score  Nervous, Anxious, on Edge 0 0 0 0  Control/stop worrying 0 0 0 0  Worry too much - different things 0 0 0 0  Trouble relaxing 0 0 0 0  Restless 0 0 0 0  Easily annoyed or irritable 0 0 0 0  Afraid - awful might happen 0 0 0 0  Total  GAD 7 Score 0 0 0 0  Anxiety Difficulty Not difficult at all Not difficult at all  Not difficult at all       07/22/2023    2:10 PM 06/10/2023    2:45 PM 02/07/2023    2:30 PM  Depression screen PHQ 2/9  Decreased Interest 0 0 0  Down, Depressed, Hopeless 0 0 0  PHQ - 2 Score 0 0 0  Altered sleeping 0 0   Tired, decreased energy 0 0   Change in appetite 0 0   Feeling bad or failure about yourself  0 0   Trouble concentrating 0 0   Moving slowly or fidgety/restless 0 0   Suicidal thoughts 0 0   PHQ-9 Score 0  0    Difficult doing work/chores Not difficult at all Not difficult at all      Data saved with a previous flowsheet row definition    BP Readings from Last 3 Encounters:  02/19/24 100/66  07/22/23 102/68  06/10/23 92/64    Wt Readings from Last 3 Encounters:  07/22/23 197 lb 12.8 oz (89.7 kg)  06/10/23 199 lb 12.8 oz (90.6 kg)  03/19/23 192 lb (87.1 kg)    LMP 04/09/2012   Physical Exam  Recent Labs  Component Value Date/Time   NA 139 07/22/2023 1435   NA 139 02/10/2014 0516   K 4.0 07/22/2023 1435   K 3.8 02/10/2014 0516   CL 107 07/22/2023 1435   CL 103 02/10/2014 0516   CO2 25 07/22/2023 1435   CO2 30 02/10/2014 0516   GLUCOSE 93 07/22/2023 1435   GLUCOSE 76 02/10/2014 0516   BUN 14 07/22/2023 1435   BUN 5 (L) 02/10/2014 0516   CREATININE 0.84 07/22/2023 1435   CALCIUM 8.9 07/22/2023 1435   CALCIUM 7.9 (L) 02/10/2014 0516   PROT 6.6 07/22/2023 1435   PROT 4.8 (L) 02/09/2014 0811   ALBUMIN 4.1 10/21/2019 1019   ALBUMIN 2.4 (L) 02/09/2014 0811   AST 13 07/22/2023 1435   AST 32 02/09/2014 0811   ALT 13 07/22/2023 1435   ALT 18 02/09/2014 0811   ALKPHOS 85 10/21/2019 1019   ALKPHOS 48 02/09/2014 0811   BILITOT 0.4 07/22/2023 1435   BILITOT 0.7 02/09/2014 0811   GFRNONAA 69 11/18/2018 0000   GFRAA 80 11/18/2018 0000    Lab Results  Component Value Date   WBC 7.8 07/22/2023   HGB 11.6 (L) 07/22/2023   HCT 35.6 07/22/2023   MCV 84.8  07/22/2023   PLT 246 07/22/2023   Lab Results  Component Value Date   HGBA1C 5.3 02/07/2023   HGBA1C 5.4 01/03/2022   HGBA1C 5.1 06/09/2020   Lab Results  Component Value Date   CHOL 153 02/07/2023   HDL 47 (L) 02/07/2023   LDLCALC 94 02/07/2023   TRIG 41 02/07/2023   CHOLHDL 3.3 02/07/2023   Lab Results  Component Value Date   TSH 1.41 06/09/2020      Assessment and Plan:  There are no diagnoses linked to this encounter.   No follow-ups on file.    Rolan Hoyle, PA-C, DMSc, DipACLM, Nutritionist [address] [facility] [phone]      [1]  No outpatient medications have been marked as taking for the 02/20/24 encounter (Appointment) with Lemon Raisin, MD.  [2] No Known Allergies [3]  Social History Tobacco Use   Smoking status: Never   Smokeless tobacco: Never  Vaping Use   Vaping status: Never Used  Substance Use Topics   Alcohol use: Yes    Alcohol/week: 0.0 standard drinks of alcohol    Comment: Rare   Drug use: No   "

## 2024-03-11 ENCOUNTER — Ambulatory Visit: Admitting: Internal Medicine

## 2024-03-11 NOTE — Progress Notes (Unsigned)
 "  Subjective:    Patient ID: Alexis Suarez, female    DOB: 1976/02/04, 49 y.o.   MRN: [id number]  HPI       Review of Systems   Past Medical History:  Diagnosis Date   Asthma    ED eval in the past   Sciatica     Current Outpatient Medications  Medication Sig Dispense Refill   Phentermine -Topiramate  ER 15-92 MG CP24 Take 1 capsule by mouth daily. 30 capsule 0   No current facility-administered medications for this visit.    No Known Allergies  Family History  Problem Relation Age of Onset   Thyroid  disease Mother    Hypertension Sister    Miscarriages / Stillbirths Sister    Stroke Sister    Diabetes Father    Prostate cancer Paternal Grandfather    Cancer Paternal Grandfather        Prostate    Social History   Socioeconomic History   Marital status: Single    Spouse name: Not on file   Number of children: 1   Years of education: Not on file   Highest education level: Bachelor's degree (e.g., BA, AB, BS)  Occupational History   Not on file  Tobacco Use   Smoking status: Never   Smokeless tobacco: Never  Vaping Use   Vaping status: Never Used  Substance and Sexual Activity   Alcohol use: Yes    Alcohol/week: 0.0 standard drinks of alcohol    Comment: Rare   Drug use: No   Sexual activity: Yes    Partners: Male  Other Topics Concern   Not on file  Social History Narrative   Lives in [place] with mother, son and boyfriend in a one story home. Works for a teaching laboratory technician.      Education: college degree and 1 year of grad school.      Diet - regular diet            Social Drivers of Health   Tobacco Use: Low Risk (02/20/2024)   Patient History    Smoking Tobacco Use: Never    Smokeless Tobacco Use: Never    Passive Exposure: Not on file  Financial Resource Strain: Low Risk (02/20/2024)   Overall Financial Resource Strain (CARDIA)    Difficulty of Paying Living Expenses: Not hard at all  Food Insecurity: No Food Insecurity  (02/20/2024)   Epic    Worried About Programme Researcher, Broadcasting/film/video in the Last Year: Never true    Ran Out of Food in the Last Year: Never true  Transportation Needs: No Transportation Needs (02/20/2024)   Epic    Lack of Transportation (Medical): No    Lack of Transportation (Non-Medical): No  Physical Activity: Insufficiently Active (02/20/2024)   Exercise Vital Sign    Days of Exercise per Week: 2 days    Minutes of Exercise per Session: 20 min  Stress: No Stress Concern Present (02/20/2024)   [employer] of Occupational Health - Occupational Stress Questionnaire    Feeling of Stress: Only a little  Social Connections: Moderately Integrated (02/20/2024)   Social Connection and Isolation Panel    Frequency of Communication with Friends and Family: More than three times a week    Frequency of Social Gatherings with Friends and Family: More than three times a week    Attends Religious Services: More than 4 times per year    Active Member of [employer] or Organizations: Yes    Attends Club or  Organization Meetings: 1 to 4 times per year    Marital Status: Never married  Intimate Partner Violence: Not At Risk (02/07/2023)   Humiliation, Afraid, Rape, and Kick questionnaire    Fear of Current or Ex-Partner: No    Emotionally Abused: No    Physically Abused: No    Sexually Abused: No  Depression (PHQ2-9): Low Risk (02/20/2024)   Depression (PHQ2-9)    PHQ-2 Score: 0  Alcohol Screen: Low Risk (02/20/2024)   Alcohol Screen    Last Alcohol Screening Score (AUDIT): 1  Housing: Unknown (02/20/2024)   Epic    Unable to Pay for Housing in the Last Year: No    Number of Times Moved in the Last Year: Not on file    Homeless in the Last Year: No  Utilities: Not on file  Health Literacy: Not on file     Constitutional: Denies fever, malaise, fatigue, headache or abrupt weight changes.  HEENT: Denies eye pain, eye redness, ear pain, ringing in the ears, wax buildup, runny nose, nasal congestion,  bloody nose, or sore throat. Respiratory: Denies difficulty breathing, shortness of breath, cough or sputum production.   Cardiovascular: Denies chest pain, chest tightness, palpitations or swelling in the hands or feet.  Gastrointestinal: Denies abdominal pain, bloating, constipation, diarrhea or blood in the stool.  GU: Denies urgency, frequency, pain with urination, burning sensation, blood in urine, odor or discharge. Musculoskeletal: Denies decrease in range of motion, difficulty with gait, muscle pain or joint pain and swelling.  Skin: Pt reports left breast pain. Denies redness, rashes, lesions or ulcercations.  Neurological: Denies dizziness, difficulty with memory, difficulty with speech or problems with balance and coordination.  Psych: Denies anxiety, depression, SI/HI.  No other specific complaints in a complete review of systems (except as listed in HPI above).      Objective:   Physical Exam  LMP 04/09/2012   Wt Readings from Last 3 Encounters:  07/22/23 197 lb 12.8 oz (89.7 kg)  06/10/23 199 lb 12.8 oz (90.6 kg)  03/19/23 192 lb (87.1 kg)    General: Appears her stated age, obese, in NAD. Skin: Warm, dry and intact. No rashes, lesions or ulcerations noted. HEENT: Head: normal shape and size; Eyes: sclera white, no icterus, conjunctiva pink, PERRLA and EOMs intact;  Neck:  Neck supple, trachea midline. No masses, lumps or thyromegaly present.  Cardiovascular: Normal rate and rhythm. S1,S2 noted.  No murmur, rubs or gallops noted. No JVD or BLE edema.  Pulmonary/Chest: Normal effort and positive vesicular breath sounds. No respiratory distress. No wheezes, rales or ronchi noted.  Musculoskeletal:  No difficulty with gait.  Neurological: Alert and oriented. Coordination normal.  Psychiatric: Mood and affect normal. Behavior is normal. Judgment and thought content normal.    BMET    Component Value Date/Time   NA 139 07/22/2023 1435   NA 139 02/10/2014 0516   K 4.0  07/22/2023 1435   K 3.8 02/10/2014 0516   CL 107 07/22/2023 1435   CL 103 02/10/2014 0516   CO2 25 07/22/2023 1435   CO2 30 02/10/2014 0516   GLUCOSE 93 07/22/2023 1435   GLUCOSE 76 02/10/2014 0516   BUN 14 07/22/2023 1435   BUN 5 (L) 02/10/2014 0516   CREATININE 0.84 07/22/2023 1435   CALCIUM 8.9 07/22/2023 1435   CALCIUM 7.9 (L) 02/10/2014 0516   GFRNONAA 69 11/18/2018 0000   GFRAA 80 11/18/2018 0000    Lipid Panel     Component Value Date/Time  CHOL 153 02/07/2023 1438   TRIG 41 02/07/2023 1438   HDL 47 (L) 02/07/2023 1438   CHOLHDL 3.3 02/07/2023 1438   VLDL 10.0 09/24/2017 1539   LDLCALC 94 02/07/2023 1438    CBC    Component Value Date/Time   WBC 7.8 07/22/2023 1435   RBC 4.20 07/22/2023 1435   HGB 11.6 (L) 07/22/2023 1435   HGB 8.4 (L) 02/10/2014 1750   HCT 35.6 07/22/2023 1435   HCT 25.0 (L) 02/10/2014 0516   PLT 246 07/22/2023 1435   PLT 125 (L) 02/10/2014 0516   MCV 84.8 07/22/2023 1435   MCV 87 02/10/2014 0516   MCH 27.6 07/22/2023 1435   MCHC 32.6 07/22/2023 1435   RDW 12.0 07/22/2023 1435   RDW 14.4 02/10/2014 0516   LYMPHSABS 2.2 10/21/2019 1019   LYMPHSABS 1.6 02/10/2014 0516   MONOABS 0.6 10/21/2019 1019   MONOABS 0.9 02/10/2014 0516   EOSABS 476 07/22/2023 1435   EOSABS 0.2 02/10/2014 0516   BASOSABS 39 07/22/2023 1435   BASOSABS 0.0 02/10/2014 0516    Hgb A1C Lab Results  Component Value Date   HGBA1C 5.3 02/07/2023            Assessment & Plan:     Schedule an appointment for your annual exam Angeline Laura, NP  "

## 2024-04-01 ENCOUNTER — Encounter: Payer: Self-pay | Admitting: Internal Medicine
